# Patient Record
Sex: Female | Born: 1960 | Race: Black or African American | Hispanic: No | Marital: Single | Smoking: Never smoker
Health system: Southern US, Community
[De-identification: ages and names within clinical notes are randomized; demographics above are authoritative.]

## PROBLEM LIST (undated history)

## (undated) DIAGNOSIS — G43909 Migraine, unspecified, not intractable, without status migrainosus: Secondary | ICD-10-CM

## (undated) DIAGNOSIS — S8001XA Contusion of right knee, initial encounter: Secondary | ICD-10-CM

## (undated) DIAGNOSIS — F419 Anxiety disorder, unspecified: Secondary | ICD-10-CM

## (undated) DIAGNOSIS — E7800 Pure hypercholesterolemia, unspecified: Secondary | ICD-10-CM

## (undated) DIAGNOSIS — K219 Gastro-esophageal reflux disease without esophagitis: Secondary | ICD-10-CM

## (undated) DIAGNOSIS — I10 Essential (primary) hypertension: Secondary | ICD-10-CM

## (undated) HISTORY — PX: KNEE SURGERY: SHX244

## (undated) HISTORY — PX: HAND SURGERY: SHX662

## (undated) HISTORY — PX: ABDOMINAL HYSTERECTOMY: SHX81

## (undated) HISTORY — PX: CARPAL TUNNEL RELEASE: SHX101

## (undated) HISTORY — PX: CHOLECYSTECTOMY: SHX55

## (undated) HISTORY — PX: TONSILLECTOMY: SUR1361

---

## 2011-01-19 ENCOUNTER — Emergency Department (HOSPITAL_BASED_OUTPATIENT_CLINIC_OR_DEPARTMENT_OTHER)
Admission: EM | Admit: 2011-01-19 | Discharge: 2011-01-19 | Disposition: A | Discharge status: Home / Self Care | Payer: Medicaid Other | Attending: Emergency Medicine | Admitting: Emergency Medicine

## 2011-01-19 ENCOUNTER — Emergency Department (INDEPENDENT_AMBULATORY_CARE_PROVIDER_SITE_OTHER): Payer: Medicaid Other

## 2011-01-19 DIAGNOSIS — M1990 Unspecified osteoarthritis, unspecified site: Secondary | ICD-10-CM

## 2011-01-19 DIAGNOSIS — M25569 Pain in unspecified knee: Secondary | ICD-10-CM | POA: Insufficient documentation

## 2011-01-19 DIAGNOSIS — I10 Essential (primary) hypertension: Secondary | ICD-10-CM | POA: Insufficient documentation

## 2011-01-19 DIAGNOSIS — M1710 Unilateral primary osteoarthritis, unspecified knee: Secondary | ICD-10-CM

## 2011-01-19 DIAGNOSIS — E7800 Pure hypercholesterolemia, unspecified: Secondary | ICD-10-CM | POA: Insufficient documentation

## 2011-01-19 DIAGNOSIS — E119 Type 2 diabetes mellitus without complications: Secondary | ICD-10-CM | POA: Insufficient documentation

## 2011-01-19 HISTORY — DX: Essential (primary) hypertension: I10

## 2011-01-19 HISTORY — DX: Pure hypercholesterolemia, unspecified: E78.00

## 2011-01-19 MED ORDER — OXYCODONE-ACETAMINOPHEN 5-325 MG PO TABS
1.0000 | ORAL_TABLET | Freq: Once | ORAL | Status: AC
  Administered 2011-01-19: 1 via ORAL
  Filled 2011-01-19: qty 1

## 2011-01-19 MED ORDER — OXYCODONE-ACETAMINOPHEN 5-325 MG PO TABS: 2.0000 | ORAL_TABLET | ORAL | Status: AC | PRN

## 2011-01-19 NOTE — ED Provider Notes (Signed)
History     CSN: [id number] Arrival date & time: 01/19/2011  8:24 PM   First MD Initiated Contact with Patient 01/19/11 2035      Chief Complaint  Patient presents with  . Knee Injury   patient states she injured her knee, 3-4 days ago, when she "stepped wrong." Going down a step. She has been having persistent pain in the top of the left knee and diffusely in the left knee. Over that past few days. She is taking Tylenol with mild relief. She denies any numbness or tingling or weakness. She does have chronic arthritis in her right knee.  (Consider location/radiation/quality/duration/timing/severity/associated sxs/prior treatment) HPI  Past Medical History  Diagnosis Date  . High cholesterol   . Diabetes mellitus   . Hypertension     Past Surgical History  Procedure Date  . Knee surgery   . Tonsillectomy   . Carpal tunnel release   . Abdominal hysterectomy   . Cholecystectomy     No family history on file.  History  Substance Use Topics  . Smoking status: Never Smoker   . Smokeless tobacco: Not on file  . Alcohol Use: No    OB History    Grav Para Term Preterm Abortions TAB SAB Ect Mult Living                  Review of Systems  All other systems reviewed and are negative.    Allergies  Peanut oil  Home Medications   Current Outpatient Rx  Name Route Sig Dispense Refill  . ACETAMINOPHEN 500 MG PO TABS Oral Take 2,000 mg by mouth once.      Marland Kitchen LIPITOR PO Oral Take 1 tablet by mouth daily.      Marland Kitchen METFORMIN HCL 500 MG PO TABS Oral Take 500 mg by mouth daily with breakfast.        BP 157/95  Pulse 99  Temp(Src) 97.6 F (36.4 C) (Oral)  Resp 16  Ht 5\' 6"  (1.676 m)  Wt 262 lb (118.842 kg)  BMI 42.29 kg/m2  SpO2 100%  Physical Exam  Constitutional: She appears well-developed and well-nourished.  HENT:  Head: Normocephalic.  Eyes: Pupils are equal, round, and reactive to light.  Cardiovascular: Normal heart sounds.   Pulmonary/Chest: Breath sounds  normal.  Abdominal: Soft.  Musculoskeletal: Normal range of motion.  Neurological: She is alert.  Skin: Skin is warm and dry.    ED Course  Procedures (including critical care time)  Labs Reviewed - No data to display No results found.   No diagnosis found.    MDM  Pt is seen and examined;  Initial history and physical completed.  Will follow.          Antonia Jicha A. Patrica Duel, MD 01/19/11 2040

## 2011-01-19 NOTE — ED Notes (Signed)
Injured left knee 4 days ago-"stepped wrong"-no direct contact

## 2011-01-19 NOTE — ED Notes (Signed)
Pt d/c home with rx x 1 for percocet

## 2011-02-03 ENCOUNTER — Encounter (HOSPITAL_BASED_OUTPATIENT_CLINIC_OR_DEPARTMENT_OTHER): Payer: Self-pay | Admitting: Emergency Medicine

## 2011-02-03 ENCOUNTER — Emergency Department (HOSPITAL_BASED_OUTPATIENT_CLINIC_OR_DEPARTMENT_OTHER)
Admission: EM | Admit: 2011-02-03 | Discharge: 2011-02-03 | Disposition: A | Discharge status: Home / Self Care | Payer: Medicaid Other | Attending: Emergency Medicine | Admitting: Emergency Medicine

## 2011-02-03 DIAGNOSIS — M549 Dorsalgia, unspecified: Secondary | ICD-10-CM | POA: Insufficient documentation

## 2011-02-03 DIAGNOSIS — Z79899 Other long term (current) drug therapy: Secondary | ICD-10-CM | POA: Insufficient documentation

## 2011-02-03 DIAGNOSIS — I10 Essential (primary) hypertension: Secondary | ICD-10-CM | POA: Insufficient documentation

## 2011-02-03 DIAGNOSIS — E7800 Pure hypercholesterolemia, unspecified: Secondary | ICD-10-CM | POA: Insufficient documentation

## 2011-02-03 DIAGNOSIS — E119 Type 2 diabetes mellitus without complications: Secondary | ICD-10-CM | POA: Insufficient documentation

## 2011-02-03 MED ORDER — KETOROLAC TROMETHAMINE 60 MG/2ML IM SOLN
60.0000 mg | Freq: Once | INTRAMUSCULAR | Status: AC
  Administered 2011-02-03: 60 mg via INTRAMUSCULAR
  Filled 2011-02-03: qty 2

## 2011-02-03 MED ORDER — HYDROMORPHONE HCL PF 2 MG/ML IJ SOLN
2.0000 mg | Freq: Once | INTRAMUSCULAR | Status: AC
  Administered 2011-02-03: 2 mg via INTRAMUSCULAR
  Filled 2011-02-03: qty 1

## 2011-02-03 MED ORDER — OXYCODONE-ACETAMINOPHEN 5-325 MG PO TABS
1.0000 | ORAL_TABLET | Freq: Four times a day (QID) | ORAL | Status: AC | PRN
Start: 2011-02-03 — End: 2011-02-13

## 2011-02-03 NOTE — ED Provider Notes (Signed)
History     CSN: [id number] Arrival date & time: 02/03/2011  2:19 AM   First MD Initiated Contact with Patient 02/03/11 0305      Chief Complaint  Patient presents with  . Back Pain    (Consider location/radiation/quality/duration/timing/severity/associated sxs/prior treatment) Patient is a 50 y.o. female presenting with back pain. The history is provided by the patient.  Back Pain  This is a recurrent problem. The current episode started more than 2 days ago. The problem occurs constantly. The problem has been gradually worsening. The pain is associated with no known injury. The pain is present in the lumbar spine. The quality of the pain is described as stabbing, shooting and aching. The pain radiates to the right thigh. The pain is at a severity of 7/10. The pain is moderate. The symptoms are aggravated by bending, twisting and certain positions. The pain is the same all the time. Stiffness is present all day. Associated symptoms include leg pain. Pertinent negatives include no fever, no numbness, no abdominal pain, no bowel incontinence, no bladder incontinence, no dysuria, no paresthesias, no paresis and no weakness.    Past Medical History  Diagnosis Date  . High cholesterol   . Diabetes mellitus   . Hypertension   . Gout     Past Surgical History  Procedure Date  . Knee surgery   . Tonsillectomy   . Carpal tunnel release   . Abdominal hysterectomy   . Cholecystectomy     No family history on file.  History  Substance Use Topics  . Smoking status: Never Smoker   . Smokeless tobacco: Not on file  . Alcohol Use: No    OB History    Grav Para Term Preterm Abortions TAB SAB Ect Mult Living                  Review of Systems  Constitutional: Negative for fever.  Gastrointestinal: Negative for abdominal pain and bowel incontinence.  Genitourinary: Negative for bladder incontinence and dysuria.  Musculoskeletal: Positive for back pain.  Neurological: Negative  for weakness, numbness and paresthesias.  All other systems reviewed and are negative.    Allergies  Peanut oil  Home Medications   Current Outpatient Rx  Name Route Sig Dispense Refill  . COLCHICINE 0.6 MG PO TABS Oral Take 0.6 mg by mouth daily.      . ACETAMINOPHEN 500 MG PO TABS Oral Take 2,000 mg by mouth once.      Marland Kitchen LIPITOR PO Oral Take 1 tablet by mouth daily.      Marland Kitchen METFORMIN HCL 500 MG PO TABS Oral Take 500 mg by mouth daily with breakfast.      . OXYCODONE-ACETAMINOPHEN 5-325 MG PO TABS Oral Take 1-2 tablets by mouth every 6 (six) hours as needed for pain. 15 tablet 0    BP 145/63  Pulse 77  Temp(Src) 97.4 F (36.3 C) (Oral)  Resp 18  SpO2 100%  Physical Exam  Nursing note and vitals reviewed. Constitutional: She is oriented to person, place, and time. She appears well-developed and well-nourished. She appears distressed.  HENT:  Head: Normocephalic and atraumatic.  Mouth/Throat: Oropharynx is clear and moist.  Eyes: EOM are normal. Pupils are equal, round, and reactive to light.  Neck: Normal range of motion. Neck supple. No spinous process tenderness and no muscular tenderness present. No rigidity. Normal range of motion present.  Cardiovascular: Normal rate, regular rhythm, normal heart sounds and intact distal pulses.   No murmur  heard. Pulmonary/Chest: Effort normal and breath sounds normal. She has no wheezes. She has no rales.  Abdominal: Soft. She exhibits no distension. There is no tenderness. There is no CVA tenderness.  Musculoskeletal: She exhibits no tenderness.       Lumbar back: She exhibits decreased range of motion, tenderness and pain. She exhibits no swelling, no deformity and normal pulse.  Neurological: She is alert and oriented to person, place, and time. Coordination normal.  Reflex Scores:      Patellar reflexes are 1+ on the right side and 1+ on the left side. Skin: Skin is warm and dry. No rash noted.  Psychiatric: She has a normal mood  and affect.    ED Course  Procedures (including critical care time)  Labs Reviewed - No data to display No results found.   1. Back pain       MDM   Pt with gradual onset of back pain suggestive of radiculopathy.  No neurovascular compromise and no incontinence.  Pt has no infectious sx, hx of CA  or other red flags concerning for pathologic back pain.  Pt is able to ambulate but is painful.  Normal strength and reflexes on exam.  Denies trauma. Will give pt pain control and to return for developement of above sx.        Gwyneth Sprout, MD 02/03/11 [phone]

## 2011-02-03 NOTE — ED Notes (Signed)
Pt c/o lower back pain x several days, no known injury

## 2011-04-24 ENCOUNTER — Emergency Department (INDEPENDENT_AMBULATORY_CARE_PROVIDER_SITE_OTHER): Payer: Medicaid Other

## 2011-04-24 ENCOUNTER — Encounter (HOSPITAL_BASED_OUTPATIENT_CLINIC_OR_DEPARTMENT_OTHER): Payer: Self-pay

## 2011-04-24 ENCOUNTER — Emergency Department (HOSPITAL_BASED_OUTPATIENT_CLINIC_OR_DEPARTMENT_OTHER)
Admission: EM | Admit: 2011-04-24 | Discharge: 2011-04-24 | Disposition: A | Discharge status: Home / Self Care | Payer: Medicaid Other | Attending: Emergency Medicine | Admitting: Emergency Medicine

## 2011-04-24 DIAGNOSIS — M79609 Pain in unspecified limb: Secondary | ICD-10-CM

## 2011-04-24 DIAGNOSIS — I10 Essential (primary) hypertension: Secondary | ICD-10-CM | POA: Insufficient documentation

## 2011-04-24 DIAGNOSIS — S63501A Unspecified sprain of right wrist, initial encounter: Secondary | ICD-10-CM

## 2011-04-24 DIAGNOSIS — X500XXA Overexertion from strenuous movement or load, initial encounter: Secondary | ICD-10-CM | POA: Insufficient documentation

## 2011-04-24 DIAGNOSIS — E7800 Pure hypercholesterolemia, unspecified: Secondary | ICD-10-CM | POA: Insufficient documentation

## 2011-04-24 DIAGNOSIS — IMO0002 Reserved for concepts with insufficient information to code with codable children: Secondary | ICD-10-CM | POA: Insufficient documentation

## 2011-04-24 DIAGNOSIS — Y92009 Unspecified place in unspecified non-institutional (private) residence as the place of occurrence of the external cause: Secondary | ICD-10-CM | POA: Insufficient documentation

## 2011-04-24 DIAGNOSIS — X58XXXA Exposure to other specified factors, initial encounter: Secondary | ICD-10-CM

## 2011-04-24 DIAGNOSIS — S59909A Unspecified injury of unspecified elbow, initial encounter: Secondary | ICD-10-CM

## 2011-04-24 DIAGNOSIS — M25539 Pain in unspecified wrist: Secondary | ICD-10-CM

## 2011-04-24 DIAGNOSIS — E119 Type 2 diabetes mellitus without complications: Secondary | ICD-10-CM | POA: Insufficient documentation

## 2011-04-24 MED ORDER — HYDROCODONE-ACETAMINOPHEN 5-325 MG PO TABS: 2.0000 | ORAL_TABLET | ORAL | Status: AC | PRN

## 2011-04-24 NOTE — ED Notes (Signed)
Pt states that she has severe pain in her R lower forearm which is radiating to her R hand.  Pt states that last week she was lifting boxes at her home and injured this arm.  Pt states that she has been putting rubbing alcohol on it to help with the pain.

## 2011-04-24 NOTE — ED Provider Notes (Signed)
History     CSN: [id number]  Arrival date & time 04/24/11  1558   First MD Initiated Contact with Patient 04/24/11 1842      Chief Complaint  Patient presents with  . Arm Injury    (Consider location/radiation/quality/duration/timing/severity/associated sxs/prior treatment) Patient is a 51 y.o. female presenting with arm injury. The history is provided by the patient.  Arm Injury  The incident occurred more than 2 days ago. The incident occurred at home. The injury mechanism was a pulled limb and a pulled muscle. There is an injury to the right forearm and right hand. Pertinent negatives include no inability to bear weight. There have been no prior injuries to these areas. She is right-handed. Her tetanus status is UTD. There were no sick contacts.  Pt reports she was lifting boxes and now has pain in her wrist and forearm.  Pt complains of pain with movement.  Pt reports she thinks she may have pulled a muscle  Past Medical History  Diagnosis Date  . High cholesterol   . Diabetes mellitus   . Hypertension   . Gout     Past Surgical History  Procedure Date  . Knee surgery   . Tonsillectomy   . Carpal tunnel release   . Abdominal hysterectomy   . Cholecystectomy   . Hand surgery     History reviewed. No pertinent family history.  History  Substance Use Topics  . Smoking status: Never Smoker   . Smokeless tobacco: Not on file  . Alcohol Use: No    OB History    Grav Para Term Preterm Abortions TAB SAB Ect Mult Living                  Review of Systems  Musculoskeletal: Positive for myalgias and joint swelling.  All other systems reviewed and are negative.    Allergies  Peanut oil  Home Medications   Current Outpatient Rx  Name Route Sig Dispense Refill  . ACETAMINOPHEN 500 MG PO TABS Oral Take 2,000 mg by mouth 2 (two) times daily as needed. For pain    . LIPITOR PO Oral Take 1 tablet by mouth daily.      . COLCHICINE 0.6 MG PO TABS Oral Take 0.6 mg  by mouth daily.      Marland Kitchen METFORMIN HCL 500 MG PO TABS Oral Take 500 mg by mouth daily with breakfast.      . HYDROCODONE-ACETAMINOPHEN 5-325 MG PO TABS Oral Take 2 tablets by mouth every 4 (four) hours as needed for pain. 10 tablet 0    BP 154/85  Pulse 89  Temp(Src) 98.1 F (36.7 C) (Oral)  Resp 19  Ht 5\' 6"  (1.676 m)  Wt 264 lb (119.75 kg)  BMI 42.61 kg/m2  SpO2 100%  Physical Exam  Nursing note and vitals reviewed. Constitutional: She is oriented to person, place, and time. She appears well-developed and well-nourished.  HENT:  Head: Normocephalic and atraumatic.  Musculoskeletal: She exhibits edema and tenderness.       Pain with pronation and supination of forearm,  Elbow and wrist nontender  Neurological: She is alert and oriented to person, place, and time. She has normal reflexes.  Skin: Skin is warm.  Psychiatric: She has a normal mood and affect.    ED Course  Procedures (including critical care time)  Labs Reviewed - No data to display Dg Forearm Right  04/24/2011  *RADIOLOGY REPORT*  Clinical Data: Pain  RIGHT FOREARM - 2 VIEW  Comparison: None.  Findings: Two views of the right forearm submitted.  No acute fracture or subluxation.  No radiopaque foreign body.  IMPRESSION: No acute fracture or subluxation.  Original Report Authenticated By: Natasha Mead, M.D.   Dg Wrist Complete Right  04/24/2011  *RADIOLOGY REPORT*  Clinical Data: Pain, injury  RIGHT WRIST - COMPLETE 3+ VIEW  Comparison: None.  Findings: Four views of the right wrist submitted.  No acute fracture or subluxation.  No radiopaque foreign body.  IMPRESSION: No acute fracture or subluxation.  Original Report Authenticated By: Natasha Mead, M.D.     1. Sprain of right forearm       MDM    Pt placed in sling.  I advised folllow up with Dr. Pearletha Forge for recheck.      Langston Masker, [place] 04/24/11 2241

## 2011-04-25 NOTE — ED Provider Notes (Signed)
Medical screening examination/treatment/procedure(s) were performed by non-physician practitioner and as supervising physician I was immediately available for consultation/collaboration.   Leigh-Ann Jaicob Dia, MD 04/25/11 0055 

## 2013-01-23 ENCOUNTER — Emergency Department (HOSPITAL_BASED_OUTPATIENT_CLINIC_OR_DEPARTMENT_OTHER)
Admission: EM | Admit: 2013-01-23 | Discharge: 2013-01-23 | Disposition: A | Discharge status: Home / Self Care | Payer: Medicaid Other | Attending: Emergency Medicine | Admitting: Emergency Medicine

## 2013-01-23 ENCOUNTER — Encounter (HOSPITAL_BASED_OUTPATIENT_CLINIC_OR_DEPARTMENT_OTHER): Payer: Self-pay | Admitting: Emergency Medicine

## 2013-01-23 DIAGNOSIS — M109 Gout, unspecified: Secondary | ICD-10-CM | POA: Insufficient documentation

## 2013-01-23 DIAGNOSIS — Z79899 Other long term (current) drug therapy: Secondary | ICD-10-CM | POA: Insufficient documentation

## 2013-01-23 DIAGNOSIS — G43909 Migraine, unspecified, not intractable, without status migrainosus: Secondary | ICD-10-CM | POA: Insufficient documentation

## 2013-01-23 DIAGNOSIS — I10 Essential (primary) hypertension: Secondary | ICD-10-CM | POA: Insufficient documentation

## 2013-01-23 DIAGNOSIS — E119 Type 2 diabetes mellitus without complications: Secondary | ICD-10-CM | POA: Insufficient documentation

## 2013-01-23 DIAGNOSIS — Z791 Long term (current) use of non-steroidal anti-inflammatories (NSAID): Secondary | ICD-10-CM | POA: Insufficient documentation

## 2013-01-23 DIAGNOSIS — E7800 Pure hypercholesterolemia, unspecified: Secondary | ICD-10-CM | POA: Insufficient documentation

## 2013-01-23 HISTORY — DX: Migraine, unspecified, not intractable, without status migrainosus: G43.909

## 2013-01-23 MED ORDER — SODIUM CHLORIDE 0.9 % IV BOLUS (SEPSIS)
1000.0000 mL | Freq: Once | INTRAVENOUS | Status: AC
  Administered 2013-01-23: 1000 mL via INTRAVENOUS

## 2013-01-23 MED ORDER — METOCLOPRAMIDE HCL 5 MG/ML IJ SOLN
10.0000 mg | Freq: Once | INTRAMUSCULAR | Status: AC
  Administered 2013-01-23: 10 mg via INTRAVENOUS
  Filled 2013-01-23: qty 2

## 2013-01-23 MED ORDER — KETOROLAC TROMETHAMINE 30 MG/ML IJ SOLN
30.0000 mg | Freq: Once | INTRAMUSCULAR | Status: AC
  Administered 2013-01-23: 30 mg via INTRAVENOUS
  Filled 2013-01-23: qty 1

## 2013-01-23 NOTE — ED Provider Notes (Signed)
CSN: [id number]     Arrival date & time 01/23/13  1739 History  This chart was scribed for Tracey Castillo B. Bernette Mayers, MD by Tracey Castillo, ED Scribe. This patient was seen in room MH07/MH07 and the patient's care was started at 6:00 PM.   Chief Complaint  Patient presents with  . Migraine    The history is provided by the patient. No language interpreter was used.    HPI Comments: Tracey Castillo is a 52 y.o. female who presents to the Emergency Department complaining of right sided HA with associated nausea, lightheadedness and photophobia that started gradually 4 days ago and has been persistent. She also reports a recent cough that exacerbates her HA. She has a h/o similar migraines, episodes usually occur 3 times a year; however, she states that she normally does not have the lightheadedness. She states that her last similar HA was 5 months ago. She sees her PCP for her migraines but denies having any prescriptions. She denies any prior ED visits for her HAs stating that they are mostly controlled with OTC medications. She denies any fevers.   Past Medical History  Diagnosis Date  . High cholesterol   . Diabetes mellitus   . Hypertension   . Gout   . Migraine    Past Surgical History  Procedure Laterality Date  . Knee surgery    . Tonsillectomy    . Carpal tunnel release    . Abdominal hysterectomy    . Cholecystectomy    . Hand surgery     History reviewed. No pertinent family history. History  Substance Use Topics  . Smoking status: Never Smoker   . Smokeless tobacco: Not on file  . Alcohol Use: No   No OB history provided.  Review of Systems  A complete 10 system review of systems was obtained and all systems are negative except as noted in the HPI and PMH.   Allergies  Peanut oil  Home Medications   Current Outpatient Rx  Name  Route  Sig  Dispense  Refill  . allopurinol (ZYLOPRIM) 300 MG tablet   Oral   Take 300 mg by mouth daily.         .  cyclobenzaprine (FLEXERIL) 10 MG tablet   Oral   Take 10 mg by mouth 3 (three) times daily as needed for muscle spasms.         Marland Kitchen glyBURIDE-metformin (GLUCOVANCE) 2.5-500 MG per tablet   Oral   Take 1 tablet by mouth daily with breakfast.         . meloxicam (MOBIC) 7.5 MG tablet   Oral   Take 7.5 mg by mouth daily.         Marland Kitchen omeprazole (PRILOSEC) 40 MG capsule   Oral   Take 40 mg by mouth daily.         . pravastatin (PRAVACHOL) 40 MG tablet   Oral   Take 40 mg by mouth daily.         Marland Kitchen zolpidem (AMBIEN CR) 12.5 MG CR tablet   Oral   Take 12.5 mg by mouth at bedtime as needed for sleep.         Marland Kitchen acetaminophen (TYLENOL) 500 MG tablet   Oral   Take 2,000 mg by mouth 2 (two) times daily as needed. For pain         . Atorvastatin Calcium (LIPITOR PO)   Oral   Take 1 tablet by mouth daily.           Marland Kitchen  colchicine 0.6 MG tablet   Oral   Take 0.6 mg by mouth daily.           . metFORMIN (GLUCOPHAGE) 500 MG tablet   Oral   Take 500 mg by mouth daily with breakfast.            Triage Vitals: BP 166/96  Pulse 115  Temp(Src) 98.2 F (36.8 C) (Oral)  Resp 16  Ht 5\' 6"  (1.676 m)  Wt 295 lb (133.811 kg)  BMI 47.64 kg/m2  SpO2 100%  Physical Exam  Nursing note and vitals reviewed. Constitutional: She is oriented to person, place, and time. She appears well-developed and well-nourished.  HENT:  Head: Normocephalic and atraumatic.  Eyes: EOM are normal. Pupils are equal, round, and reactive to light.  Neck: Normal range of motion. Neck supple.  Cardiovascular: Normal rate, normal heart sounds and intact distal pulses.   Pulmonary/Chest: Effort normal and breath sounds normal.  Abdominal: Bowel sounds are normal. She exhibits no distension. There is no tenderness.  Musculoskeletal: Normal range of motion. She exhibits no edema and no tenderness.  Neurological: She is alert and oriented to person, place, and time. She has normal strength. No cranial  nerve deficit or sensory deficit.  Skin: Skin is warm and dry. No rash noted.  Psychiatric: She has a normal mood and affect.    ED Course  Procedures (including critical care time)  Medications  sodium chloride 0.9 % bolus 1,000 mL (not administered)  ketorolac (TORADOL) 30 MG/ML injection 30 mg (not administered)  metoCLOPramide (REGLAN) injection 10 mg (not administered)    DIAGNOSTIC STUDIES: Oxygen Saturation is 100% on room air, normal by my interpretation.    COORDINATION OF CARE: 6:05 PM-Discussed treatment plan which includes medications and IV fluids with pt at bedside and pt agreed to plan.   Labs Review Labs Reviewed - No data to display Imaging Review No results found.  EKG Interpretation   None       MDM   1. Migraine     No red flags for serious cause of headache. Will give meds and IVF and reassess.   7:24 PM Pt feeling better, pain improved and she would like to go home.   I personally performed the services described in this documentation, which was scribed in my presence. The recorded information has been reviewed and is accurate.       Melynda Krzywicki B. Bernette Mayers, MD 01/23/13 1925

## 2013-01-23 NOTE — ED Notes (Addendum)
C/o migraine x 4 days,  w nausea,  Has taken advil with  No relief,  Light sensitive to some degree

## 2013-01-23 NOTE — ED Notes (Signed)
Pt c/o migraine x4 days

## 2013-02-22 ENCOUNTER — Emergency Department (HOSPITAL_BASED_OUTPATIENT_CLINIC_OR_DEPARTMENT_OTHER)
Admission: EM | Admit: 2013-02-22 | Discharge: 2013-02-22 | Disposition: A | Discharge status: Home / Self Care | Payer: Medicaid Other | Attending: Emergency Medicine | Admitting: Emergency Medicine

## 2013-02-22 ENCOUNTER — Encounter (HOSPITAL_BASED_OUTPATIENT_CLINIC_OR_DEPARTMENT_OTHER): Payer: Self-pay | Admitting: Emergency Medicine

## 2013-02-22 DIAGNOSIS — E119 Type 2 diabetes mellitus without complications: Secondary | ICD-10-CM | POA: Insufficient documentation

## 2013-02-22 DIAGNOSIS — Z791 Long term (current) use of non-steroidal anti-inflammatories (NSAID): Secondary | ICD-10-CM | POA: Insufficient documentation

## 2013-02-22 DIAGNOSIS — J069 Acute upper respiratory infection, unspecified: Secondary | ICD-10-CM | POA: Insufficient documentation

## 2013-02-22 DIAGNOSIS — M109 Gout, unspecified: Secondary | ICD-10-CM | POA: Insufficient documentation

## 2013-02-22 DIAGNOSIS — I10 Essential (primary) hypertension: Secondary | ICD-10-CM | POA: Insufficient documentation

## 2013-02-22 DIAGNOSIS — Z79899 Other long term (current) drug therapy: Secondary | ICD-10-CM | POA: Insufficient documentation

## 2013-02-22 DIAGNOSIS — J019 Acute sinusitis, unspecified: Secondary | ICD-10-CM | POA: Insufficient documentation

## 2013-02-22 DIAGNOSIS — E7800 Pure hypercholesterolemia, unspecified: Secondary | ICD-10-CM | POA: Insufficient documentation

## 2013-02-22 DIAGNOSIS — G43909 Migraine, unspecified, not intractable, without status migrainosus: Secondary | ICD-10-CM | POA: Insufficient documentation

## 2013-02-22 MED ORDER — AZITHROMYCIN 250 MG PO TABS: 250.0000 mg | ORAL_TABLET | Freq: Every day | ORAL | Status: DC

## 2013-02-22 NOTE — ED Notes (Signed)
Pt states that she has been having URI symptoms for the past several days. Pt states that she has been having dry cough as well as sore throat and HA. Pt denies respiratory problems.

## 2013-02-22 NOTE — ED Provider Notes (Signed)
CSN: [id number]     Arrival date & time 02/22/13  1800 History   First MD Initiated Contact with Patient 02/22/13 1811     Chief Complaint  Patient presents with  . URI   (Consider location/radiation/quality/duration/timing/severity/associated sxs/prior Treatment) HPI Comments: Patient presents today with a chief complaint of productive cough, sinus pressure, post nasal drip, and congestion.  Symptoms have been present for the past week and are gradually worsening.  She reports that she has been taking Nyquil and Robitussin for symptoms without relief.  She denies chest pain or SOB.  She denies fever or chills.  No history of COPD or Asthma.  She does not smoke.    Patient is a 52 y.o. female presenting with URI. The history is provided by the patient.  URI   Past Medical History  Diagnosis Date  . High cholesterol   . Diabetes mellitus   . Hypertension   . Gout   . Migraine    Past Surgical History  Procedure Laterality Date  . Knee surgery    . Tonsillectomy    . Carpal tunnel release    . Abdominal hysterectomy    . Cholecystectomy    . Hand surgery     History reviewed. No pertinent family history. History  Substance Use Topics  . Smoking status: Never Smoker   . Smokeless tobacco: Not on file  . Alcohol Use: No   OB History   Grav Para Term Preterm Abortions TAB SAB Ect Mult Living                 Review of Systems  All other systems reviewed and are negative.    Allergies  Peanut oil  Home Medications   Current Outpatient Rx  Name  Route  Sig  Dispense  Refill  . acetaminophen (TYLENOL) 500 MG tablet   Oral   Take 2,000 mg by mouth 2 (two) times daily as needed. For pain         . allopurinol (ZYLOPRIM) 300 MG tablet   Oral   Take 300 mg by mouth daily.         . Atorvastatin Calcium (LIPITOR PO)   Oral   Take 1 tablet by mouth daily.           . colchicine 0.6 MG tablet   Oral   Take 0.6 mg by mouth daily.           .  cyclobenzaprine (FLEXERIL) 10 MG tablet   Oral   Take 10 mg by mouth 3 (three) times daily as needed for muscle spasms.         Marland Kitchen glyBURIDE-metformin (GLUCOVANCE) 2.5-500 MG per tablet   Oral   Take 1 tablet by mouth daily with breakfast.         . meloxicam (MOBIC) 7.5 MG tablet   Oral   Take 7.5 mg by mouth daily.         . metFORMIN (GLUCOPHAGE) 500 MG tablet   Oral   Take 500 mg by mouth daily with breakfast.           . omeprazole (PRILOSEC) 40 MG capsule   Oral   Take 40 mg by mouth daily.         . pravastatin (PRAVACHOL) 40 MG tablet   Oral   Take 40 mg by mouth daily.         Marland Kitchen zolpidem (AMBIEN CR) 12.5 MG CR tablet   Oral   Take  12.5 mg by mouth at bedtime as needed for sleep.          BP 168/107  Pulse 103  Temp(Src) 97.9 F (36.6 C) (Oral)  Resp 16  Ht 5\' 6"  (1.676 m)  Wt 299 lb (135.626 kg)  BMI 48.28 kg/m2  SpO2 100% Physical Exam  Nursing note and vitals reviewed. Constitutional: She appears well-developed and well-nourished.  HENT:  Head: Normocephalic and atraumatic.  Right Ear: Tympanic membrane and ear canal normal.  Left Ear: Tympanic membrane and ear canal normal.  Nose: Mucosal edema and rhinorrhea present. Right sinus exhibits frontal sinus tenderness. Right sinus exhibits no maxillary sinus tenderness. Left sinus exhibits frontal sinus tenderness. Left sinus exhibits no maxillary sinus tenderness.  Mouth/Throat: Uvula is midline, oropharynx is clear and moist and mucous membranes are normal.  Neck: Normal range of motion. Neck supple.  Cardiovascular: Normal rate, regular rhythm and normal heart sounds.   Pulmonary/Chest: Effort normal and breath sounds normal. No respiratory distress. She has no wheezes. She has no rales.  Neurological: She is alert.  Skin: Skin is warm and dry.  Psychiatric: She has a normal mood and affect.    ED Course  Procedures (including critical care time) Labs Review Labs Reviewed - No data to  display Imaging Review No results found.  EKG Interpretation   None       MDM  No diagnosis found. Patient with symptoms consistent with Acute Sinusitis.  Pulse ox 100 on RA.  No signs of respiratory distress.  Patient given antibiotic to treat Acute Sinusitis.  Patient stable for discharge.  Return precautions given.    Santiago Glad, PA-C 02/25/13 [phone]

## 2013-02-22 NOTE — ED Notes (Signed)
Pt c/o URI symptoms x 1 week 

## 2013-03-01 NOTE — ED Provider Notes (Signed)
Medical screening examination/treatment/procedure(s) were performed by non-physician practitioner and as supervising physician I was immediately available for consultation/collaboration.  EKG Interpretation   None         Gwyneth Sprout, MD 03/01/13 [phone]

## 2013-03-02 ENCOUNTER — Emergency Department (HOSPITAL_BASED_OUTPATIENT_CLINIC_OR_DEPARTMENT_OTHER): Payer: Medicaid Other

## 2013-03-02 ENCOUNTER — Emergency Department (HOSPITAL_BASED_OUTPATIENT_CLINIC_OR_DEPARTMENT_OTHER)
Admission: EM | Admit: 2013-03-02 | Discharge: 2013-03-02 | Disposition: A | Discharge status: Home / Self Care | Payer: Medicaid Other | Attending: Emergency Medicine | Admitting: Emergency Medicine

## 2013-03-02 ENCOUNTER — Encounter (HOSPITAL_BASED_OUTPATIENT_CLINIC_OR_DEPARTMENT_OTHER): Payer: Self-pay | Admitting: Emergency Medicine

## 2013-03-02 DIAGNOSIS — M25562 Pain in left knee: Secondary | ICD-10-CM

## 2013-03-02 DIAGNOSIS — E119 Type 2 diabetes mellitus without complications: Secondary | ICD-10-CM | POA: Insufficient documentation

## 2013-03-02 DIAGNOSIS — Z792 Long term (current) use of antibiotics: Secondary | ICD-10-CM | POA: Insufficient documentation

## 2013-03-02 DIAGNOSIS — G43909 Migraine, unspecified, not intractable, without status migrainosus: Secondary | ICD-10-CM | POA: Insufficient documentation

## 2013-03-02 DIAGNOSIS — Z79899 Other long term (current) drug therapy: Secondary | ICD-10-CM | POA: Insufficient documentation

## 2013-03-02 DIAGNOSIS — M109 Gout, unspecified: Secondary | ICD-10-CM | POA: Insufficient documentation

## 2013-03-02 DIAGNOSIS — E7800 Pure hypercholesterolemia, unspecified: Secondary | ICD-10-CM | POA: Insufficient documentation

## 2013-03-02 DIAGNOSIS — Z791 Long term (current) use of non-steroidal anti-inflammatories (NSAID): Secondary | ICD-10-CM | POA: Insufficient documentation

## 2013-03-02 DIAGNOSIS — I10 Essential (primary) hypertension: Secondary | ICD-10-CM | POA: Insufficient documentation

## 2013-03-02 DIAGNOSIS — M25569 Pain in unspecified knee: Secondary | ICD-10-CM | POA: Insufficient documentation

## 2013-03-02 MED ORDER — HYDROCODONE-ACETAMINOPHEN 5-325 MG PO TABS: 2.0000 | ORAL_TABLET | ORAL | Status: DC | PRN

## 2013-03-02 NOTE — ED Notes (Signed)
Pt reports (L) knee pain x 2 weeks.  deneis injury. No swelling, bruising noted.  Pt ambulatory.

## 2013-03-02 NOTE — ED Provider Notes (Signed)
CSN: [id number]     Arrival date & time 03/02/13  4098 History   First MD Initiated Contact with Patient 03/02/13 [phone]     Chief Complaint  Patient presents with  . Knee Pain   (Consider location/radiation/quality/duration/timing/severity/associated sxs/prior Treatment) HPI Comments: Patient presents with a two-week history of worsening throbbing left knee pain. She denies any known injury. She has arthritis in her right knee but hasn't had any significant problems with her left knee the past. She does have a history of a torn ligament in her left knee which required surgery several years ago. This was done in [facility]. She denies any significant swelling to the knee. She states it's worse with ambulation. She takes Mobic home which she's been using without significant relief.  Patient is a 52 y.o. female presenting with knee pain.  Knee Pain Associated symptoms: no back pain, no fever and no neck pain     Past Medical History  Diagnosis Date  . High cholesterol   . Diabetes mellitus   . Hypertension   . Gout   . Migraine    Past Surgical History  Procedure Laterality Date  . Knee surgery    . Tonsillectomy    . Carpal tunnel release    . Abdominal hysterectomy    . Cholecystectomy    . Hand surgery     History reviewed. No pertinent family history. History  Substance Use Topics  . Smoking status: Never Smoker   . Smokeless tobacco: Not on file  . Alcohol Use: No   OB History   Grav Para Term Preterm Abortions TAB SAB Ect Mult Living                 Review of Systems  Constitutional: Negative for fever.  Gastrointestinal: Negative for nausea and vomiting.  Musculoskeletal: Positive for arthralgias. Negative for back pain, joint swelling and neck pain.  Skin: Negative for wound.  Neurological: Negative for weakness, numbness and headaches.    Allergies  Peanut oil  Home Medications   Current Outpatient Rx  Name  Route  Sig  Dispense  Refill  .  acetaminophen (TYLENOL) 500 MG tablet   Oral   Take 2,000 mg by mouth 2 (two) times daily as needed. For pain         . allopurinol (ZYLOPRIM) 300 MG tablet   Oral   Take 300 mg by mouth daily.         . Atorvastatin Calcium (LIPITOR PO)   Oral   Take 1 tablet by mouth daily.           Marland Kitchen azithromycin (ZITHROMAX) 250 MG tablet   Oral   Take 1 tablet (250 mg total) by mouth daily. Take first 2 tablets together, then 1 every day until finished.   6 tablet   0   . colchicine 0.6 MG tablet   Oral   Take 0.6 mg by mouth daily.           . cyclobenzaprine (FLEXERIL) 10 MG tablet   Oral   Take 10 mg by mouth 3 (three) times daily as needed for muscle spasms.         Marland Kitchen glyBURIDE-metformin (GLUCOVANCE) 2.5-500 MG per tablet   Oral   Take 1 tablet by mouth daily with breakfast.         . HYDROcodone-acetaminophen (NORCO/VICODIN) 5-325 MG per tablet   Oral   Take 2 tablets by mouth every 4 (four) hours as needed.  15 tablet   0   . meloxicam (MOBIC) 7.5 MG tablet   Oral   Take 7.5 mg by mouth daily.         . metFORMIN (GLUCOPHAGE) 500 MG tablet   Oral   Take 500 mg by mouth daily with breakfast.           . omeprazole (PRILOSEC) 40 MG capsule   Oral   Take 40 mg by mouth daily.         . pravastatin (PRAVACHOL) 40 MG tablet   Oral   Take 40 mg by mouth daily.         Marland Kitchen zolpidem (AMBIEN CR) 12.5 MG CR tablet   Oral   Take 12.5 mg by mouth at bedtime as needed for sleep.          BP 193/89  Pulse 100  Temp(Src) 97.5 F (36.4 C) (Oral)  Resp 18  Ht 5\' 6"  (1.676 m)  Wt 299 lb (135.626 kg)  BMI 48.28 kg/m2 Physical Exam  Constitutional: She is oriented to person, place, and time. She appears well-developed and well-nourished.  HENT:  Head: Normocephalic and atraumatic.  Neck: Normal range of motion. Neck supple.  Cardiovascular: Normal rate.   Pulmonary/Chest: Effort normal.  Musculoskeletal: She exhibits tenderness. She exhibits no  edema.  There is no significant swelling or effusion to the knee. There's no warmth to the knee. She has pain on range of motion and palpation of the left knee. The pain is mostly at the quadriceps tendon and along the joint lines. There some crepitus on range of motion of the knee but the ligaments appeared be grossly intact to Lachman maneuver as well as valgus and varus stress. There is no significant pain to the hip or the ankle. She's neurovascularly intact.  Neurological: She is alert and oriented to person, place, and time.  Skin: Skin is warm and dry.  Psychiatric: She has a normal mood and affect.    ED Course  Procedures (including critical care time) Labs Review Labs Reviewed - No data to display Imaging Review Dg Knee Complete 4 Views Left  03/02/2013   CLINICAL DATA:  Five-day history of acute knee pain. , history of previous left knee surgery  EXAM: LEFT KNEE - COMPLETE 4+ VIEW  COMPARISON:  Knee series of January 19, 2011.  FINDINGS: The bones of the left knee appear adequately mineralized for age. There is chronic beaking of the tibial spines. There is a small osteophyte noted from the periphery of the medial femoral condyle. The tibial plateaus appear intact. The proximal fibula exhibits no acute abnormality. There are degenerative changes of the patellofemoral joint with the femoral and patellar osteophytes. No definite joint effusion is demonstrated  IMPRESSION: 1. There is no acute fracture nor dislocation of the left knee. 2. There is moderate degenerative change involving the patellofemoral joint and mild degenerative change of both medial and lateral joint compartments.   Electronically Signed   By: David  [place]   On: 03/02/2013 09:29    EKG Interpretation   None       MDM   1. Knee pain, acute, left    Patient's pain is likely related to degenerative joint disease. She was advised in ice and elevation. She's currently taking Mobic home. She is given a prescription  for Vicodin as well. She was advised to followup with her primary care physician or Dr. Pearletha Forge if her symptoms are not improving.    Rolan Bucco,  MD 03/02/13 [phone]

## 2013-05-05 ENCOUNTER — Encounter (HOSPITAL_BASED_OUTPATIENT_CLINIC_OR_DEPARTMENT_OTHER): Payer: Self-pay | Admitting: Emergency Medicine

## 2013-05-05 ENCOUNTER — Emergency Department (HOSPITAL_BASED_OUTPATIENT_CLINIC_OR_DEPARTMENT_OTHER)
Admission: EM | Admit: 2013-05-05 | Discharge: 2013-05-05 | Disposition: A | Discharge status: Home / Self Care | Payer: Medicaid Other | Attending: Emergency Medicine | Admitting: Emergency Medicine

## 2013-05-05 ENCOUNTER — Emergency Department (HOSPITAL_BASED_OUTPATIENT_CLINIC_OR_DEPARTMENT_OTHER): Payer: Medicaid Other

## 2013-05-05 DIAGNOSIS — Y92410 Unspecified street and highway as the place of occurrence of the external cause: Secondary | ICD-10-CM | POA: Insufficient documentation

## 2013-05-05 DIAGNOSIS — Z79899 Other long term (current) drug therapy: Secondary | ICD-10-CM | POA: Insufficient documentation

## 2013-05-05 DIAGNOSIS — Z792 Long term (current) use of antibiotics: Secondary | ICD-10-CM | POA: Insufficient documentation

## 2013-05-05 DIAGNOSIS — E119 Type 2 diabetes mellitus without complications: Secondary | ICD-10-CM | POA: Insufficient documentation

## 2013-05-05 DIAGNOSIS — G43909 Migraine, unspecified, not intractable, without status migrainosus: Secondary | ICD-10-CM | POA: Insufficient documentation

## 2013-05-05 DIAGNOSIS — S8990XA Unspecified injury of unspecified lower leg, initial encounter: Secondary | ICD-10-CM | POA: Insufficient documentation

## 2013-05-05 DIAGNOSIS — E7800 Pure hypercholesterolemia, unspecified: Secondary | ICD-10-CM | POA: Insufficient documentation

## 2013-05-05 DIAGNOSIS — Y9389 Activity, other specified: Secondary | ICD-10-CM | POA: Insufficient documentation

## 2013-05-05 DIAGNOSIS — M25561 Pain in right knee: Secondary | ICD-10-CM

## 2013-05-05 DIAGNOSIS — M109 Gout, unspecified: Secondary | ICD-10-CM | POA: Insufficient documentation

## 2013-05-05 DIAGNOSIS — I10 Essential (primary) hypertension: Secondary | ICD-10-CM | POA: Insufficient documentation

## 2013-05-05 DIAGNOSIS — G8929 Other chronic pain: Secondary | ICD-10-CM | POA: Insufficient documentation

## 2013-05-05 DIAGNOSIS — S99929A Unspecified injury of unspecified foot, initial encounter: Principal | ICD-10-CM

## 2013-05-05 DIAGNOSIS — Z791 Long term (current) use of non-steroidal anti-inflammatories (NSAID): Secondary | ICD-10-CM | POA: Insufficient documentation

## 2013-05-05 DIAGNOSIS — S99919A Unspecified injury of unspecified ankle, initial encounter: Principal | ICD-10-CM

## 2013-05-05 MED ORDER — HYDROCODONE-ACETAMINOPHEN 5-325 MG PO TABS: 1.0000 | ORAL_TABLET | ORAL | Status: DC | PRN

## 2013-05-05 MED ORDER — HYDROCODONE-ACETAMINOPHEN 5-325 MG PO TABS
1.0000 | ORAL_TABLET | Freq: Once | ORAL | Status: AC
  Administered 2013-05-05: 1 via ORAL
  Filled 2013-05-05: qty 1

## 2013-05-05 NOTE — ED Provider Notes (Signed)
CSN: [id number]     Arrival date & time 05/05/13  1942 History   First MD Initiated Contact with Patient 05/05/13 2037     Chief Complaint  Patient presents with  . Knee Injury     (Consider location/radiation/quality/duration/timing/severity/associated sxs/prior Treatment) HPI Comments: Patient is 53 year old female with PMHx significant for right knee surgery, HTN, DM and gout who presents to the ED after stepping down from a truck bed and landing hard onto the bottom of her right foot - she reports increase in pain and swelling, she denies redness, numbness, tingling, ankle pain.  The history is provided by the patient. No language interpreter was used.    Past Medical History  Diagnosis Date  . High cholesterol   . Diabetes mellitus   . Hypertension   . Gout   . Migraine    Past Surgical History  Procedure Laterality Date  . Knee surgery    . Tonsillectomy    . Carpal tunnel release    . Abdominal hysterectomy    . Cholecystectomy    . Hand surgery     No family history on file. History  Substance Use Topics  . Smoking status: Never Smoker   . Smokeless tobacco: Not on file  . Alcohol Use: No   OB History   Grav Para Term Preterm Abortions TAB SAB Ect Mult Living                 Review of Systems  All other systems reviewed and are negative.      Allergies  Peanut oil  Home Medications   Current Outpatient Rx  Name  Route  Sig  Dispense  Refill  . acetaminophen (TYLENOL) 500 MG tablet   Oral   Take 2,000 mg by mouth 2 (two) times daily as needed. For pain         . allopurinol (ZYLOPRIM) 300 MG tablet   Oral   Take 300 mg by mouth daily.         . Atorvastatin Calcium (LIPITOR PO)   Oral   Take 1 tablet by mouth daily.           Marland Kitchen azithromycin (ZITHROMAX) 250 MG tablet   Oral   Take 1 tablet (250 mg total) by mouth daily. Take first 2 tablets together, then 1 every day until finished.   6 tablet   0   . colchicine 0.6 MG tablet  Oral   Take 0.6 mg by mouth daily.           . cyclobenzaprine (FLEXERIL) 10 MG tablet   Oral   Take 10 mg by mouth 3 (three) times daily as needed for muscle spasms.         Marland Kitchen glyBURIDE-metformin (GLUCOVANCE) 2.5-500 MG per tablet   Oral   Take 1 tablet by mouth daily with breakfast.         . HYDROcodone-acetaminophen (NORCO/VICODIN) 5-325 MG per tablet   Oral   Take 2 tablets by mouth every 4 (four) hours as needed.   15 tablet   0   . meloxicam (MOBIC) 7.5 MG tablet   Oral   Take 7.5 mg by mouth daily.         . metFORMIN (GLUCOPHAGE) 500 MG tablet   Oral   Take 500 mg by mouth daily with breakfast.           . omeprazole (PRILOSEC) 40 MG capsule   Oral   Take 40  mg by mouth daily.         . pravastatin (PRAVACHOL) 40 MG tablet   Oral   Take 40 mg by mouth daily.         Marland Kitchen. zolpidem (AMBIEN CR) 12.5 MG CR tablet   Oral   Take 12.5 mg by mouth at bedtime as needed for sleep.          BP 154/58  Pulse 115  Temp(Src) 96.6 F (35.9 C) (Oral)  Resp 20  Ht 5\' 6"  (1.676 m)  Wt 299 lb (135.626 kg)  BMI 48.28 kg/m2  SpO2 100% Physical Exam  Nursing note and vitals reviewed. Constitutional: She is oriented to person, place, and time. She appears well-developed and well-nourished. No distress.  HENT:  Head: Normocephalic and atraumatic.  Right Ear: External ear normal.  Left Ear: External ear normal.  Nose: Nose normal.  Mouth/Throat: Oropharynx is clear and moist. No oropharyngeal exudate.  Eyes: Conjunctivae are normal. Pupils are equal, round, and reactive to light. No scleral icterus.  Neck: Normal range of motion. Neck supple.  Cardiovascular: Normal rate, regular rhythm and normal heart sounds.  Exam reveals no gallop and no friction rub.   No murmur heard. Pulmonary/Chest: Effort normal and breath sounds normal. No respiratory distress. She has no wheezes. She has no rales. She exhibits no tenderness.  Abdominal: Soft. Bowel sounds are  normal. She exhibits no distension. There is no tenderness. There is no rebound and no guarding.  Musculoskeletal:       Right knee: She exhibits decreased range of motion, swelling and bony tenderness. She exhibits no effusion, no deformity, no erythema, normal alignment, no LCL laxity and no MCL laxity. Tenderness found. Lateral joint line tenderness noted.       Legs: Lymphadenopathy:    She has no cervical adenopathy.  Neurological: She is alert and oriented to person, place, and time. She exhibits normal muscle tone. Coordination normal.  Skin: Skin is warm and dry. No rash noted. No erythema. No pallor.  Psychiatric: She has a normal mood and affect. Her behavior is normal. Judgment and thought content normal.    ED Course  Procedures (including critical care time) Labs Review Labs Reviewed - No data to display Imaging Review Dg Knee Complete 4 Views Right  05/05/2013   CLINICAL DATA:  Right knee pain and swelling post fall  EXAM: RIGHT KNEE - COMPLETE 4+ VIEW  COMPARISON:  None  FINDINGS: Osseous demineralization.  Tricompartmental osteoarthritic changes with joint space narrowing and spur formation.  Mild lateral subluxation of tibia.  No acute fracture, dislocation or bone destruction.  Probable small knee joint effusion.  IMPRESSION: Advanced tricompartmental osteoarthritic changes of the right knee.  No acute abnormalities.   Electronically Signed   By: Ulyses SouthwardMark  Boles M.D.   On: 05/05/2013 20:24    EKG Interpretation   None       MDM   Right knee pain  Patient here with right knee pain with history of chronic knee pain to the area.  No clinical suspicion for septic arthritis, ligamentous injury.  Will place in ACE and pain control - she will follow up with her orthopedic surgeon.   Izola PriceFrances C. Marisue HumbleSanford, Cordelia Poche-C 05/05/13 2123

## 2013-05-05 NOTE — ED Provider Notes (Signed)
History/physical exam/procedure(s) were performed by non-physician practitioner and as supervising physician I was immediately available for consultation/collaboration. I have reviewed all notes and am in agreement with care and plan.   Hilario Quarryanielle S Shepard Keltz, MD 05/05/13 2127

## 2013-05-05 NOTE — Discharge Instructions (Signed)
Arthralgia °Your caregiver has diagnosed you as suffering from an arthralgia. Arthralgia means there is pain in a joint. This can come from many reasons including: °· Bruising the joint which causes soreness (inflammation) in the joint. °· Wear and tear on the joints which occur as we grow older (osteoarthritis). °· Overusing the joint. °· Various forms of arthritis. °· Infections of the joint. °Regardless of the cause of pain in your joint, most of these different pains respond to anti-inflammatory drugs and rest. The exception to this is when a joint is infected, and these cases are treated with antibiotics, if it is a bacterial infection. °HOME CARE INSTRUCTIONS  °· Rest the injured area for as long as directed by your caregiver. Then slowly start using the joint as directed by your caregiver and as the pain allows. Crutches as directed may be useful if the ankles, knees or hips are involved. If the knee was splinted or casted, continue use and care as directed. If an stretchy or elastic wrapping bandage has been applied today, it should be removed and re-applied every 3 to 4 hours. It should not be applied tightly, but firmly enough to keep swelling down. Watch toes and feet for swelling, bluish discoloration, coldness, numbness or excessive pain. If any of these problems (symptoms) occur, remove the ace bandage and re-apply more loosely. If these symptoms persist, contact your caregiver or return to this location. °· For the first 24 hours, keep the injured extremity elevated on pillows while lying down. °· Apply ice for 15-20 minutes to the sore joint every couple hours while awake for the first half day. Then 03-04 times per day for the first 48 hours. Put the ice in a plastic bag and place a towel between the bag of ice and your skin. °· Wear any splinting, casting, elastic bandage applications, or slings as instructed. °· Only take over-the-counter or prescription medicines for pain, discomfort, or fever as  directed by your caregiver. Do not use aspirin immediately after the injury unless instructed by your physician. Aspirin can cause increased bleeding and bruising of the tissues. °· If you were given crutches, continue to use them as instructed and do not resume weight bearing on the sore joint until instructed. °Persistent pain and inability to use the sore joint as directed for more than 2 to 3 days are warning signs indicating that you should see a caregiver for a follow-up visit as soon as possible. Initially, a hairline fracture (break in bone) may not be evident on X-rays. Persistent pain and swelling indicate that further evaluation, non-weight bearing or use of the joint (use of crutches or slings as instructed), or further X-rays are indicated. X-rays may sometimes not show a small fracture until a week or 10 days later. Make a follow-up appointment with your own caregiver or one to whom we have referred you. A radiologist (specialist in reading X-rays) may read your X-rays. Make sure you know how you are to obtain your X-ray results. Do not assume everything is normal if you do not hear from us. °SEEK MEDICAL CARE IF: °Bruising, swelling, or pain increases. °SEEK IMMEDIATE MEDICAL CARE IF:  °· Your fingers or toes are numb or blue. °· The pain is not responding to medications and continues to stay the same or get worse. °· The pain in your joint becomes severe. °· You develop a fever over 102° F (38.9° C). °· It becomes impossible to move or use the joint. °MAKE SURE YOU:  °·   Understand these instructions. °· Will watch your condition. °· Will get help right away if you are not doing well or get worse. °Document Released: 02/28/2005 Document Revised: 05/23/2011 Document Reviewed: 10/17/2007 °ExitCare® Patient Information ©2014 [employer]. ° °Knee Pain °Knee pain can be a result of an injury or other medical conditions. Treatment will depend on the cause of your pain. °HOME CARE °· Only take medicine as  told by your doctor. °· Keep a healthy weight. Being overweight can make the knee hurt more. °· Stretch before exercising or playing sports. °· If there is constant knee pain, change the way you exercise. Ask your doctor for advice. °· Make sure shoes fit well. Choose the right shoe for the sport or activity. °· Protect your knees. Wear kneepads if needed. °· Rest when you are tired. °GET HELP RIGHT AWAY IF:  °· Your knee pain does not stop. °· Your knee pain does not get better. °· Your knee joint feels hot to the touch. °· You have a fever. °MAKE SURE YOU:  °· Understand these instructions. °· Will watch this condition. °· Will get help right away if you are not doing well or get worse. °Document Released: 05/27/2008 Document Revised: 05/23/2011 Document Reviewed: 05/27/2008 °Exit[employer]® Patient Information ©2014 [employer]. ° °

## 2013-05-05 NOTE — ED Notes (Signed)
Patient was getting out of a truck and fell, landing on her right knee. Now c/o pain and swelling

## 2014-09-21 ENCOUNTER — Emergency Department (HOSPITAL_BASED_OUTPATIENT_CLINIC_OR_DEPARTMENT_OTHER)
Admission: EM | Admit: 2014-09-21 | Discharge: 2014-09-21 | Disposition: A | Discharge status: Home / Self Care | Payer: Medicaid Other | Attending: Emergency Medicine | Admitting: Emergency Medicine

## 2014-09-21 ENCOUNTER — Emergency Department (HOSPITAL_BASED_OUTPATIENT_CLINIC_OR_DEPARTMENT_OTHER): Payer: Medicaid Other

## 2014-09-21 ENCOUNTER — Encounter (HOSPITAL_BASED_OUTPATIENT_CLINIC_OR_DEPARTMENT_OTHER): Payer: Self-pay | Admitting: Emergency Medicine

## 2014-09-21 DIAGNOSIS — I10 Essential (primary) hypertension: Secondary | ICD-10-CM | POA: Diagnosis not present

## 2014-09-21 DIAGNOSIS — E119 Type 2 diabetes mellitus without complications: Secondary | ICD-10-CM | POA: Diagnosis not present

## 2014-09-21 DIAGNOSIS — Z79899 Other long term (current) drug therapy: Secondary | ICD-10-CM | POA: Diagnosis not present

## 2014-09-21 DIAGNOSIS — R05 Cough: Secondary | ICD-10-CM

## 2014-09-21 DIAGNOSIS — M109 Gout, unspecified: Secondary | ICD-10-CM | POA: Insufficient documentation

## 2014-09-21 DIAGNOSIS — R059 Cough, unspecified: Secondary | ICD-10-CM

## 2014-09-21 DIAGNOSIS — E780 Pure hypercholesterolemia: Secondary | ICD-10-CM | POA: Diagnosis not present

## 2014-09-21 DIAGNOSIS — R000 Tachycardia, unspecified: Secondary | ICD-10-CM | POA: Insufficient documentation

## 2014-09-21 LAB — BASIC METABOLIC PANEL
ANION GAP: 10 (ref 5–15)
BUN: 7 mg/dL (ref 6–20)
CHLORIDE: 102 mmol/L (ref 101–111)
CO2: 26 mmol/L (ref 22–32)
Calcium: 9.2 mg/dL (ref 8.9–10.3)
Creatinine, Ser: 0.85 mg/dL (ref 0.44–1.00)
GFR calc Af Amer: >60 mL/min (ref >60)
GFR calc non Af Amer: >60 mL/min (ref >60)
Glucose, Bld: 188 mg/dL — ABNORMAL HIGH (ref 65–99)
POTASSIUM: 4.1 mmol/L (ref 3.5–5.1)
Sodium: 138 mmol/L (ref 135–145)

## 2014-09-21 LAB — CBC WITH DIFFERENTIAL/PLATELET
BASOS ABS: 0 10*3/uL (ref 0.0–0.1)
BASOS PCT: 0 % (ref 0–1)
EOS ABS: 0.2 10*3/uL (ref 0.0–0.7)
Eosinophils Relative: 3 % (ref 0–5)
HEMATOCRIT: 39.2 % (ref 36.0–46.0)
HEMOGLOBIN: 12.8 g/dL (ref 12.0–15.0)
LYMPHS PCT: 28 % (ref 12–46)
Lymphs Abs: 2 10*3/uL (ref 0.7–4.0)
MCH: 28.1 pg (ref 26.0–34.0)
MCHC: 32.7 g/dL (ref 30.0–36.0)
MCV: 86.2 fL (ref 78.0–100.0)
MONO ABS: 0.6 10*3/uL (ref 0.1–1.0)
Monocytes Relative: 8 % (ref 3–12)
NEUTROS ABS: 4.4 10*3/uL (ref 1.7–7.7)
NEUTROS PCT: 61 % (ref 43–77)
Platelets: 401 10*3/uL — ABNORMAL HIGH (ref 150–400)
RBC: 4.55 MIL/uL (ref 3.87–5.11)
RDW: 13.9 % (ref 11.5–15.5)
WBC: 7.2 10*3/uL (ref 4.0–10.5)

## 2014-09-21 LAB — D-DIMER, QUANTITATIVE (NOT AT ARMC): D DIMER QUANT: 0.45 ug{FEU}/mL (ref 0.00–0.48)

## 2014-09-21 LAB — PREGNANCY, URINE: Preg Test, Ur: NEGATIVE

## 2014-09-21 LAB — TROPONIN I: Troponin I: <0.03 ng/mL (ref <0.031)

## 2014-09-21 LAB — BRAIN NATRIURETIC PEPTIDE: B Natriuretic Peptide: 13.6 pg/mL (ref 0.0–100.0)

## 2014-09-21 MED ORDER — MORPHINE SULFATE 4 MG/ML IJ SOLN
4.0000 mg | Freq: Once | INTRAMUSCULAR | Status: AC
  Administered 2014-09-21: 4 mg via INTRAVENOUS
  Filled 2014-09-21: qty 1

## 2014-09-21 MED ORDER — ONDANSETRON 4 MG PO TBDP
4.0000 mg | ORAL_TABLET | Freq: Once | ORAL | Status: AC
  Administered 2014-09-21: 4 mg via ORAL
  Filled 2014-09-21: qty 1

## 2014-09-21 MED ORDER — HYDROCODONE-ACETAMINOPHEN 5-325 MG PO TABS: ORAL_TABLET | ORAL | Status: DC

## 2014-09-21 MED ORDER — SODIUM CHLORIDE 0.9 % IV BOLUS (SEPSIS)
1000.0000 mL | Freq: Once | INTRAVENOUS | Status: AC
  Administered 2014-09-21: 1000 mL via INTRAVENOUS

## 2014-09-21 NOTE — Discharge Instructions (Signed)
Please follow with your primary care doctor in the next 2 days for a check-up. They must obtain records for further management.  ° °Do not hesitate to return to the Emergency Department for any new, worsening or concerning symptoms.  ° ° °Cough, Adult ° A cough is a reflex. It helps you clear your throat and airways. A cough can help heal your body. A cough can last 2 or 3 weeks (acute) or may last more than 8 weeks (chronic). Some common causes of a cough can include an infection, allergy, or a cold. °HOME CARE °· Only take medicine as told by your doctor. °· If given, take your medicines (antibiotics) as told. Finish them even if you start to feel better. °· Use a cold steam vaporizer or humidifier in your home. This can help loosen thick spit (secretions). °· Sleep so you are almost sitting up (semi-upright). Use pillows to do this. This helps reduce coughing. °· Rest as needed. °· Stop smoking if you smoke. °GET HELP RIGHT AWAY IF: °· You have yellowish-white fluid (pus) in your thick spit. °· Your cough gets worse. °· Your medicine does not reduce coughing, and you are losing sleep. °· You cough up blood. °· You have trouble breathing. °· Your pain gets worse and medicine does not help. °· You have a fever. °MAKE SURE YOU:  °· Understand these instructions. °· Will watch your condition. °· Will get help right away if you are not doing well or get worse. °Document Released: 11/11/2010 Document Revised: 07/15/2013 Document Reviewed: 11/11/2010 °[employer]® Patient Information ©2015 [employer]. This information is not intended to replace advice given to you by your health care provider. Make sure you discuss any questions you have with your health care provider. ° °

## 2014-09-21 NOTE — ED Provider Notes (Signed)
CSN: [id number]     Arrival date & time 09/21/14  1502 History   First MD Initiated Contact with Patient 09/21/14 1640     Chief Complaint  Patient presents with  . Cough     (Consider location/radiation/quality/duration/timing/severity/associated sxs/prior Treatment) HPI  Blood pressure 158/89, pulse 116, temperature 98.3 F (36.8 C), temperature source Oral, resp. rate 20, SpO2 99 %.  Tracey Castillo is a 54 y.o. female complaining of dry cough intermittently over the course of 2 weeks with associated pleuritic chest pain. Patient rates the pain at 9 out of 10, it is retrosternal. She denies shortness of breath except when she has a coughing spell, fever, chills, nausea, vomiting, history of DVT or PE, recent immobilization, calf pain or swelling, orthopnea, PND. On review of systems she notes a increasing peripheral edema in the bilateral feet 9's taking any ACE inhibitors/ARBS  Past Medical History  Diagnosis Date  . High cholesterol   . Diabetes mellitus   . Hypertension   . Gout   . Migraine    Past Surgical History  Procedure Laterality Date  . Knee surgery    . Tonsillectomy    . Carpal tunnel release    . Abdominal hysterectomy    . Cholecystectomy    . Hand surgery     History reviewed. No pertinent family history. History  Substance Use Topics  . Smoking status: Never Smoker   . Smokeless tobacco: Not on file  . Alcohol Use: No   OB History    No data available     Review of Systems  10 systems reviewed and found to be negative, except as noted in the HPI.   Allergies  Peanut oil  Home Medications   Prior to Admission medications   Medication Sig Start Date End Date Taking? Authorizing Provider  acetaminophen (TYLENOL) 500 MG tablet Take 2,000 mg by mouth 2 (two) times daily as needed. For pain    Historical Provider, MD  allopurinol (ZYLOPRIM) 300 MG tablet Take 300 mg by mouth daily.    Historical Provider, MD  Atorvastatin Calcium (LIPITOR  PO) Take 1 tablet by mouth daily.      Historical Provider, MD  azithromycin (ZITHROMAX) 250 MG tablet Take 1 tablet (250 mg total) by mouth daily. Take first 2 tablets together, then 1 every day until finished. 02/22/13   Heather Laisure, PA-C  colchicine 0.6 MG tablet Take 0.6 mg by mouth daily.      Historical Provider, MD  glyBURIDE-metformin (GLUCOVANCE) 2.5-500 MG per tablet Take 1 tablet by mouth daily with breakfast.    Historical Provider, MD  HYDROcodone-acetaminophen (NORCO/VICODIN) 5-325 MG per tablet Take 1-2 tablets by mouth every 6 hours as needed for pain and/or cough. 09/21/14   Kashay Cavenaugh, PA-C  metFORMIN (GLUCOPHAGE) 500 MG tablet Take 500 mg by mouth daily with breakfast.      Historical Provider, MD  omeprazole (PRILOSEC) 40 MG capsule Take 40 mg by mouth daily.    Historical Provider, MD  pravastatin (PRAVACHOL) 40 MG tablet Take 40 mg by mouth daily.    Historical Provider, MD  zolpidem (AMBIEN CR) 12.5 MG CR tablet Take 12.5 mg by mouth at bedtime as needed for sleep.    Historical Provider, MD   BP 139/70 mmHg  Pulse 88  Temp(Src) 98.3 F (36.8 C) (Oral)  Resp 15  SpO2 100% Physical Exam  Constitutional: She is oriented to person, place, and time. She appears well-developed and well-nourished. No distress.  HENT:  Head: Normocephalic.  Mouth/Throat: Oropharynx is clear and moist.  Eyes: Conjunctivae and EOM are normal. Pupils are equal, round, and reactive to light.  Neck: Normal range of motion. No JVD present. No tracheal deviation present.  Cardiovascular: Regular rhythm and intact distal pulses.   Mild tachycardia, Radial pulse equal bilaterally  Pulmonary/Chest: Effort normal and breath sounds normal. No stridor. No respiratory distress. She has no wheezes. She has no rales. She exhibits no tenderness.  Abdominal: Soft. She exhibits no distension and no mass. There is no tenderness. There is no rebound and no guarding.  Musculoskeletal: Normal range of  motion. She exhibits no edema or tenderness.  No calf asymmetry, superficial collaterals, palpable cords, edema, Homans sign negative bilaterally.    Neurological: She is alert and oriented to person, place, and time.  Skin: Skin is warm. She is not diaphoretic.  Psychiatric: She has a normal mood and affect.  Nursing note and vitals reviewed.   ED Course  Procedures (including critical care time) Labs Review Labs Reviewed  CBC WITH DIFFERENTIAL/PLATELET - Abnormal; Notable for the following:    Platelets 401 (*)    All other components within normal limits  BASIC METABOLIC PANEL - Abnormal; Notable for the following:    Glucose, Bld 188 (*)    All other components within normal limits  PREGNANCY, URINE  TROPONIN I  BRAIN NATRIURETIC PEPTIDE  D-DIMER, QUANTITATIVE (NOT AT [facility])    Imaging Review Dg Chest 2 View  09/21/2014   CLINICAL DATA:  Cough and congestion for 2 weeks.  EXAM: CHEST  2 VIEW  COMPARISON:  08/15/2012.  FINDINGS: The cardiac silhouette, mediastinal and hilar contours are within normal limits. The lungs are clear. No pleural effusion. The bony thorax is intact.  IMPRESSION: No acute cardiopulmonary findings.   Electronically Signed   By: Rudie Meyer M.D.   On: 09/21/2014 16:14     EKG Interpretation None     <ECG>  ED ECG REPORT   Date: 09/21/2014  Rate: 99  Rhythm: normal sinus rhythm  QRS Axis: normal  Intervals: normal  ST/T Wave abnormalities: nonspecific T wave changes  Conduction Disutrbances:none  Narrative Interpretation:   Old EKG Reviewed: none available  I have personally reviewed the EKG tracing and agree with the computerized printout as noted.   MDM   Final diagnoses:  Cough    Filed Vitals:   09/21/14 1800 09/21/14 1812 09/21/14 1830 09/21/14 1900  BP: 161/76 161/76 164/72 139/70  Pulse: 102 97 88 88  Temp:      TempSrc:      Resp: SpO2: 98% 100% 99% 100%    Medications  sodium chloride 0.9 % bolus  1,000 mL (0 mLs Intravenous Stopped 09/21/14 1901)  morphine 4 MG/ML injection 4 mg (4 mg Intravenous Given 09/21/14 1756)  ondansetron (ZOFRAN-ODT) disintegrating tablet 4 mg (4 mg Oral Given 09/21/14 1913)    Tracey Castillo is a pleasant 54 y.o. female presenting with dry cough and pleuritic chest pain worsening over the course of 2 weeks. Patient is initially tachycardic to 116, will check basic blood work, EKG and d-dimer. X-rays without infiltrate. Patient will be bolused.   The cardia has resolved with fluid. EKG nonischemic, nonspecific T-wave changes, no prior for comparison. Blood work with no abnormalities including normal d-dimer and troponin. Also her proBNP is not elevated.  Likely dry cough secondary to URI versus allergies. Advised patient follow closely with her primary care physician. Will write  Rxcodin for cough suppression.  Evaluation does not show pathology that would require ongoing emergent intervention or inpatient treatment. Pt is hemodynamically stable and mentating appropriately. Discussed findings and plan with patient/guardian, who agrees with care plan. All questions answered. Return precautions discussed and outpatient follow up given.   New Prescriptions   HYDROCODONE-ACETAMINOPHEN (NORCO/VICODIN) 5-325 MG PER TABLET    Take 1-2 tablets by mouth every 6 hours as needed for pain and/or cough.         Wynetta Emery, PA-C 09/21/14 1915  Margarita Grizzle, MD 09/22/14 1515

## 2014-09-21 NOTE — ED Notes (Signed)
Pt with dry, nonproductive cough and congestion x 2 weeks. Active cough in triage.

## 2015-01-30 ENCOUNTER — Encounter (HOSPITAL_BASED_OUTPATIENT_CLINIC_OR_DEPARTMENT_OTHER): Payer: Self-pay | Admitting: Emergency Medicine

## 2015-01-30 DIAGNOSIS — I10 Essential (primary) hypertension: Secondary | ICD-10-CM | POA: Insufficient documentation

## 2015-01-30 DIAGNOSIS — G43909 Migraine, unspecified, not intractable, without status migrainosus: Secondary | ICD-10-CM | POA: Diagnosis not present

## 2015-01-30 DIAGNOSIS — E119 Type 2 diabetes mellitus without complications: Secondary | ICD-10-CM | POA: Insufficient documentation

## 2015-01-30 DIAGNOSIS — R110 Nausea: Secondary | ICD-10-CM | POA: Insufficient documentation

## 2015-01-30 NOTE — ED Notes (Signed)
Pt states history of same. Symptoms started today around 5p. Nausea but no vomiting. Last migraine was 7months ago per patient.

## 2015-01-31 ENCOUNTER — Emergency Department (HOSPITAL_BASED_OUTPATIENT_CLINIC_OR_DEPARTMENT_OTHER)
Admission: EM | Admit: 2015-01-31 | Discharge: 2015-01-31 | Discharge status: Against Medical Advice | Payer: Medicaid Other | Attending: Emergency Medicine | Admitting: Emergency Medicine

## 2015-01-31 NOTE — ED Notes (Signed)
Patient has not returned to room. Security asked to look for patient in parking lot since MD wants to see the patient. The patient is unable to be found at this time. The patient is taken out of system

## 2015-01-31 NOTE — ED Notes (Signed)
Patient walked out to car.

## 2015-07-24 ENCOUNTER — Encounter (HOSPITAL_BASED_OUTPATIENT_CLINIC_OR_DEPARTMENT_OTHER): Payer: Self-pay | Admitting: *Deleted

## 2015-07-24 ENCOUNTER — Emergency Department (HOSPITAL_BASED_OUTPATIENT_CLINIC_OR_DEPARTMENT_OTHER)
Admission: EM | Admit: 2015-07-24 | Discharge: 2015-07-24 | Disposition: A | Discharge status: Home / Self Care | Payer: Medicaid Other | Attending: Emergency Medicine | Admitting: Emergency Medicine

## 2015-07-24 DIAGNOSIS — M79601 Pain in right arm: Secondary | ICD-10-CM | POA: Diagnosis present

## 2015-07-24 DIAGNOSIS — Z79899 Other long term (current) drug therapy: Secondary | ICD-10-CM | POA: Diagnosis not present

## 2015-07-24 DIAGNOSIS — E119 Type 2 diabetes mellitus without complications: Secondary | ICD-10-CM | POA: Insufficient documentation

## 2015-07-24 DIAGNOSIS — Z7984 Long term (current) use of oral hypoglycemic drugs: Secondary | ICD-10-CM | POA: Insufficient documentation

## 2015-07-24 DIAGNOSIS — I10 Essential (primary) hypertension: Secondary | ICD-10-CM | POA: Insufficient documentation

## 2015-07-24 DIAGNOSIS — M7541 Impingement syndrome of right shoulder: Secondary | ICD-10-CM | POA: Insufficient documentation

## 2015-07-24 MED ORDER — TRAMADOL HCL 50 MG PO TABS: 50.0000 mg | ORAL_TABLET | Freq: Four times a day (QID) | ORAL | Status: DC | PRN

## 2015-07-24 MED ORDER — ACETAMINOPHEN 500 MG PO TABS: 1000.0000 mg | ORAL_TABLET | Freq: Four times a day (QID) | ORAL | Status: DC | PRN

## 2015-07-24 NOTE — ED Notes (Signed)
Right arm pain x 2 weeks intermittently.  Denies known injury.  Tender on palpation.

## 2015-07-24 NOTE — ED Provider Notes (Signed)
CSN: [id number]     Arrival date & time 07/24/15  1452 History   First MD Initiated Contact with Patient 07/24/15 1538     Chief Complaint  Patient presents with  . Arm Pain     (Consider location/radiation/quality/duration/timing/severity/associated sxs/prior Treatment) HPI Patient has had worsening right shoulder pain for several weeks. No injury. It aches and burns along the top of her shoulder and she indicates the top of the deltoid. Sometimes the pain is much worse in the morning when she awakens and she has numbness and pins and needles in the arm and the hand. That often resolves and then she just has pain in the top of the shoulder. It is much worse with certain position movements. She denies history of similar. No weakness of the hand or the arm. Past Medical History  Diagnosis Date  . High cholesterol   . Diabetes mellitus   . Hypertension   . Gout   . Migraine    Past Surgical History  Procedure Laterality Date  . Knee surgery    . Tonsillectomy    . Carpal tunnel release    . Abdominal hysterectomy    . Cholecystectomy    . Hand surgery     History reviewed. No pertinent family history. Social History  Substance Use Topics  . Smoking status: Never Smoker   . Smokeless tobacco: None  . Alcohol Use: No   OB History    No data available     Review of Systems Constitutional: No fever or chills Cardiovascular: No chest pain or palpitation Respiratory: No shortness of breath or cough.   Allergies  Peanut oil  Home Medications   Prior to Admission medications   Medication Sig Start Date End Date Taking? Authorizing Provider  acetaminophen (TYLENOL) 500 MG tablet Take 2,000 mg by mouth 2 (two) times daily as needed. For pain    Historical Provider, MD  acetaminophen (TYLENOL) 500 MG tablet Take 2 tablets (1,000 mg total) by mouth every 6 (six) hours as needed. 07/24/15   Arby BarretteMarcy Shayra Anton, MD  allopurinol (ZYLOPRIM) 300 MG tablet Take 300 mg by mouth daily.     Historical Provider, MD  Atorvastatin Calcium (LIPITOR PO) Take 1 tablet by mouth daily.      Historical Provider, MD  colchicine 0.6 MG tablet Take 0.6 mg by mouth daily.      Historical Provider, MD  glyBURIDE-metformin (GLUCOVANCE) 2.5-500 MG per tablet Take 1 tablet by mouth daily with breakfast.    Historical Provider, MD  metFORMIN (GLUCOPHAGE) 500 MG tablet Take 500 mg by mouth daily with breakfast.      Historical Provider, MD  omeprazole (PRILOSEC) 40 MG capsule Take 40 mg by mouth daily.    Historical Provider, MD  pravastatin (PRAVACHOL) 40 MG tablet Take 40 mg by mouth daily.    Historical Provider, MD  traMADol (ULTRAM) 50 MG tablet Take 1 tablet (50 mg total) by mouth every 6 (six) hours as needed for moderate pain. You may take 1-2 tablets every 6 hours as needed for pain. You may also take with Tylenol for additional pain control 07/24/15   Arby BarretteMarcy Gary Bultman, MD  zolpidem (AMBIEN CR) 12.5 MG CR tablet Take 12.5 mg by mouth at bedtime as needed for sleep.    Historical Provider, MD   BP 150/89 mmHg  Pulse 110  Temp(Src) 97.8 F (36.6 C) (Oral)  Resp 18  Ht 5\' 6"  (1.676 m)  Wt 280 lb (127.007 kg)  BMI 45.21 kg/m2  SpO2 100% Physical Exam  Constitutional:  Patient is moderately obese. She is alert and nontoxic. No respiratory distress.  HENT:  Head: Normocephalic and atraumatic.  Eyes: EOM are normal.  Neck: Neck supple.  Cardiovascular: Normal rate, regular rhythm, normal heart sounds and intact distal pulses.   Pulmonary/Chest: Effort normal and breath sounds normal.  Musculoskeletal: She exhibits tenderness. She exhibits no edema.  Patient has tenderness to palpation over the point of the shoulder and over the central portion of the deltoid. No soft tissue abnormality. Pain is much exacerbated by abduction. Soft tissue palpation of the arm is normal without tenderness. Pulses 2+. Hand grip strength is 5 out of 5. No sensory deficit.  Neurological: She is alert. She exhibits  normal muscle tone. Coordination normal.  Skin: Skin is warm and dry.  Psychiatric: She has a normal mood and affect.    ED Course  Procedures (including critical care time) Labs Review Labs Reviewed - No data to display  Imaging Review No results found. I have personally reviewed and evaluated these images and lab results as part of my medical decision-making.   EKG Interpretation None      MDM   Final diagnoses:  Impingement syndrome of right shoulder   The patient had 2 weeks of ongoing shoulder pain worse with position change and movement. It exacerbates with abduction. Patient periodically is getting paresthesia. 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 This is intermittent and she does not have motor deficit. She is counseled on the necessary follow-up. Tramadol and acetaminophen prescribed for pain control.    Arby Barrette, MD 07/24/15 [phone]

## 2015-07-24 NOTE — Discharge Instructions (Signed)
Impingement Syndrome, Rotator Cuff, Bursitis With Rehab °Impingement syndrome is a condition that involves inflammation of the tendons of the rotator cuff and the subacromial bursa, that causes pain in the shoulder. The rotator cuff consists of four tendons and muscles that control much of the shoulder and upper arm function. The subacromial bursa is a fluid filled sac that helps reduce friction between the rotator cuff and one of the bones of the shoulder (acromion). Impingement syndrome is usually an overuse injury that causes swelling of the bursa (bursitis), swelling of the tendon (tendonitis), and/or a tear of the tendon (strain). Strains are classified into three categories. Grade 1 strains cause pain, but the tendon is not lengthened. Grade 2 strains include a lengthened ligament, due to the ligament being stretched or partially ruptured. With grade 2 strains there is still function, although the function may be decreased. Grade 3 strains include a complete tear of the tendon or muscle, and function is usually impaired. °SYMPTOMS  °· Pain around the shoulder, often at the outer portion of the upper arm. °· Pain that gets worse with shoulder function, especially when reaching overhead or lifting. °· Sometimes, aching when not using the arm. °· Pain that wakes you up at night. °· Sometimes, tenderness, swelling, warmth, or redness over the affected area. °· Loss of strength. °· Limited motion of the shoulder, especially reaching behind the back (to the back pocket or to unhook bra) or across your body. °· Crackling sound (crepitation) when moving the arm. °· Biceps tendon pain and inflammation (in the front of the shoulder). Worse when bending the elbow or lifting. °CAUSES  °Impingement syndrome is often an overuse injury, in which chronic (repetitive) motions cause the tendons or bursa to become inflamed. A strain occurs when a force is paced on the tendon or muscle that is greater than it can withstand.  Common mechanisms of injury include: °Stress from sudden increase in duration, frequency, or intensity of training. °· Direct hit (trauma) to the shoulder. °· Aging, erosion of the tendon with normal use. °· Bony bump on shoulder (acromial spur). °RISK INCREASES WITH: °· Contact sports (football, wrestling, boxing). °· Throwing sports (baseball, tennis, volleyball). °· Weightlifting and bodybuilding. °· Heavy labor. °· Previous injury to the rotator cuff, including impingement. °· Poor shoulder strength and flexibility. °· Failure to warm up properly before activity. °· Inadequate protective equipment. °· Old age. °· Bony bump on shoulder (acromial spur). °PREVENTION  °· Warm up and stretch properly before activity. °· Allow for adequate recovery between workouts. °· Maintain physical fitness: °¨ Strength, flexibility, and endurance. °¨ Cardiovascular fitness. °· Learn and use proper exercise technique. °PROGNOSIS  °If treated properly, impingement syndrome usually goes away within 6 weeks. Sometimes surgery is required.  °RELATED COMPLICATIONS  °· Longer healing time if not properly treated, or if not given enough time to heal. °· Recurring symptoms, that result in a chronic condition. °· Shoulder stiffness, frozen shoulder, or loss of motion. °· Rotator cuff tendon tear. °· Recurring symptoms, especially if activity is resumed too soon, with overuse, with a direct blow, or when using poor technique. °TREATMENT  °Treatment first involves the use of ice and medicine, to reduce pain and inflammation. The use of strengthening and stretching exercises may help reduce pain with activity. These exercises may be performed at home or with a therapist. If non-surgical treatment is unsuccessful after more than 6 months, surgery may be advised. After surgery and rehabilitation, activity is usually possible in 3 months.  °  MEDICATION °· If pain medicine is needed, nonsteroidal anti-inflammatory medicines (aspirin and  ibuprofen), or other minor pain relievers (acetaminophen), are often advised. °· Do not take pain medicine for 7 days before surgery. °· Prescription pain relievers may be given, if your caregiver thinks they are needed. Use only as directed and only as much as you need. °· Corticosteroid injections may be given by your caregiver. These injections should be reserved for the most serious cases, because they may only be given a certain number of times. °HEAT AND COLD °· Cold treatment (icing) should be applied for 10 to 15 minutes every 2 to 3 hours for inflammation and pain, and immediately after activity that aggravates your symptoms. Use ice packs or an ice massage. °· Heat treatment may be used before performing stretching and strengthening activities prescribed by your caregiver, physical therapist, or athletic trainer. Use a heat pack or a warm water soak. °SEEK MEDICAL CARE IF:  °· Symptoms get worse or do not improve in 4 to 6 weeks, despite treatment. °· New, unexplained symptoms develop. (Drugs used in treatment may produce side effects.) ° °

## 2016-06-09 ENCOUNTER — Encounter (HOSPITAL_BASED_OUTPATIENT_CLINIC_OR_DEPARTMENT_OTHER): Payer: Self-pay

## 2016-06-09 ENCOUNTER — Emergency Department (HOSPITAL_BASED_OUTPATIENT_CLINIC_OR_DEPARTMENT_OTHER)
Admission: EM | Admit: 2016-06-09 | Discharge: 2016-06-09 | Disposition: A | Discharge status: Home / Self Care | Payer: Medicaid Other | Attending: Emergency Medicine | Admitting: Emergency Medicine

## 2016-06-09 DIAGNOSIS — K0889 Other specified disorders of teeth and supporting structures: Secondary | ICD-10-CM | POA: Diagnosis not present

## 2016-06-09 DIAGNOSIS — Z79899 Other long term (current) drug therapy: Secondary | ICD-10-CM | POA: Insufficient documentation

## 2016-06-09 DIAGNOSIS — E119 Type 2 diabetes mellitus without complications: Secondary | ICD-10-CM | POA: Diagnosis not present

## 2016-06-09 DIAGNOSIS — I10 Essential (primary) hypertension: Secondary | ICD-10-CM | POA: Insufficient documentation

## 2016-06-09 DIAGNOSIS — Z7984 Long term (current) use of oral hypoglycemic drugs: Secondary | ICD-10-CM | POA: Diagnosis not present

## 2016-06-09 MED ORDER — PENICILLIN V POTASSIUM 500 MG PO TABS: 500.0000 mg | ORAL_TABLET | Freq: Four times a day (QID) | ORAL | 0 refills | Status: AC

## 2016-06-09 MED ORDER — OXYCODONE-ACETAMINOPHEN 5-325 MG PO TABS
1.0000 | ORAL_TABLET | Freq: Once | ORAL | Status: AC
  Administered 2016-06-09: 1 via ORAL
  Filled 2016-06-09: qty 1

## 2016-06-09 NOTE — ED Provider Notes (Signed)
[facility] Provider Note   CSN: [id number] Arrival date & time: 06/09/16  1553   By signing my name below, I, Modena Jansky, attest that this documentation has been prepared under the direction and in the presence of non-physician practitioner, Chase Picket Zhaniya Swallows, PA-C. Electronically Signed: Modena Jansky, Scribe. 06/09/2016. 4:10 PM.  History   Chief Complaint Chief Complaint  Patient presents with  . Dental Pain   The history is provided by the patient. No language interpreter was used.    HPI Comments: Tracey Castillo is a 56 y.o. female who presents to the Emergency Department complaining of constant moderate right lower dental pain that started about a week ago. She has an appointment on 06/25/16 with the dentist, but came to the ED due to severity of pain. She has been taking Tylenol and ibuprofen with minimal relief. Her pain is exacerbated by eating. She reports right-sided facial swelling which has since resolved. Feels as if she has been runnign a temperature. Pt's temperature in the ED today was 98.6. She denies any prior hx of similar complaint, SOB, difficulty swallowing, or other complaints.   Past Medical History:  Diagnosis Date  . Diabetes mellitus   . Gout   . High cholesterol   . Hypertension   . Migraine     There are no active problems to display for this patient.   Past Surgical History:  Procedure Laterality Date  . ABDOMINAL HYSTERECTOMY    . CARPAL TUNNEL RELEASE    . CHOLECYSTECTOMY    . HAND SURGERY    . KNEE SURGERY    . TONSILLECTOMY      OB History    No data available       Home Medications    Prior to Admission medications   Medication Sig Start Date End Date Taking? Authorizing Provider  acetaminophen (TYLENOL) 500 MG tablet Take 2,000 mg by mouth 2 (two) times daily as needed. For pain    Historical Provider, MD  acetaminophen (TYLENOL) 500 MG tablet Take 2 tablets (1,000 mg total) by mouth every 6 (six) hours as  needed. 07/24/15   Arby Barrette, MD  allopurinol (ZYLOPRIM) 300 MG tablet Take 300 mg by mouth daily.    Historical Provider, MD  Atorvastatin Calcium (LIPITOR PO) Take 1 tablet by mouth daily.      Historical Provider, MD  colchicine 0.6 MG tablet Take 0.6 mg by mouth daily.      Historical Provider, MD  glyBURIDE-metformin (GLUCOVANCE) 2.5-500 MG per tablet Take 1 tablet by mouth daily with breakfast.    Historical Provider, MD  metFORMIN (GLUCOPHAGE) 500 MG tablet Take 500 mg by mouth daily with breakfast.      Historical Provider, MD  omeprazole (PRILOSEC) 40 MG capsule Take 40 mg by mouth daily.    Historical Provider, MD  penicillin v potassium (VEETID) 500 MG tablet Take 1 tablet (500 mg total) by mouth 4 (four) times daily. 06/09/16 06/16/16  Chase Picket Zabdi Mis, PA-C  pravastatin (PRAVACHOL) 40 MG tablet Take 40 mg by mouth daily.    Historical Provider, MD  traMADol (ULTRAM) 50 MG tablet Take 1 tablet (50 mg total) by mouth every 6 (six) hours as needed for moderate pain. You may take 1-2 tablets every 6 hours as needed for pain. You may also take with Tylenol for additional pain control 07/24/15   Arby Barrette, MD  zolpidem (AMBIEN CR) 12.5 MG CR tablet Take 12.5 mg by mouth at bedtime as needed for sleep.  Historical Provider, MD    Family History No family history on file.  Social History Social History  Substance Use Topics  . Smoking status: Never Smoker  . Smokeless tobacco: Never Used  . Alcohol use No     Allergies   Peanut oil   Review of Systems Review of Systems  Constitutional: Positive for fever (Subjective).  HENT: Positive for dental problem (Right lower) and facial swelling (Right lower). Negative for trouble swallowing.   Respiratory: Negative for shortness of breath.      Physical Exam Updated Vital Signs BP (!) 166/87 (BP Location: Left Arm)   Pulse (!) 105   Temp 98.6 F (37 C) (Oral)   Resp 18   Ht 5\' 6"  (1.676 m)   Wt 280 lb (127 kg)    SpO2 100%   BMI 45.19 kg/m   Physical Exam  Constitutional: She appears well-developed and well-nourished. No distress.  HENT:  Head: Normocephalic and atraumatic.  Poor oral dentition noted, pain along tooth as depicted in image, midline uvula, no trismus, oropharynx moist and clear, no abscess noted, no oropharyngeal erythema or edema, neck supple and no tenderness. No facial edema  Neck: Neck supple.  Cardiovascular: Normal rate, regular rhythm and normal heart sounds.   No murmur heard. Pulmonary/Chest: Effort normal and breath sounds normal. No respiratory distress. She has no wheezes. She has no rales.  Musculoskeletal: Normal range of motion.  Neurological: She is alert.  Skin: Skin is warm and dry.  Nursing note and vitals reviewed.    ED Treatments / Results  DIAGNOSTIC STUDIES: Oxygen Saturation is 100% on RA, normal by my interpretation.    COORDINATION OF CARE: 4:14 PM- Pt advised of plan for treatment and pt agrees.  Labs (all labs ordered are listed, but only abnormal results are displayed) Labs Reviewed - No data to display  EKG  EKG Interpretation None       Radiology No results found.  Procedures Procedures (including critical care time)  Medications Ordered in ED Medications  oxyCODONE-acetaminophen (PERCOCET/ROXICET) 5-325 MG per tablet 1 tablet (1 tablet Oral Given 06/09/16 1620)     Initial Impression / Assessment and Plan / ED Course  I have reviewed the triage vital signs and the nursing notes.  Pertinent labs & imaging results that were available during my care of the patient were reviewed by me and considered in my medical decision making (see chart for details).    Patient with dentalgia. No abscess requiring immediate incision and drainage. Patient is afebrile, non toxic appearing, and swallowing secretions well. Exam not concerning for Ludwig's angina or pharyngeal abscess. Will treat with PenVK. Patient has an appoint with the  dentist mid-April. She has called to try to move up the appointment, but this was the earliest available. I stressed the importance of keeping dental follow up for ultimate management of dental pain even if sxs resolve. BP elevated in ED today- PCP follow up if a few days for recheck. Patient voices understanding and is agreeable to plan.  Final Clinical Impressions(s) / ED Diagnoses   Final diagnoses:  Dentalgia    New Prescriptions New Prescriptions   PENICILLIN V POTASSIUM (VEETID) 500 MG TABLET    Take 1 tablet (500 mg total) by mouth 4 (four) times daily.   I personally performed the services described in this documentation, which was scribed in my presence. The recorded information has been reviewed and is accurate.      [facility] Charae Depaolis, PA-C 06/09/16  [id number]    Geoffery Lyonsouglas Delo, MD 06/09/16 [phone]

## 2016-06-09 NOTE — ED Triage Notes (Signed)
c/o left lower toothache x 1 week--NAD-steady gait

## 2016-06-09 NOTE — Discharge Instructions (Signed)
You have a dental infection. It is very important that you get evaluated by a dentist as soon as possible. Ibuprofen as needed for pain. Take your full course of antibiotics. Read the instructions below.  Eat a soft or liquid diet and rinse your mouth out after meals with warm water. You should see a dentist or return here at once if you have increased swelling, increased pain or uncontrolled bleeding from the site of your injury.  SEEK MEDICAL CARE IF:  You have increased pain not controlled with medicines.  You have swelling around your tooth, in your face or neck.  You have bleeding which starts, continues, or gets worse.  You have a fever >101 If you are unable to open your mouth

## 2017-02-11 ENCOUNTER — Emergency Department (HOSPITAL_COMMUNITY): Payer: Medicaid Other

## 2017-02-11 ENCOUNTER — Encounter (HOSPITAL_COMMUNITY): Payer: Self-pay

## 2017-02-11 ENCOUNTER — Inpatient Hospital Stay (HOSPITAL_COMMUNITY)
Admission: EM | Admit: 2017-02-11 | Discharge: 2017-02-17 | DRG: 605 | Disposition: A | Discharge status: Home / Self Care | Payer: Medicaid Other | Attending: General Surgery | Admitting: General Surgery

## 2017-02-11 ENCOUNTER — Other Ambulatory Visit: Payer: Self-pay

## 2017-02-11 DIAGNOSIS — S50812A Abrasion of left forearm, initial encounter: Secondary | ICD-10-CM | POA: Diagnosis present

## 2017-02-11 DIAGNOSIS — M25561 Pain in right knee: Secondary | ICD-10-CM

## 2017-02-11 DIAGNOSIS — E7800 Pure hypercholesterolemia, unspecified: Secondary | ICD-10-CM | POA: Diagnosis present

## 2017-02-11 DIAGNOSIS — S8001XA Contusion of right knee, initial encounter: Secondary | ICD-10-CM

## 2017-02-11 DIAGNOSIS — I10 Essential (primary) hypertension: Secondary | ICD-10-CM | POA: Diagnosis present

## 2017-02-11 DIAGNOSIS — Z91018 Allergy to other foods: Secondary | ICD-10-CM

## 2017-02-11 DIAGNOSIS — S301XXA Contusion of abdominal wall, initial encounter: Secondary | ICD-10-CM | POA: Diagnosis present

## 2017-02-11 DIAGNOSIS — R000 Tachycardia, unspecified: Secondary | ICD-10-CM | POA: Diagnosis present

## 2017-02-11 DIAGNOSIS — M109 Gout, unspecified: Secondary | ICD-10-CM | POA: Diagnosis present

## 2017-02-11 DIAGNOSIS — E119 Type 2 diabetes mellitus without complications: Secondary | ICD-10-CM | POA: Diagnosis present

## 2017-02-11 DIAGNOSIS — S1091XA Abrasion of unspecified part of neck, initial encounter: Secondary | ICD-10-CM | POA: Diagnosis present

## 2017-02-11 DIAGNOSIS — Y92410 Unspecified street and highway as the place of occurrence of the external cause: Secondary | ICD-10-CM | POA: Diagnosis not present

## 2017-02-11 DIAGNOSIS — S3011XA Contusion of abdominal wall, initial encounter: Secondary | ICD-10-CM

## 2017-02-11 DIAGNOSIS — Z9102 Food additives allergy status: Secondary | ICD-10-CM

## 2017-02-11 DIAGNOSIS — T148XXA Other injury of unspecified body region, initial encounter: Secondary | ICD-10-CM

## 2017-02-11 DIAGNOSIS — Z7984 Long term (current) use of oral hypoglycemic drugs: Secondary | ICD-10-CM

## 2017-02-11 DIAGNOSIS — D649 Anemia, unspecified: Secondary | ICD-10-CM | POA: Diagnosis present

## 2017-02-11 HISTORY — DX: Contusion of right knee, initial encounter: S80.01XA

## 2017-02-11 LAB — COMPREHENSIVE METABOLIC PANEL
ALK PHOS: 160 U/L — AB (ref 38–126)
ALT: 16 U/L (ref 14–54)
AST: 26 U/L (ref 15–41)
Albumin: 3.3 g/dL — ABNORMAL LOW (ref 3.5–5.0)
Anion gap: 9 (ref 5–15)
BUN: 12 mg/dL (ref 6–20)
CALCIUM: 8.7 mg/dL — AB (ref 8.9–10.3)
CO2: 22 mmol/L (ref 22–32)
CREATININE: 0.94 mg/dL (ref 0.44–1.00)
Chloride: 103 mmol/L (ref 101–111)
GFR calc Af Amer: >60 mL/min (ref >60)
GFR calc non Af Amer: >60 mL/min (ref >60)
Glucose, Bld: 265 mg/dL — ABNORMAL HIGH (ref 65–99)
Potassium: 3.4 mmol/L — ABNORMAL LOW (ref 3.5–5.1)
Sodium: 134 mmol/L — ABNORMAL LOW (ref 135–145)
Total Bilirubin: 0.5 mg/dL (ref 0.3–1.2)
Total Protein: 7.7 g/dL (ref 6.5–8.1)

## 2017-02-11 LAB — I-STAT CHEM 8, ED
BUN: 15 mg/dL (ref 6–20)
CHLORIDE: 106 mmol/L (ref 101–111)
Calcium, Ion: 0.99 mmol/L — ABNORMAL LOW (ref 1.15–1.40)
Creatinine, Ser: 0.8 mg/dL (ref 0.44–1.00)
GLUCOSE: 276 mg/dL — AB (ref 65–99)
HCT: 42 % (ref 36.0–46.0)
Hemoglobin: 14.3 g/dL (ref 12.0–15.0)
POTASSIUM: 3.6 mmol/L (ref 3.5–5.1)
Sodium: 139 mmol/L (ref 135–145)
TCO2: 20 mmol/L — ABNORMAL LOW (ref 22–32)

## 2017-02-11 LAB — CBC
HEMATOCRIT: 38.3 % (ref 36.0–46.0)
HEMOGLOBIN: 12.6 g/dL (ref 12.0–15.0)
MCH: 28.3 pg (ref 26.0–34.0)
MCHC: 32.9 g/dL (ref 30.0–36.0)
MCV: 86.1 fL (ref 78.0–100.0)
PLATELETS: 460 10*3/uL — AB (ref 150–400)
RBC: 4.45 MIL/uL (ref 3.87–5.11)
RDW: 13.5 % (ref 11.5–15.5)
WBC: 17.7 10*3/uL — AB (ref 4.0–10.5)

## 2017-02-11 LAB — PROTIME-INR
INR: 1.08
Prothrombin Time: 13.9 seconds (ref 11.4–15.2)

## 2017-02-11 LAB — ETHANOL

## 2017-02-11 LAB — GLUCOSE, CAPILLARY: GLUCOSE-CAPILLARY: 268 mg/dL — AB (ref 65–99)

## 2017-02-11 MED ORDER — COLCHICINE 0.6 MG PO TABS
0.6000 mg | ORAL_TABLET | Freq: Every day | ORAL | Status: DC
  Administered 2017-02-11 – 2017-02-17 (×7): 0.6 mg via ORAL
  Filled 2017-02-11 (×7): qty 1

## 2017-02-11 MED ORDER — ONDANSETRON HCL 4 MG/2ML IJ SOLN: 4.0000 mg | Freq: Four times a day (QID) | INTRAMUSCULAR | Status: DC | PRN

## 2017-02-11 MED ORDER — MORPHINE SULFATE (PF) 4 MG/ML IV SOLN
2.0000 mg | INTRAVENOUS | Status: DC | PRN
  Administered 2017-02-11 – 2017-02-13 (×7): 4 mg via INTRAVENOUS
  Filled 2017-02-11 (×7): qty 1

## 2017-02-11 MED ORDER — INSULIN ASPART 100 UNIT/ML ~~LOC~~ SOLN
0.0000 [IU] | Freq: Three times a day (TID) | SUBCUTANEOUS | Status: DC
  Administered 2017-02-12: 4 [IU] via SUBCUTANEOUS
  Administered 2017-02-12: 7 [IU] via SUBCUTANEOUS
  Administered 2017-02-12: 4 [IU] via SUBCUTANEOUS
  Administered 2017-02-13 (×2): 11 [IU] via SUBCUTANEOUS
  Administered 2017-02-13: 4 [IU] via SUBCUTANEOUS
  Administered 2017-02-14 (×2): 7 [IU] via SUBCUTANEOUS
  Administered 2017-02-14: 4 [IU] via SUBCUTANEOUS
  Administered 2017-02-15: 7 [IU] via SUBCUTANEOUS
  Administered 2017-02-15: 11 [IU] via SUBCUTANEOUS
  Administered 2017-02-15: 4 [IU] via SUBCUTANEOUS
  Administered 2017-02-16 (×3): 7 [IU] via SUBCUTANEOUS
  Administered 2017-02-17 (×2): 4 [IU] via SUBCUTANEOUS

## 2017-02-11 MED ORDER — HYDRALAZINE HCL 20 MG/ML IJ SOLN: 10.0000 mg | INTRAMUSCULAR | Status: DC | PRN

## 2017-02-11 MED ORDER — HYDROMORPHONE HCL 1 MG/ML IJ SOLN
1.0000 mg | Freq: Once | INTRAMUSCULAR | Status: AC
Start: 2017-02-11 — End: 2017-02-11
  Administered 2017-02-11: 1 mg via INTRAVENOUS
  Filled 2017-02-11: qty 1

## 2017-02-11 MED ORDER — ONDANSETRON HCL 4 MG/2ML IJ SOLN
4.0000 mg | Freq: Once | INTRAMUSCULAR | Status: AC
  Administered 2017-02-11: 4 mg via INTRAVENOUS
  Filled 2017-02-11: qty 2

## 2017-02-11 MED ORDER — ALLOPURINOL 300 MG PO TABS
300.0000 mg | ORAL_TABLET | Freq: Every day | ORAL | Status: DC
  Administered 2017-02-11 – 2017-02-17 (×7): 300 mg via ORAL
  Filled 2017-02-11 (×7): qty 1

## 2017-02-11 MED ORDER — ONDANSETRON 4 MG PO TBDP: 4.0000 mg | ORAL_TABLET | Freq: Four times a day (QID) | ORAL | Status: DC | PRN

## 2017-02-11 MED ORDER — KCL IN DEXTROSE-NACL 20-5-0.45 MEQ/L-%-% IV SOLN
INTRAVENOUS | Status: DC
  Administered 2017-02-11 – 2017-02-12 (×2): via INTRAVENOUS
  Filled 2017-02-11 (×2): qty 1000

## 2017-02-11 MED ORDER — HYDROMORPHONE HCL 1 MG/ML IJ SOLN
1.0000 mg | Freq: Once | INTRAMUSCULAR | Status: AC
  Administered 2017-02-11: 1 mg via INTRAVENOUS
  Filled 2017-02-11: qty 1

## 2017-02-11 MED ORDER — IOPAMIDOL (ISOVUE-300) INJECTION 61%
INTRAVENOUS | Status: AC
  Administered 2017-02-11: 100 mL
  Filled 2017-02-11: qty 100

## 2017-02-11 MED ORDER — HYDROCODONE-ACETAMINOPHEN 5-325 MG PO TABS: 1.0000 | ORAL_TABLET | Freq: Three times a day (TID) | ORAL | 0 refills | Status: DC | PRN

## 2017-02-11 MED ORDER — SODIUM CHLORIDE 0.9 % IV BOLUS (SEPSIS)
1000.0000 mL | Freq: Once | INTRAVENOUS | Status: AC
  Administered 2017-02-11: 1000 mL via INTRAVENOUS

## 2017-02-11 MED ORDER — ACETAMINOPHEN 325 MG PO TABS
650.0000 mg | ORAL_TABLET | ORAL | Status: DC | PRN
  Administered 2017-02-13: 650 mg via ORAL
  Filled 2017-02-11: qty 2

## 2017-02-11 MED ORDER — ACETAMINOPHEN 500 MG PO TABS
1000.0000 mg | ORAL_TABLET | Freq: Once | ORAL | Status: DC
  Filled 2017-02-11: qty 2

## 2017-02-11 MED ORDER — PIOGLITAZONE HCL 30 MG PO TABS
30.0000 mg | ORAL_TABLET | Freq: Every day | ORAL | Status: DC
  Administered 2017-02-11 – 2017-02-17 (×7): 30 mg via ORAL
  Filled 2017-02-11 (×7): qty 1

## 2017-02-11 MED ORDER — OXYCODONE HCL 5 MG PO TABS
10.0000 mg | ORAL_TABLET | ORAL | Status: DC | PRN
  Administered 2017-02-11 – 2017-02-13 (×5): 10 mg via ORAL
  Filled 2017-02-11 (×5): qty 2

## 2017-02-11 MED ORDER — ACETAMINOPHEN 500 MG PO TABS: 1000.0000 mg | ORAL_TABLET | Freq: Three times a day (TID) | ORAL | 0 refills | Status: AC

## 2017-02-11 MED ORDER — SODIUM CHLORIDE 0.9 % IV BOLUS (SEPSIS)
125.0000 mL | Freq: Once | INTRAVENOUS | Status: AC
  Administered 2017-02-11: 125 mL via INTRAVENOUS

## 2017-02-11 MED ORDER — OXYCODONE HCL 5 MG PO TABS: 5.0000 mg | ORAL_TABLET | ORAL | Status: DC | PRN
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
## 2017-02-11 NOTE — ED Notes (Signed)
Report attempted 
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
## 2017-02-11 NOTE — Progress Notes (Signed)
Patient arrived to unit from ED. Alert and oriented x4. Family at bedside. Report received from [place], [place].

## 2017-02-11 NOTE — ED Notes (Signed)
Placed the patient on the perwick patient is resting with call bell in reach and family at bedside

## 2017-02-12 LAB — BASIC METABOLIC PANEL
ANION GAP: 8 (ref 5–15)
BUN: 10 mg/dL (ref 6–20)
CALCIUM: 8.3 mg/dL — AB (ref 8.9–10.3)
CO2: 22 mmol/L (ref 22–32)
Chloride: 105 mmol/L (ref 101–111)
Creatinine, Ser: 0.72 mg/dL (ref 0.44–1.00)
GFR calc non Af Amer: >60 mL/min (ref >60)
Glucose, Bld: 267 mg/dL — ABNORMAL HIGH (ref 65–99)
Potassium: 3.5 mmol/L (ref 3.5–5.1)
Sodium: 135 mmol/L (ref 135–145)

## 2017-02-12 LAB — URINALYSIS, ROUTINE W REFLEX MICROSCOPIC
BACTERIA UA: NONE SEEN
Bilirubin Urine: NEGATIVE
Glucose, UA: >=500 mg/dL — AB
Hgb urine dipstick: NEGATIVE
Ketones, ur: 20 mg/dL — AB
Leukocytes, UA: NEGATIVE
Nitrite: NEGATIVE
PROTEIN: NEGATIVE mg/dL
Specific Gravity, Urine: >1.046 — ABNORMAL HIGH (ref 1.005–1.030)
pH: 5 (ref 5.0–8.0)

## 2017-02-12 LAB — CBC
HEMATOCRIT: 31.7 % — AB (ref 36.0–46.0)
HEMOGLOBIN: 10.3 g/dL — AB (ref 12.0–15.0)
MCH: 28.1 pg (ref 26.0–34.0)
MCHC: 32.5 g/dL (ref 30.0–36.0)
MCV: 86.4 fL (ref 78.0–100.0)
Platelets: 421 10*3/uL — ABNORMAL HIGH (ref 150–400)
RBC: 3.67 MIL/uL — ABNORMAL LOW (ref 3.87–5.11)
RDW: 14.1 % (ref 11.5–15.5)
WBC: 9.8 10*3/uL (ref 4.0–10.5)

## 2017-02-12 LAB — GLUCOSE, CAPILLARY
GLUCOSE-CAPILLARY: 184 mg/dL — AB (ref 65–99)
GLUCOSE-CAPILLARY: 196 mg/dL — AB (ref 65–99)
Glucose-Capillary: 248 mg/dL — ABNORMAL HIGH (ref 65–99)
Glucose-Capillary: 212 mg/dL — ABNORMAL HIGH (ref 65–99)

## 2017-02-12 LAB — HIV ANTIBODY (ROUTINE TESTING W REFLEX): HIV SCREEN 4TH GENERATION: NONREACTIVE
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
## 2017-02-13 LAB — GLUCOSE, CAPILLARY
GLUCOSE-CAPILLARY: 258 mg/dL — AB (ref 65–99)
GLUCOSE-CAPILLARY: 169 mg/dL — AB (ref 65–99)
GLUCOSE-CAPILLARY: 141 mg/dL — AB (ref 65–99)
Glucose-Capillary: 259 mg/dL — ABNORMAL HIGH (ref 65–99)

## 2017-02-13 LAB — CBC
HCT: 32.3 % — ABNORMAL LOW (ref 36.0–46.0)
Hemoglobin: 10.8 g/dL — ABNORMAL LOW (ref 12.0–15.0)
MCH: 28.8 pg (ref 26.0–34.0)
MCHC: 33.4 g/dL (ref 30.0–36.0)
MCV: 86.1 fL (ref 78.0–100.0)
PLATELETS: 348 10*3/uL (ref 150–400)
RBC: 3.75 MIL/uL — ABNORMAL LOW (ref 3.87–5.11)
RDW: 13.8 % (ref 11.5–15.5)
WBC: 10 10*3/uL (ref 4.0–10.5)

## 2017-02-13 MED ORDER — ACETAMINOPHEN 500 MG PO TABS
1000.0000 mg | ORAL_TABLET | Freq: Three times a day (TID) | ORAL | Status: DC
  Administered 2017-02-13 – 2017-02-17 (×13): 1000 mg via ORAL
  Filled 2017-02-13 (×13): qty 2

## 2017-02-13 MED ORDER — TRAMADOL HCL 50 MG PO TABS: 50.0000 mg | ORAL_TABLET | Freq: Four times a day (QID) | ORAL | Status: DC | PRN

## 2017-02-13 MED ORDER — MORPHINE SULFATE (PF) 4 MG/ML IV SOLN: 2.0000 mg | INTRAVENOUS | Status: DC | PRN

## 2017-02-13 MED ORDER — DIPHENHYDRAMINE HCL 25 MG PO CAPS
25.0000 mg | ORAL_CAPSULE | Freq: Four times a day (QID) | ORAL | Status: DC | PRN
  Administered 2017-02-13 – 2017-02-15 (×3): 25 mg via ORAL
  Filled 2017-02-13 (×4): qty 1

## 2017-02-13 MED ORDER — IBUPROFEN 600 MG PO TABS
600.0000 mg | ORAL_TABLET | Freq: Four times a day (QID) | ORAL | Status: DC | PRN
  Administered 2017-02-14 – 2017-02-15 (×2): 600 mg via ORAL
  Filled 2017-02-13 (×2): qty 1

## 2017-02-13 MED ORDER — OXYCODONE HCL 5 MG PO TABS
5.0000 mg | ORAL_TABLET | ORAL | Status: DC | PRN
  Administered 2017-02-13 (×2): 10 mg via ORAL
  Administered 2017-02-13: 5 mg via ORAL
  Administered 2017-02-14 (×2): 10 mg via ORAL
  Filled 2017-02-13 (×5): qty 2
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
## 2017-02-14 ENCOUNTER — Inpatient Hospital Stay (HOSPITAL_COMMUNITY): Payer: Medicaid Other

## 2017-02-14 LAB — CBC
HEMATOCRIT: 29.6 % — AB (ref 36.0–46.0)
HEMOGLOBIN: 9.7 g/dL — AB (ref 12.0–15.0)
MCH: 28.1 pg (ref 26.0–34.0)
MCHC: 32.8 g/dL (ref 30.0–36.0)
MCV: 85.8 fL (ref 78.0–100.0)
Platelets: 367 10*3/uL (ref 150–400)
RBC: 3.45 MIL/uL — AB (ref 3.87–5.11)
RDW: 13.8 % (ref 11.5–15.5)
WBC: 7.3 10*3/uL (ref 4.0–10.5)

## 2017-02-14 LAB — GLUCOSE, CAPILLARY
GLUCOSE-CAPILLARY: 209 mg/dL — AB (ref 65–99)
GLUCOSE-CAPILLARY: 178 mg/dL — AB (ref 65–99)
Glucose-Capillary: 222 mg/dL — ABNORMAL HIGH (ref 65–99)
Glucose-Capillary: 206 mg/dL — ABNORMAL HIGH (ref 65–99)

## 2017-02-14 MED ORDER — METOPROLOL TARTRATE 12.5 MG HALF TABLET
12.5000 mg | ORAL_TABLET | Freq: Two times a day (BID) | ORAL | Status: DC
  Administered 2017-02-14 – 2017-02-17 (×7): 12.5 mg via ORAL
  Filled 2017-02-14 (×7): qty 1

## 2017-02-14 MED ORDER — IOPAMIDOL (ISOVUE-370) INJECTION 76%
INTRAVENOUS | Status: AC
  Filled 2017-02-14: qty 100

## 2017-02-14 MED ORDER — OXYCODONE HCL 5 MG PO TABS
5.0000 mg | ORAL_TABLET | ORAL | Status: DC | PRN
  Administered 2017-02-14: 15 mg via ORAL
  Administered 2017-02-14: 10 mg via ORAL
  Administered 2017-02-14 – 2017-02-17 (×12): 15 mg via ORAL
  Filled 2017-02-14 (×13): qty 3
  Filled 2017-02-14: qty 2

## 2017-02-14 MED ORDER — MORPHINE SULFATE (PF) 4 MG/ML IV SOLN: 0.5000 mg | INTRAVENOUS | Status: DC | PRN

## 2017-02-14 MED ORDER — ENOXAPARIN SODIUM 40 MG/0.4ML ~~LOC~~ SOLN
40.0000 mg | SUBCUTANEOUS | Status: DC
  Administered 2017-02-14 – 2017-02-17 (×4): 40 mg via SUBCUTANEOUS
  Filled 2017-02-14 (×4): qty 0.4

## 2017-02-14 MED ORDER — BACITRACIN ZINC 500 UNIT/GM EX OINT
TOPICAL_OINTMENT | Freq: Two times a day (BID) | CUTANEOUS | Status: DC
  Administered 2017-02-14 – 2017-02-16 (×5): via TOPICAL
  Administered 2017-02-16: 1 via TOPICAL
  Filled 2017-02-14: qty 28.35
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
## 2017-02-14 NOTE — Progress Notes (Signed)
Inpatient Diabetes Program Recommendations  AACE/ADA: New Consensus Statement on Inpatient Glycemic Control (2015)  Target Ranges:  Prepandial:   less than 140 mg/dL      Peak postprandial:   less than 180 mg/dL (1-2 hours)      Critically ill patients:  140 - 180 mg/dL   Lab Results  Component Value Date   GLUCAP 209 (H) 02/14/2017    Review of Glycemic Control Results for Tracey Castillo, [place] (MRN [id number]) as of 02/14/2017 14:48  Ref. Range 02/13/2017 11:37 02/13/2017 17:06 02/13/2017 21:51 02/14/2017 07:40 02/14/2017 11:16  Glucose-Capillary Latest Ref Range: 65 - 99 mg/dL 132169 (H) 440259 (H) 102141 (H) 222 (H) 209 (H)   Diabetes history: DM2 Outpatient Diabetes medications: Glumetza 1 gm bid + Actos 30 qd + Onglyza 5 qd Current orders for Inpatient glycemic control: Actis 30 mg qd + Novolog correction resistant tid  Inpatient Diabetes Program Recommendations:    While Glumetza held, please consider: -Lantus 26 units daily (0.2 units/kg x 131 kg)  Thank you, Darel HongJudy E. Carriann Hesse, RN, MSN, CDE  Diabetes Coordinator Inpatient Glycemic Control Team Team Pager [phone] (8am-5pm) 02/14/2017 3:04 PM

## 2017-02-14 NOTE — Progress Notes (Signed)
CT scan cannot be done without at least a 20-gauge IV catheter. RN and VAS nurses were unable to place a 20-gauge. She does have a new 22-gauge that is patient.
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
## 2017-02-15 LAB — GLUCOSE, CAPILLARY
GLUCOSE-CAPILLARY: 237 mg/dL — AB (ref 65–99)
GLUCOSE-CAPILLARY: 274 mg/dL — AB (ref 65–99)
Glucose-Capillary: 195 mg/dL — ABNORMAL HIGH (ref 65–99)
Glucose-Capillary: 122 mg/dL — ABNORMAL HIGH (ref 65–99)

## 2017-02-15 MED ORDER — MAGNESIUM HYDROXIDE 400 MG/5ML PO SUSP: 30.0000 mL | Freq: Every evening | ORAL | Status: DC | PRN

## 2017-02-15 MED ORDER — DOCUSATE SODIUM 100 MG PO CAPS
200.0000 mg | ORAL_CAPSULE | Freq: Two times a day (BID) | ORAL | Status: DC
  Administered 2017-02-15 – 2017-02-17 (×4): 200 mg via ORAL
  Filled 2017-02-15 (×4): qty 2
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
## 2017-02-15 NOTE — Progress Notes (Signed)
CT Angio not done yesterday.IV team attempted to start gauge 20 IV access several times but were not able to. Pt already refusing more IV stick.
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
## 2017-02-16 LAB — CBC
HCT: 27.2 % — ABNORMAL LOW (ref 36.0–46.0)
HEMOGLOBIN: 8.8 g/dL — AB (ref 12.0–15.0)
MCH: 27.9 pg (ref 26.0–34.0)
MCHC: 32.4 g/dL (ref 30.0–36.0)
MCV: 86.3 fL (ref 78.0–100.0)
PLATELETS: 398 10*3/uL (ref 150–400)
RBC: 3.15 MIL/uL — AB (ref 3.87–5.11)
RDW: 14.2 % (ref 11.5–15.5)
WBC: 5.2 10*3/uL (ref 4.0–10.5)

## 2017-02-16 LAB — GLUCOSE, CAPILLARY
GLUCOSE-CAPILLARY: 210 mg/dL — AB (ref 65–99)
GLUCOSE-CAPILLARY: 218 mg/dL — AB (ref 65–99)
GLUCOSE-CAPILLARY: 233 mg/dL — AB (ref 65–99)
Glucose-Capillary: 156 mg/dL — ABNORMAL HIGH (ref 65–99)

## 2017-02-16 MED ORDER — INSULIN GLARGINE 100 UNIT/ML ~~LOC~~ SOLN
26.0000 [IU] | Freq: Every day | SUBCUTANEOUS | Status: DC
  Administered 2017-02-16 – 2017-02-17 (×2): 26 [IU] via SUBCUTANEOUS
  Filled 2017-02-16 (×2): qty 0.26
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
## 2017-02-16 NOTE — Progress Notes (Signed)
Inpatient Diabetes Program Recommendations  AACE/ADA: New Consensus Statement on Inpatient Glycemic Control (2015)  Target Ranges:  Prepandial:   less than 140 mg/dL      Peak postprandial:   less than 180 mg/dL (1-2 hours)      Critically ill patients:  140 - 180 mg/dL   Lab Results  Component Value Date   GLUCAP 210 (H) 02/16/2017    Review of Glycemic Control Results for Tracey Castillo, Tracey Castillo (MRN [id number]) as of 02/16/2017 10:57  Ref. Range 02/15/2017 07:40 02/15/2017 12:04 02/15/2017 16:52 02/15/2017 21:20 02/16/2017 07:47  Glucose-Capillary Latest Ref Range: 65 - 99 mg/dL 409237 (H) 811274 (H) 914195 (H) 122 (H) 210 (H)   Diabetes history: DM2 Outpatient Diabetes medications: Glumetza 1 gm bid + Actos 30 qd + Onglyza 5 qd Current orders for Inpatient glycemic control: Actis 30 mg qd + Novolog correction resistant tid  Inpatient Diabetes Program Recommendations:    While Glumetza held, please consider: -Lantus 26 units daily (0.2 units/kg x 131 kg) -A1c to determine prehospital glycemic control  Thank you, Darel HongJudy E. Kathie Posa, RN, MSN, CDE  Diabetes Coordinator Inpatient Glycemic Control Team Team Pager [phone] (8am-5pm) 02/16/2017 10:59 AM
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
## 2017-02-17 ENCOUNTER — Encounter (HOSPITAL_COMMUNITY): Payer: Self-pay | Admitting: General Surgery

## 2017-02-17 DIAGNOSIS — S8001XA Contusion of right knee, initial encounter: Secondary | ICD-10-CM

## 2017-02-17 HISTORY — DX: Contusion of right knee, initial encounter: S80.01XA

## 2017-02-17 LAB — CBC
HCT: 28.9 % — ABNORMAL LOW (ref 36.0–46.0)
HEMOGLOBIN: 9.3 g/dL — AB (ref 12.0–15.0)
MCH: 28 pg (ref 26.0–34.0)
MCHC: 32.2 g/dL (ref 30.0–36.0)
MCV: 87 fL (ref 78.0–100.0)
PLATELETS: 461 10*3/uL — AB (ref 150–400)
RBC: 3.32 MIL/uL — AB (ref 3.87–5.11)
RDW: 14.3 % (ref 11.5–15.5)
WBC: 7 10*3/uL (ref 4.0–10.5)

## 2017-02-17 LAB — HEMOGLOBIN A1C
HEMOGLOBIN A1C: 9.1 % — AB (ref 4.8–5.6)
MEAN PLASMA GLUCOSE: 214.47 mg/dL

## 2017-02-17 LAB — GLUCOSE, CAPILLARY
GLUCOSE-CAPILLARY: 167 mg/dL — AB (ref 65–99)
Glucose-Capillary: 198 mg/dL — ABNORMAL HIGH (ref 65–99)

## 2017-02-17 MED ORDER — HYDROCODONE-ACETAMINOPHEN 5-325 MG PO TABS: 1.0000 | ORAL_TABLET | Freq: Three times a day (TID) | ORAL | 0 refills | Status: AC | PRN

## 2017-02-17 NOTE — Clinical Social Work Note (Signed)
Clinical Social Worker continuing to follow patient and family for support and discharge planning needs.  Patient plans to discharge home today with equipment and family to provide transportation.  Clinical Social Worker will sign off for now as social work intervention is no longer needed. Please consult us again if new need arises.  Tracey Castillo, Kentucky[place]CS[place] [phone]

## 2017-02-17 NOTE — Progress Notes (Signed)
Discharge paperwork reviewed with patient. No questions verbalized. Patient is ready to discharge.  
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
## 2017-02-17 NOTE — Discharge Instructions (Signed)
Continue to apply ice and compression to the abdominal wall hematoma over the next couple of days.  This will slowly be reabsorbed by the body over several weeks.  For the neck and arm abrasions, you can use moisturizing ointments such as A and D ointment, or Neosporin.   Contusion A contusion is a deep bruise. Contusions are the result of a blunt injury to tissues and muscle fibers under the skin. The injury causes bleeding under the skin. The skin overlying the contusion may turn blue, purple, or yellow. Minor injuries will give you a painless contusion, but more severe contusions may stay painful and swollen for a few weeks. What are the causes? This condition is usually caused by a blow, trauma, or direct force to an area of the body. What are the signs or symptoms? Symptoms of this condition include:  Swelling of the injured area.  Pain and tenderness in the injured area.  Discoloration. The area may have redness and then turn blue, purple, or yellow.  How is this diagnosed? This condition is diagnosed based on a physical exam and medical history. An X-ray, CT scan, or MRI may be needed to determine if there are any associated injuries, such as broken bones (fractures). How is this treated? Specific treatment for this condition depends on what area of the body was injured. In general, the best treatment for a contusion is resting, icing, applying pressure to (compression), and elevating the injured area. This is often called the RICE strategy. Over-the-counter anti-inflammatory medicines may also be recommended for pain control. Follow these instructions at home:  Rest the injured area.  If directed, apply ice to the injured area: ? Put ice in a plastic bag. ? Place a towel between your skin and the bag. ? Leave the ice on for 20 minutes, 2-3 times per day.  If directed, apply light compression to the injured area using an elastic bandage. Make sure the bandage is not wrapped too  tightly. Remove and reapply the bandage as directed by your health care provider.  If possible, raise (elevate) the injured area above the level of your heart while you are sitting or lying down.  Take over-the-counter and prescription medicines only as told by your health care provider. Contact a health care provider if:  Your symptoms do not improve after several days of treatment.  Your symptoms get worse.  You have difficulty moving the injured area. Get help right away if:  You have severe pain.  You have numbness in a hand or foot.  Your hand or foot turns pale or cold. This information is not intended to replace advice given to you by your health care provider. Make sure you discuss any questions you have with your health care provider. Document Released: 12/08/2004 Document Revised: 07/09/2015 Document Reviewed: 07/16/2014 Elsevier Interactive Patient Education  2017 [employer]

## 2017-03-02 ENCOUNTER — Ambulatory Visit: Payer: No Typology Code available for payment source | Attending: Physician Assistant | Admitting: Physical Therapy

## 2017-03-03 ENCOUNTER — Encounter (HOSPITAL_BASED_OUTPATIENT_CLINIC_OR_DEPARTMENT_OTHER): Payer: Self-pay

## 2017-03-03 ENCOUNTER — Other Ambulatory Visit: Payer: Self-pay

## 2017-03-03 ENCOUNTER — Emergency Department (HOSPITAL_BASED_OUTPATIENT_CLINIC_OR_DEPARTMENT_OTHER)
Admission: EM | Admit: 2017-03-03 | Discharge: 2017-03-03 | Disposition: A | Discharge status: Home / Self Care | Payer: Medicaid Other | Attending: Emergency Medicine | Admitting: Emergency Medicine

## 2017-03-03 DIAGNOSIS — Z79899 Other long term (current) drug therapy: Secondary | ICD-10-CM | POA: Insufficient documentation

## 2017-03-03 DIAGNOSIS — Z7984 Long term (current) use of oral hypoglycemic drugs: Secondary | ICD-10-CM | POA: Insufficient documentation

## 2017-03-03 DIAGNOSIS — E119 Type 2 diabetes mellitus without complications: Secondary | ICD-10-CM | POA: Diagnosis not present

## 2017-03-03 DIAGNOSIS — I10 Essential (primary) hypertension: Secondary | ICD-10-CM | POA: Insufficient documentation

## 2017-03-03 DIAGNOSIS — S301XXD Contusion of abdominal wall, subsequent encounter: Secondary | ICD-10-CM

## 2017-03-03 DIAGNOSIS — R519 Headache, unspecified: Secondary | ICD-10-CM

## 2017-03-03 DIAGNOSIS — E7800 Pure hypercholesterolemia, unspecified: Secondary | ICD-10-CM | POA: Insufficient documentation

## 2017-03-03 DIAGNOSIS — R51 Headache: Secondary | ICD-10-CM | POA: Insufficient documentation

## 2017-03-03 MED ORDER — SODIUM CHLORIDE 0.9 % IV BOLUS (SEPSIS)
1000.0000 mL | Freq: Once | INTRAVENOUS | Status: AC
  Administered 2017-03-03: 1000 mL via INTRAVENOUS

## 2017-03-03 MED ORDER — PROCHLORPERAZINE EDISYLATE 5 MG/ML IJ SOLN
10.0000 mg | Freq: Once | INTRAMUSCULAR | Status: AC
  Administered 2017-03-03: 10 mg via INTRAVENOUS
  Filled 2017-03-03: qty 2

## 2017-03-03 MED ORDER — DIPHENHYDRAMINE HCL 50 MG/ML IJ SOLN
25.0000 mg | Freq: Once | INTRAMUSCULAR | Status: AC
  Administered 2017-03-03: 25 mg via INTRAVENOUS
  Filled 2017-03-03: qty 1

## 2017-03-03 NOTE — ED Notes (Signed)
IV attempt x1 by this RN. 

## 2017-03-03 NOTE — ED Notes (Signed)
Adam RN to attempt ultrasound IV
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
## 2017-03-20 ENCOUNTER — Other Ambulatory Visit: Payer: Self-pay

## 2017-03-20 ENCOUNTER — Ambulatory Visit: Payer: No Typology Code available for payment source | Attending: Physician Assistant | Admitting: Physical Therapy

## 2017-03-20 DIAGNOSIS — M6281 Muscle weakness (generalized): Secondary | ICD-10-CM | POA: Insufficient documentation

## 2017-03-20 DIAGNOSIS — M25561 Pain in right knee: Secondary | ICD-10-CM | POA: Insufficient documentation

## 2017-03-20 DIAGNOSIS — M25661 Stiffness of right knee, not elsewhere classified: Secondary | ICD-10-CM | POA: Insufficient documentation

## 2017-03-20 DIAGNOSIS — R262 Difficulty in walking, not elsewhere classified: Secondary | ICD-10-CM

## 2017-03-20 DIAGNOSIS — R2689 Other abnormalities of gait and mobility: Secondary | ICD-10-CM | POA: Insufficient documentation
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
## 2017-03-23 ENCOUNTER — Ambulatory Visit: Payer: No Typology Code available for payment source

## 2017-03-28 ENCOUNTER — Ambulatory Visit: Payer: No Typology Code available for payment source

## 2017-03-28 DIAGNOSIS — R2689 Other abnormalities of gait and mobility: Secondary | ICD-10-CM

## 2017-03-28 DIAGNOSIS — M25561 Pain in right knee: Secondary | ICD-10-CM

## 2017-03-28 DIAGNOSIS — M25661 Stiffness of right knee, not elsewhere classified: Secondary | ICD-10-CM

## 2017-03-28 DIAGNOSIS — R262 Difficulty in walking, not elsewhere classified: Secondary | ICD-10-CM

## 2017-03-28 DIAGNOSIS — M6281 Muscle weakness (generalized): Secondary | ICD-10-CM
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
## 2017-03-30 ENCOUNTER — Ambulatory Visit: Payer: No Typology Code available for payment source

## 2017-04-04 ENCOUNTER — Ambulatory Visit: Payer: No Typology Code available for payment source

## 2017-04-06 ENCOUNTER — Ambulatory Visit: Payer: No Typology Code available for payment source

## 2017-04-11 ENCOUNTER — Ambulatory Visit: Payer: No Typology Code available for payment source

## 2017-04-13 ENCOUNTER — Ambulatory Visit: Payer: No Typology Code available for payment source | Admitting: Physical Therapy

## 2017-04-13 ENCOUNTER — Encounter: Payer: Self-pay | Admitting: Physical Therapy

## 2017-04-13 DIAGNOSIS — R262 Difficulty in walking, not elsewhere classified: Secondary | ICD-10-CM

## 2017-04-13 DIAGNOSIS — M6281 Muscle weakness (generalized): Secondary | ICD-10-CM

## 2017-04-13 DIAGNOSIS — R2689 Other abnormalities of gait and mobility: Secondary | ICD-10-CM

## 2017-04-13 DIAGNOSIS — M25661 Stiffness of right knee, not elsewhere classified: Secondary | ICD-10-CM

## 2017-04-13 DIAGNOSIS — M25561 Pain in right knee: Secondary | ICD-10-CM
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
## 2017-04-18 ENCOUNTER — Ambulatory Visit: Payer: No Typology Code available for payment source

## 2017-04-20 ENCOUNTER — Ambulatory Visit: Payer: No Typology Code available for payment source

## 2017-04-25 ENCOUNTER — Ambulatory Visit: Payer: No Typology Code available for payment source

## 2017-04-27 ENCOUNTER — Ambulatory Visit: Payer: No Typology Code available for payment source | Admitting: Physical Therapy

## 2017-05-02 ENCOUNTER — Ambulatory Visit: Payer: No Typology Code available for payment source

## 2017-05-04 ENCOUNTER — Ambulatory Visit: Payer: No Typology Code available for payment source

## 2017-05-09 ENCOUNTER — Ambulatory Visit: Payer: No Typology Code available for payment source

## 2017-05-11 ENCOUNTER — Ambulatory Visit: Payer: No Typology Code available for payment source | Admitting: Physical Therapy

## 2019-07-30 ENCOUNTER — Other Ambulatory Visit: Payer: Self-pay

## 2019-07-30 ENCOUNTER — Encounter (HOSPITAL_BASED_OUTPATIENT_CLINIC_OR_DEPARTMENT_OTHER): Payer: Self-pay | Admitting: *Deleted

## 2019-07-30 ENCOUNTER — Emergency Department (HOSPITAL_BASED_OUTPATIENT_CLINIC_OR_DEPARTMENT_OTHER): Payer: Medicaid Other

## 2019-07-30 ENCOUNTER — Inpatient Hospital Stay (HOSPITAL_BASED_OUTPATIENT_CLINIC_OR_DEPARTMENT_OTHER)
Admission: EM | Admit: 2019-07-30 | Discharge: 2019-08-04 | DRG: 638 | Disposition: A | Discharge status: Home / Self Care | Payer: Medicaid Other | Attending: Internal Medicine | Admitting: Internal Medicine

## 2019-07-30 ENCOUNTER — Inpatient Hospital Stay (HOSPITAL_BASED_OUTPATIENT_CLINIC_OR_DEPARTMENT_OTHER): Payer: Medicaid Other

## 2019-07-30 DIAGNOSIS — J449 Chronic obstructive pulmonary disease, unspecified: Secondary | ICD-10-CM | POA: Diagnosis present

## 2019-07-30 DIAGNOSIS — M109 Gout, unspecified: Secondary | ICD-10-CM | POA: Diagnosis present

## 2019-07-30 DIAGNOSIS — Z90710 Acquired absence of both cervix and uterus: Secondary | ICD-10-CM

## 2019-07-30 DIAGNOSIS — I248 Other forms of acute ischemic heart disease: Secondary | ICD-10-CM | POA: Diagnosis present

## 2019-07-30 DIAGNOSIS — Z9111 Patient's noncompliance with dietary regimen: Secondary | ICD-10-CM

## 2019-07-30 DIAGNOSIS — Z9049 Acquired absence of other specified parts of digestive tract: Secondary | ICD-10-CM | POA: Diagnosis not present

## 2019-07-30 DIAGNOSIS — Z20822 Contact with and (suspected) exposure to covid-19: Secondary | ICD-10-CM | POA: Diagnosis present

## 2019-07-30 DIAGNOSIS — E860 Dehydration: Secondary | ICD-10-CM | POA: Diagnosis present

## 2019-07-30 DIAGNOSIS — R112 Nausea with vomiting, unspecified: Secondary | ICD-10-CM | POA: Diagnosis not present

## 2019-07-30 DIAGNOSIS — N179 Acute kidney failure, unspecified: Secondary | ICD-10-CM | POA: Diagnosis present

## 2019-07-30 DIAGNOSIS — I10 Essential (primary) hypertension: Secondary | ICD-10-CM | POA: Diagnosis present

## 2019-07-30 DIAGNOSIS — D72829 Elevated white blood cell count, unspecified: Secondary | ICD-10-CM | POA: Diagnosis present

## 2019-07-30 DIAGNOSIS — E876 Hypokalemia: Secondary | ICD-10-CM | POA: Diagnosis present

## 2019-07-30 DIAGNOSIS — Z79899 Other long term (current) drug therapy: Secondary | ICD-10-CM

## 2019-07-30 DIAGNOSIS — F329 Major depressive disorder, single episode, unspecified: Secondary | ICD-10-CM | POA: Diagnosis present

## 2019-07-30 DIAGNOSIS — E7800 Pure hypercholesterolemia, unspecified: Secondary | ICD-10-CM | POA: Diagnosis present

## 2019-07-30 DIAGNOSIS — G8929 Other chronic pain: Secondary | ICD-10-CM | POA: Diagnosis present

## 2019-07-30 DIAGNOSIS — Z6841 Body Mass Index (BMI) 40.0 and over, adult: Secondary | ICD-10-CM

## 2019-07-30 DIAGNOSIS — K219 Gastro-esophageal reflux disease without esophagitis: Secondary | ICD-10-CM | POA: Diagnosis present

## 2019-07-30 DIAGNOSIS — R079 Chest pain, unspecified: Secondary | ICD-10-CM | POA: Diagnosis not present

## 2019-07-30 DIAGNOSIS — E785 Hyperlipidemia, unspecified: Secondary | ICD-10-CM | POA: Diagnosis present

## 2019-07-30 DIAGNOSIS — E869 Volume depletion, unspecified: Secondary | ICD-10-CM | POA: Diagnosis present

## 2019-07-30 DIAGNOSIS — E1110 Type 2 diabetes mellitus with ketoacidosis without coma: Principal | ICD-10-CM | POA: Diagnosis present

## 2019-07-30 DIAGNOSIS — Z794 Long term (current) use of insulin: Secondary | ICD-10-CM

## 2019-07-30 LAB — POCT I-STAT EG7
Acid-base deficit: 8 mmol/L — ABNORMAL HIGH (ref 0.0–2.0)
Bicarbonate: 12.2 mmol/L — ABNORMAL LOW (ref 20.0–28.0)
Calcium, Ion: 1.06 mmol/L — ABNORMAL LOW (ref 1.15–1.40)
HCT: 48 % — ABNORMAL HIGH (ref 36.0–46.0)
Hemoglobin: 16.3 g/dL — ABNORMAL HIGH (ref 12.0–15.0)
O2 Saturation: 99 %
Potassium: 4.3 mmol/L (ref 3.5–5.1)
Sodium: 135 mmol/L (ref 135–145)
TCO2: 13 mmol/L — ABNORMAL LOW (ref 22–32)
pCO2, Ven: 16.3 mmHg — CL (ref 44.0–60.0)
pH, Ven: 7.481 — ABNORMAL HIGH (ref 7.250–7.430)
pO2, Ven: 124 mmHg — ABNORMAL HIGH (ref 32.0–45.0)

## 2019-07-30 LAB — CBG MONITORING, ED
Glucose-Capillary: >600 mg/dL (ref 70–99)
Glucose-Capillary: 583 mg/dL (ref 70–99)
Glucose-Capillary: 588 mg/dL (ref 70–99)
Glucose-Capillary: >600 mg/dL (ref 70–99)
Glucose-Capillary: 552 mg/dL (ref 70–99)
Glucose-Capillary: 570 mg/dL (ref 70–99)
Glucose-Capillary: 501 mg/dL (ref 70–99)
Glucose-Capillary: 494 mg/dL — ABNORMAL HIGH (ref 70–99)
Glucose-Capillary: 498 mg/dL — ABNORMAL HIGH (ref 70–99)

## 2019-07-30 LAB — HEPATIC FUNCTION PANEL
ALT: 78 U/L — ABNORMAL HIGH (ref 0–44)
AST: 48 U/L — ABNORMAL HIGH (ref 15–41)
Albumin: 4 g/dL (ref 3.5–5.0)
Alkaline Phosphatase: 219 U/L — ABNORMAL HIGH (ref 38–126)
Bilirubin, Direct: <0.1 mg/dL (ref 0.0–0.2)
Total Bilirubin: 1.2 mg/dL (ref 0.3–1.2)
Total Protein: 9.8 g/dL — ABNORMAL HIGH (ref 6.5–8.1)

## 2019-07-30 LAB — CBC WITH DIFFERENTIAL/PLATELET
Abs Immature Granulocytes: 0.06 10*3/uL (ref 0.00–0.07)
Basophils Absolute: 0 10*3/uL (ref 0.0–0.1)
Basophils Relative: 0 %
Eosinophils Absolute: 0 10*3/uL (ref 0.0–0.5)
Eosinophils Relative: 0 %
HCT: 44.4 % (ref 36.0–46.0)
Hemoglobin: 14.7 g/dL (ref 12.0–15.0)
Immature Granulocytes: 0 %
Lymphocytes Relative: 8 %
Lymphs Abs: 1 10*3/uL (ref 0.7–4.0)
MCH: 28.2 pg (ref 26.0–34.0)
MCHC: 33.1 g/dL (ref 30.0–36.0)
MCV: 85.1 fL (ref 80.0–100.0)
Monocytes Absolute: 0.7 10*3/uL (ref 0.1–1.0)
Monocytes Relative: 5 %
Neutro Abs: 11.7 10*3/uL — ABNORMAL HIGH (ref 1.7–7.7)
Neutrophils Relative %: 87 %
Platelets: 442 10*3/uL — ABNORMAL HIGH (ref 150–400)
RBC: 5.22 MIL/uL — ABNORMAL HIGH (ref 3.87–5.11)
RDW: 14.8 % (ref 11.5–15.5)
WBC: 13.6 10*3/uL — ABNORMAL HIGH (ref 4.0–10.5)
nRBC: 0 % (ref 0.0–0.2)

## 2019-07-30 LAB — GLUCOSE, CAPILLARY
Glucose-Capillary: 367 mg/dL — ABNORMAL HIGH (ref 70–99)
Glucose-Capillary: 445 mg/dL — ABNORMAL HIGH (ref 70–99)
Glucose-Capillary: 370 mg/dL — ABNORMAL HIGH (ref 70–99)
Glucose-Capillary: 326 mg/dL — ABNORMAL HIGH (ref 70–99)
Glucose-Capillary: 220 mg/dL — ABNORMAL HIGH (ref 70–99)

## 2019-07-30 LAB — BASIC METABOLIC PANEL
Anion gap: 30 — ABNORMAL HIGH (ref 5–15)
Anion gap: 16 — ABNORMAL HIGH (ref 5–15)
Anion gap: 14 (ref 5–15)
BUN: 15 mg/dL (ref 6–20)
BUN: 14 mg/dL (ref 6–20)
BUN: 15 mg/dL (ref 6–20)
CO2: 10 mmol/L — ABNORMAL LOW (ref 22–32)
CO2: 18 mmol/L — ABNORMAL LOW (ref 22–32)
CO2: 22 mmol/L (ref 22–32)
Calcium: 9.4 mg/dL (ref 8.9–10.3)
Calcium: 8.6 mg/dL — ABNORMAL LOW (ref 8.9–10.3)
Calcium: 8.8 mg/dL — ABNORMAL LOW (ref 8.9–10.3)
Chloride: 93 mmol/L — ABNORMAL LOW (ref 98–111)
Chloride: 103 mmol/L (ref 98–111)
Chloride: 105 mmol/L (ref 98–111)
Creatinine, Ser: 1.65 mg/dL — ABNORMAL HIGH (ref 0.44–1.00)
Creatinine, Ser: 1.2 mg/dL — ABNORMAL HIGH (ref 0.44–1.00)
Creatinine, Ser: 1.29 mg/dL — ABNORMAL HIGH (ref 0.44–1.00)
GFR calc Af Amer: 40 mL/min — ABNORMAL LOW (ref >60)
GFR calc Af Amer: 58 mL/min — ABNORMAL LOW (ref >60)
GFR calc Af Amer: 53 mL/min — ABNORMAL LOW (ref >60)
GFR calc non Af Amer: 34 mL/min — ABNORMAL LOW (ref >60)
GFR calc non Af Amer: 50 mL/min — ABNORMAL LOW (ref >60)
GFR calc non Af Amer: 46 mL/min — ABNORMAL LOW (ref >60)
Glucose, Bld: 737 mg/dL (ref 70–99)
Glucose, Bld: 392 mg/dL — ABNORMAL HIGH (ref 70–99)
Glucose, Bld: 320 mg/dL — ABNORMAL HIGH (ref 70–99)
Potassium: 4.3 mmol/L (ref 3.5–5.1)
Potassium: 3.7 mmol/L (ref 3.5–5.1)
Potassium: 4 mmol/L (ref 3.5–5.1)
Sodium: 133 mmol/L — ABNORMAL LOW (ref 135–145)
Sodium: 137 mmol/L (ref 135–145)
Sodium: 141 mmol/L (ref 135–145)

## 2019-07-30 LAB — URINALYSIS, MICROSCOPIC (REFLEX): RBC / HPF: NONE SEEN RBC/hpf (ref 0–5)

## 2019-07-30 LAB — URINALYSIS, ROUTINE W REFLEX MICROSCOPIC
Glucose, UA: >=500 mg/dL — AB
Hgb urine dipstick: NEGATIVE
Ketones, ur: >80 mg/dL — AB
Leukocytes,Ua: NEGATIVE
Nitrite: NEGATIVE
Protein, ur: NEGATIVE mg/dL
Specific Gravity, Urine: 1.025 (ref 1.005–1.030)
pH: 5 (ref 5.0–8.0)

## 2019-07-30 LAB — TROPONIN I (HIGH SENSITIVITY)
Troponin I (High Sensitivity): 36 ng/L — ABNORMAL HIGH (ref <18)
Troponin I (High Sensitivity): 45 ng/L — ABNORMAL HIGH (ref <18)

## 2019-07-30 LAB — LACTATE DEHYDROGENASE: LDH: 195 U/L — ABNORMAL HIGH (ref 98–192)

## 2019-07-30 LAB — MRSA PCR SCREENING: MRSA by PCR: NEGATIVE

## 2019-07-30 LAB — LIPASE, BLOOD: Lipase: 29 U/L (ref 11–51)

## 2019-07-30 LAB — SARS CORONAVIRUS 2 BY RT PCR (HOSPITAL ORDER, PERFORMED IN ~~LOC~~ HOSPITAL LAB): SARS Coronavirus 2: NEGATIVE

## 2019-07-30 LAB — BETA-HYDROXYBUTYRIC ACID: Beta-Hydroxybutyric Acid: >8 mmol/L — ABNORMAL HIGH (ref 0.05–0.27)

## 2019-07-30 LAB — MAGNESIUM: Magnesium: 1.7 mg/dL (ref 1.7–2.4)

## 2019-07-30 MED ORDER — HYDROMORPHONE HCL 1 MG/ML IJ SOLN
1.0000 mg | INTRAMUSCULAR | Status: DC | PRN
  Administered 2019-07-30: 1 mg via INTRAVENOUS
  Administered 2019-07-31: 2 mg via INTRAVENOUS
  Administered 2019-08-01 (×2): 1 mg via INTRAVENOUS
  Administered 2019-08-01: 2 mg via INTRAVENOUS
  Administered 2019-08-01: 1 mg via INTRAVENOUS
  Administered 2019-08-02: 2 mg via INTRAVENOUS
  Administered 2019-08-02 (×2): 1 mg via INTRAVENOUS
  Filled 2019-07-30: qty 1
  Filled 2019-07-30 (×3): qty 2
  Filled 2019-07-30 (×5): qty 1

## 2019-07-30 MED ORDER — INSULIN REGULAR(HUMAN) IN NACL 100-0.9 UT/100ML-% IV SOLN
INTRAVENOUS | Status: AC
  Administered 2019-07-30: 1.5 [IU]/h
  Filled 2019-07-30: qty 100

## 2019-07-30 MED ORDER — DEXTROSE-NACL 5-0.45 % IV SOLN: INTRAVENOUS | Status: DC

## 2019-07-30 MED ORDER — POTASSIUM CHLORIDE 10 MEQ/100ML IV SOLN: 10.0000 meq | INTRAVENOUS | Status: AC

## 2019-07-30 MED ORDER — METOCLOPRAMIDE HCL 5 MG/ML IJ SOLN
5.0000 mg | Freq: Four times a day (QID) | INTRAMUSCULAR | Status: DC
  Administered 2019-07-30 – 2019-08-04 (×20): 5 mg via INTRAVENOUS
  Filled 2019-07-30 (×20): qty 2

## 2019-07-30 MED ORDER — CHLORHEXIDINE GLUCONATE CLOTH 2 % EX PADS
6.0000 | MEDICATED_PAD | Freq: Every day | CUTANEOUS | Status: DC
  Administered 2019-07-30 – 2019-08-03 (×4): 6 via TOPICAL

## 2019-07-30 MED ORDER — ENOXAPARIN SODIUM 80 MG/0.8ML ~~LOC~~ SOLN
70.0000 mg | SUBCUTANEOUS | Status: DC
  Administered 2019-07-30 – 2019-08-03 (×5): 70 mg via SUBCUTANEOUS
  Filled 2019-07-30: qty 0.7
  Filled 2019-07-30: qty 0.8
  Filled 2019-07-30 (×3): qty 0.7

## 2019-07-30 MED ORDER — POTASSIUM CHLORIDE 10 MEQ/100ML IV SOLN
10.0000 meq | INTRAVENOUS | Status: AC
  Administered 2019-07-30 – 2019-07-31 (×4): 10 meq via INTRAVENOUS
  Filled 2019-07-30 (×4): qty 100

## 2019-07-30 MED ORDER — SODIUM CHLORIDE 0.9 % IV BOLUS
1000.0000 mL | Freq: Once | INTRAVENOUS | Status: AC
  Administered 2019-07-30: 1000 mL via INTRAVENOUS

## 2019-07-30 MED ORDER — METOCLOPRAMIDE HCL 5 MG/ML IJ SOLN
10.0000 mg | Freq: Once | INTRAMUSCULAR | Status: AC
  Administered 2019-07-30: 10 mg via INTRAVENOUS
  Filled 2019-07-30: qty 2

## 2019-07-30 MED ORDER — ONDANSETRON HCL 4 MG/2ML IJ SOLN
4.0000 mg | Freq: Four times a day (QID) | INTRAMUSCULAR | Status: DC | PRN
  Administered 2019-07-30 – 2019-08-01 (×3): 4 mg via INTRAVENOUS
  Filled 2019-07-30 (×3): qty 2

## 2019-07-30 MED ORDER — FAMOTIDINE IN NACL 20-0.9 MG/50ML-% IV SOLN
20.0000 mg | Freq: Once | INTRAVENOUS | Status: DC
  Filled 2019-07-30: qty 50

## 2019-07-30 MED ORDER — INSULIN REGULAR(HUMAN) IN NACL 100-0.9 UT/100ML-% IV SOLN
INTRAVENOUS | Status: DC
  Administered 2019-07-31: 3.8 [IU]/h via INTRAVENOUS
  Filled 2019-07-30: qty 100

## 2019-07-30 MED ORDER — DEXTROSE 50 % IV SOLN: 0.0000 mL | INTRAVENOUS | Status: DC | PRN

## 2019-07-30 MED ORDER — PANTOPRAZOLE SODIUM 40 MG IV SOLR
40.0000 mg | INTRAVENOUS | Status: DC
  Administered 2019-07-30 – 2019-08-02 (×4): 40 mg via INTRAVENOUS
  Filled 2019-07-30: qty 40

## 2019-07-30 MED ORDER — MAGNESIUM SULFATE 2 GM/50ML IV SOLN
2.0000 g | Freq: Once | INTRAVENOUS | Status: AC
  Administered 2019-07-30: 2 g via INTRAVENOUS
  Filled 2019-07-30: qty 50

## 2019-07-30 MED ORDER — FENTANYL CITRATE (PF) 100 MCG/2ML IJ SOLN
12.5000 ug | Freq: Once | INTRAMUSCULAR | Status: AC
  Administered 2019-07-30: 12.5 ug via INTRAVENOUS
  Filled 2019-07-30: qty 2

## 2019-07-30 MED ORDER — ONDANSETRON HCL 4 MG/2ML IJ SOLN
4.0000 mg | Freq: Once | INTRAMUSCULAR | Status: AC
  Administered 2019-07-30: 4 mg via INTRAVENOUS
  Filled 2019-07-30: qty 2

## 2019-07-30 MED ORDER — LACTATED RINGERS IV BOLUS: 1000.0000 mL | INTRAVENOUS | Status: DC

## 2019-07-30 MED ORDER — SODIUM CHLORIDE 0.9 % IV SOLN: INTRAVENOUS | Status: DC

## 2019-07-30 NOTE — ED Triage Notes (Signed)
Abdominal pain and headache x 4 days. Not eating or sleeping. She is here today with heart rate of 168 and dyspnea. Vomiting.

## 2019-07-30 NOTE — Progress Notes (Signed)
Notified admissions team that patient has arrived to unit. Pending orders. Will continue to monitor.

## 2019-07-30 NOTE — ED Notes (Signed)
Pt care turned over to [facility]. Pt only has single IV at the time due to pt with poor venous access. RNs had multiple attempts at [place] IV placement without success previously. EDP is aware and deferred secondary access to receiving facility.

## 2019-07-30 NOTE — H&P (Addendum)
[facility] History and Physical  Anniebell Bedore TJL:[id number] DOB: 1960-11-12 DOA: 07/30/2019  Referring EDP: Jefm Bryant PCP: [facility]   Chief Complaint: Nausea/Vomiting  HPI: Rae Plotner is a 59 y.o. female with PMH of T2DM, GERD, gout, HLD and MDD who presented to [facility] with intractable nausea/vomiting and transferred to [facility] for DKA.  Patient reports waking up on Friday and feeling congested with a cough and a headache. Reports nausea/vomiting started shortly thereafter and she has been unable to keep anything down including any of her medicines. Denies sick contacts. Reports only minimal abdominal pain. Some reflux discomfort in her chest present with SOB. Reports less frequent urination. Nothing has made nausea/vomiting better or worse. Reports this is her first time with DKA. Denies dizziness, fever, chills, chest pain, diarrhea, constipation, dysuria, hematuria, hematochezia, melena, difficulty moving arms/legs, speech difficulty, confusion or any other complaints.  At outside ER: Tachycardic with mildly elevated BP's. Stable on room air. Afebrile.  CBC showed mild leukocytosis (13), elevated hgb from baseline and thrombocytosis which is likely from hemoconcentration. BMP showed glucose 737, bicarb 10, anion gap 30. PH is 7.4. UA has >80 ketones, no signs of UTI. Started on insulin drip. CXR negative. COVID is negative. CT abdomen is negative.  Review of Systems:  All other systems negative unless noted above in HPI.   Past Medical History:  Diagnosis Date  . Contusion of right knee 02/17/2017  . Diabetes mellitus   . Gout   . High cholesterol   . Hypertension   . Migraine    Past Surgical History:  Procedure Laterality Date  . ABDOMINAL HYSTERECTOMY    . CARPAL TUNNEL RELEASE    . CHOLECYSTECTOMY    . HAND SURGERY    . KNEE SURGERY    . TONSILLECTOMY     Social History:  reports that she has never smoked. She has never used smokeless tobacco. She  reports that she does not drink alcohol or use drugs.  Allergies  Allergen Reactions  . Peanut Oil Itching    No family history on file.  Family history of DM, HTN and HLD.  Prior to Admission medications   Medication Sig Start Date End Date Taking? Authorizing Provider  allopurinol (ZYLOPRIM) 300 MG tablet Take 300 mg by mouth daily.   Yes [provider]  Atorvastatin Calcium (LIPITOR PO) Take 10 mg by mouth daily.    Yes [provider]  colchicine 0.6 MG tablet Take 0.6 mg by mouth daily.     Yes [provider]  ACCU-CHEK AVIVA PLUS test strip 2 (two) times daily. test blood sugar 07/08/19   [provider]  citalopram (CELEXA) 40 MG tablet Take 40 mg by mouth daily. 06/27/19   [provider]  fluticasone (FLONASE) 50 MCG/ACT nasal spray Place 1 spray into both nostrils daily. 05/17/19   [provider]  HYDROcodone-acetaminophen (NORCO) 10-325 MG tablet Take 1 tablet by mouth 3 (three) times daily as needed. 07/20/19   [provider]  metFORMIN (GLUMETZA) 500 MG (MOD) 24 hr tablet Take 1,000 mg by mouth 2 (two) times daily with a meal.    [provider]  omeprazole (PRILOSEC) 40 MG capsule Take 40 mg by mouth daily.    [provider]  pioglitazone (ACTOS) 30 MG tablet Take 30 mg by mouth daily.    [provider]  saxagliptin HCl (ONGLYZA) 5 MG TABS tablet Take 5 mg by mouth daily.    [provider]  tiZANidine (  ZANAFLEX) 4 MG tablet Take 4 mg by mouth 3 (three) times daily. 07/17/19   [provider]  traMADol (ULTRAM) 50 MG tablet Take 1 tablet (50 mg total) by mouth every 6 (six) hours as needed for moderate pain. You may take 1-2 tablets every 6 hours as needed for pain. You may also take with Tylenol for additional pain control 07/24/15   Charlesetta Shanks, MD  TRULICITY 4.5 IR/6.7EL SOPN INJECT 0.5 ML UNDER THE SKIN WEEKLY AS DIRECTED 07/19/19   [provider]    Physical Exam: Vitals:   07/30/19 1600 07/30/19 1700 07/30/19 1820 07/30/19 1900  BP:  (!) 150/89  (!) 213/98  Pulse: (!) 139 (!) 144  (!) 142  Resp: (!) 26 (!) 21    Temp:   98.7 F (37.1 C)   TempSrc:   Oral   SpO2: 100% 98%  99%  Weight:      Height:        Wt Readings from Last 3 Encounters:  07/30/19 (!) 140.2 kg  03/03/17 130.6 kg  02/11/17 131 kg    . General:  Appears calm and comfortable. AAOx4. Obese.  . Eyes: EOMI, normal lids, irises & conjunctiva . ENT: grossly normal hearing, dry mucous membranes, cracked lips . Neck: normal ROM . Cardiovascular: Tachycardic with regular rhythm, no m/r/g. No LE edema. Marland Kitchen Respiratory: CTA bilaterally, no w/r/r. Normal respiratory effort. . Abdomen: soft, minimal generalized tenderness . Skin: no rash or induration seen on limited exam . Musculoskeletal: grossly normal tone BUE/BLE . Psychiatric: grossly normal mood and affect, speech fluent and appropriate . Neurologic: grossly non-focal.          Labs on Admission:  Basic Metabolic Panel: Recent Labs  Lab 07/30/19 1203 07/30/19 1210  NA 133* 135  K 4.3 4.3  CL 93*  --   CO2 10*  --   GLUCOSE 737*  --   BUN 15  --   CREATININE 1.65*  --   CALCIUM 9.4  --    Liver Function Tests: Recent Labs  Lab 07/30/19 1203  AST 48*  ALT 78*  ALKPHOS 219*  BILITOT 1.2  PROT 9.8*  ALBUMIN 4.0   No results for input(s): LIPASE, AMYLASE in the last 168 hours. No results for input(s): AMMONIA in the last 168 hours. CBC: Recent Labs  Lab 07/30/19 1203 07/30/19 1210  WBC 13.6*  --   NEUTROABS 11.7*  --   HGB 14.7 16.3*  HCT 44.4 48.0*  MCV 85.1  --   PLT 442*  --    Cardiac Enzymes: No results for input(s): CKTOTAL, CKMB, CKMBINDEX, TROPONINI in the last 168 hours.  BNP (last 3 results) No results for input(s): BNP in the last 8760 hours.  ProBNP (last 3 results) No results for input(s): PROBNP in the last 8760 hours.  CBG: Recent Labs  Lab  07/30/19 1534 07/30/19 1640 07/30/19 1720 07/30/19 1832 07/30/19 1912  GLUCAP 501* 498* 494* 445* 367*    Radiological Exams on Admission: CT Abdomen Pelvis Wo Contrast  Result Date: 07/30/2019 CLINICAL DATA:  Nausea and vomiting with abdominal pain for several days EXAM: CT ABDOMEN AND PELVIS WITHOUT CONTRAST TECHNIQUE: Multidetector CT imaging of the abdomen and pelvis was performed following the standard protocol without IV contrast. COMPARISON:  02/11/2017 FINDINGS: Lower chest: No acute abnormality. Hepatobiliary: No focal liver abnormality is seen. Status post cholecystectomy. No biliary dilatation. Pancreas: Unremarkable. No pancreatic ductal dilatation or surrounding inflammatory changes. Spleen: Normal in size without  focal abnormality. Adrenals/Urinary Tract: Adrenal glands are within normal limits. Kidneys are well visualized without renal calculi or obstructive change. Left renal cyst is noted stable from the prior exam. Bladder is within normal limits. Stomach/Bowel: Appendix has been surgically removed. Diverticular change is noted without evidence of diverticulitis. Stomach is distended with fluid. Small bowel is unremarkable. Vascular/Lymphatic: Aortic atherosclerosis. No enlarged abdominal or pelvic lymph nodes. Reproductive: Status post hysterectomy. No adnexal masses. Other: No abdominal wall hernia or abnormality. No abdominopelvic ascites. Musculoskeletal: Degenerative change without acute abnormality. IMPRESSION: Diverticulosis without diverticulitis. No acute abnormality is noted. Electronically Signed   By: Alcide Clever M.D.   On: 07/30/2019 16:05   DG Chest Portable 1 View  Result Date: 07/30/2019 CLINICAL DATA:  Chest pain, shortness of breath. EXAM: PORTABLE CHEST 1 VIEW COMPARISON:  September 21, 2014. FINDINGS: The heart size and mediastinal contours are within normal limits. Both lungs are clear. No pneumothorax or pleural effusion is noted. The visualized skeletal structures  are unremarkable. IMPRESSION: No active disease. Electronically Signed   By: Lupita Raider M.D.   On: 07/30/2019 12:09    EKG: Independently reviewed. HR 145. Sinus tachycardia. QTc 415. No STEMI.  Assessment/Plan Principal Problem:   DKA (diabetic ketoacidoses) (HCC) Active Problems:   Obesity, Class III, BMI 40-49.9 (morbid obesity) (HCC)   Dyslipidemia   GERD (gastroesophageal reflux disease)   Gout   MDD (major depressive disorder)   AKI (acute kidney injury) (HCC)   Intractable nausea and vomiting  59 y.o. female with PMH of T2DM, GERD, gout, HLD and MDD who presented to [facility] with intractable nausea/vomiting and transferred to [facility] for DKA.  DKA T2DM - 5 days nausea/vomiting and unable to keep any PO down including medications - presented to [facility] with glucose 737, bicarb 10, AG 30, beta-hydroxybutyrate >8, pH 7.48 - likely viral in etiology; COVID negative/CXR negative/UA negative, afebrile and symptoms now mainly nausea/vomiting - cont insulin drip, last glucose 445 - BMP q2h; closely watch Na, K and bicarb (changed to q4h after first BMP after admission with improvement) - NPO - CT Abd/Pelv negative at [facility] - Hold home DM meds until off drip and tolerating PO: Metformin, Saxagliptin, Trulicity , Actos  - Zofran and Reglan for nausea/vomiting (QTc WNL) - Cont Tele due to tachycardia   AKI - Cr 1.65 on admission; likely pre-renal in setting of severe dehydration from nausea/vomiting and poor PO - Baseline appears to be ~ 0.80 - cont IVF - repeat BMP - urine lytes if not improving   GERD - IV PPI  HLD - Restart home Lipitor when tolerating PO  GERD - Restart home Allopurinol and Colchicine on discharge or when tolerating PO  MDD - Restart Celexa when tolerating PO  Chronic Pain - IV Dilaudid while NPO  - Restart home Zanaflex and Norco when tolerating PO  Code Status: Full DVT Prophylaxis: Lovenox Family Communication: None Disposition Plan: Admit to  Stepdown. Patient is at high risk for further decompensation due to age and co-morbidities. Anticipate discharge home in 3-5 days.    Time spent: 70 minutes  Joselyn Arrow, MD [facility] Pager [phone]

## 2019-07-30 NOTE — ED Notes (Signed)
Pt on monitor 

## 2019-07-30 NOTE — ED Notes (Signed)
IV attempt x3 unsuccessful, RN aware.

## 2019-07-30 NOTE — ED Provider Notes (Addendum)
[facility] EMERGENCY DEPARTMENT Provider Note   CSN: [id number] Arrival date & time: 07/30/19  1115     History Chief Complaint  Patient presents with  . Emesis    Tracey Castillo is a 59 y.o. female with history of DM, GERD, gout who presents with N/V. She states symptoms started on Friday. She has had intractable N/V and not been able to keep anything down or take her meds. She reports associated headache, congestion, sore throat, and cough. She also is having R sided chest pain that is worse with vomiting that comes and goes since Friday and shortness of breath. She endorses mild epigastric abodminal pain. She denies diarrhea and has been urinating less. She denies fever, chills, body aches. She denies being around any sick contacts. She lives with her 3 children.  HPI     Past Medical History:  Diagnosis Date  . Contusion of right knee 02/17/2017  . Diabetes mellitus   . Gout   . High cholesterol   . Hypertension   . Migraine     Patient Active Problem List   Diagnosis Date Noted  . Contusion of right knee 02/17/2017  . Abdominal wall hematoma 02/11/2017    Past Surgical History:  Procedure Laterality Date  . ABDOMINAL HYSTERECTOMY    . CARPAL TUNNEL RELEASE    . CHOLECYSTECTOMY    . HAND SURGERY    . KNEE SURGERY    . TONSILLECTOMY       OB History   No obstetric history on file.     No family history on file.  Social History   Tobacco Use  . Smoking status: Never Smoker  . Smokeless tobacco: Never Used  Substance Use Topics  . Alcohol use: No  . Drug use: No    Home Medications Prior to Admission medications   Medication Sig Start Date End Date Taking? Authorizing Provider  allopurinol (ZYLOPRIM) 300 MG tablet Take 300 mg by mouth daily.   Yes [provider]  Atorvastatin Calcium (LIPITOR PO) Take 10 mg by mouth daily.    Yes [provider]  colchicine 0.6 MG tablet Take 0.6 mg by mouth daily.     Yes [provider]  ACCU-CHEK AVIVA PLUS test strip 2 (two) times daily. test blood sugar 07/08/19   [provider]  citalopram (CELEXA) 40 MG tablet Take 40 mg by mouth daily. 06/27/19   [provider]  fluticasone (FLONASE) 50 MCG/ACT nasal spray Place 1 spray into both nostrils daily. 05/17/19   [provider]  HYDROcodone-acetaminophen (NORCO) 10-325 MG tablet Take 1 tablet by mouth 3 (three) times daily as needed. 07/20/19   [provider]  metFORMIN (GLUMETZA) 500 MG (MOD) 24 hr tablet Take 1,000 mg by mouth 2 (two) times daily with a meal.    [provider]  omeprazole (PRILOSEC) 40 MG capsule Take 40 mg by mouth daily.    [provider]  pioglitazone (ACTOS) 30 MG tablet Take 30 mg by mouth daily.    [provider]  saxagliptin HCl (ONGLYZA) 5 MG TABS tablet Take 5 mg by mouth daily.    [provider]  tiZANidine (ZANAFLEX) 4 MG tablet Take 4 mg by mouth 3 (three) times daily. 07/17/19   [provider]  traMADol (ULTRAM) 50 MG tablet Take 1 tablet (50 mg total) by mouth every 6 (six) hours as needed for moderate pain. You may take 1-2 tablets every 6 hours as needed for  pain. You may also take with Tylenol for additional pain control 07/24/15   Charlesetta Shanks, MD  TRULICITY 4.5 XB/2.8UX SOPN INJECT 0.5 ML UNDER THE SKIN WEEKLY AS DIRECTED 07/19/19   [provider]    Allergies    Peanut oil  Review of Systems   Review of Systems  Constitutional: Negative for chills and fever.  HENT: Positive for congestion and sore throat.   Respiratory: Positive for cough and shortness of breath.   Cardiovascular: Positive for chest pain. Negative for palpitations and leg swelling.  Gastrointestinal: Positive for abdominal pain, nausea and vomiting. Negative for diarrhea.  Genitourinary: Negative for difficulty urinating, dysuria and flank pain.  Neurological: Positive for headaches.  All other systems reviewed  and are negative.   Physical Exam Updated Vital Signs BP (!) 160/109   Pulse (!) 160   Temp 98 F (36.7 C) (Oral)   Resp (!) 28   Ht 5\' 6"  (1.676 m)   Wt (!) 140.2 kg   SpO2 99%   BMI 49.87 kg/m   Physical Exam Vitals and nursing note reviewed.  Constitutional:      General: She is in acute distress.     Appearance: She is well-developed. She is obese. She is not ill-appearing.     Comments: Cooperative. Mild distress. Tachypneic  HENT:     Head: Normocephalic and atraumatic.  Eyes:     General: No scleral icterus.       Right eye: No discharge.        Left eye: No discharge.     Conjunctiva/sclera: Conjunctivae normal.     Pupils: Pupils are equal, round, and reactive to light.  Cardiovascular:     Rate and Rhythm: Tachycardia present.  Pulmonary:     Effort: Pulmonary effort is normal. No respiratory distress.     Breath sounds: Normal breath sounds.  Abdominal:     General: There is no distension.     Palpations: Abdomen is soft.     Tenderness: There is abdominal tenderness (epigastric).  Musculoskeletal:     Cervical back: Normal range of motion.     Right lower leg: No edema.     Left lower leg: No edema.  Skin:    General: Skin is warm and dry.  Neurological:     Mental Status: She is alert and oriented to person, place, and time.  Psychiatric:        Behavior: Behavior normal.     ED Results / Procedures / Treatments   Labs (all labs ordered are listed, but only abnormal results are displayed) Labs Reviewed  BASIC METABOLIC PANEL - Abnormal; Notable for the following components:      Result Value   Sodium 133 (*)    Chloride 93 (*)    CO2 10 (*)    Glucose, Bld 737 (*)    Creatinine, Ser 1.65 (*)    GFR calc non Af Amer 34 (*)    GFR calc Af Amer 40 (*)    Anion gap 30 (*)    All other components within normal limits  URINALYSIS, ROUTINE W REFLEX MICROSCOPIC - Abnormal; Notable for the following components:   Glucose, UA >=500 (*)     Bilirubin Urine SMALL (*)    Ketones, ur >80 (*)    All other components within normal limits  CBC WITH DIFFERENTIAL/PLATELET - Abnormal; Notable for the following components:   WBC 13.6 (*)    RBC 5.22 (*)    Platelets 442 (*)  Neutro Abs 11.7 (*)    All other components within normal limits  URINALYSIS, MICROSCOPIC (REFLEX) - Abnormal; Notable for the following components:   Bacteria, UA RARE (*)    All other components within normal limits  CBG MONITORING, ED - Abnormal; Notable for the following components:   Glucose-Capillary >600 (*)    All other components within normal limits  CBG MONITORING, ED - Abnormal; Notable for the following components:   Glucose-Capillary 583 (*)    All other components within normal limits  POCT I-STAT EG7 - Abnormal; Notable for the following components:   pH, Ven 7.481 (*)    pCO2, Ven 16.3 (*)    pO2, Ven 124.0 (*)    Bicarbonate 12.2 (*)    TCO2 13 (*)    Acid-base deficit 8.0 (*)    Calcium, Ion 1.06 (*)    HCT 48.0 (*)    Hemoglobin 16.3 (*)    All other components within normal limits  CBG MONITORING, ED - Abnormal; Notable for the following components:   Glucose-Capillary >600 (*)    All other components within normal limits  CBG MONITORING, ED - Abnormal; Notable for the following components:   Glucose-Capillary 588 (*)    All other components within normal limits  CBG MONITORING, ED - Abnormal; Notable for the following components:   Glucose-Capillary 552 (*)    All other components within normal limits  CBG MONITORING, ED - Abnormal; Notable for the following components:   Glucose-Capillary 570 (*)    All other components within normal limits  SARS CORONAVIRUS 2 BY RT PCR (HOSPITAL ORDER, PERFORMED IN [facility])  BETA-HYDROXYBUTYRIC ACID  BETA-HYDROXYBUTYRIC ACID  LACTATE DEHYDROGENASE  I-STAT VENOUS BLOOD GAS, ED    EKG EKG Interpretation  Date/Time:  Tuesday Jul 30 2019 11:36:37 EDT Ventricular Rate:    158 PR Interval:    QRS Duration: 74 QT Interval:  269 QTC Calculation: 437 R Axis:   55 Text Interpretation: Sinus tachycardia favored over atrial flutter Consider right atrial enlargement Repol abnrm suggests ischemia, inferior leads Minimal ST elevation, lateral leads Confirmed by Derwood Kaplan [phone]) on 07/30/2019 11:42:12 AM   Radiology DG Chest Portable 1 View  Result Date: 07/30/2019 CLINICAL DATA:  Chest pain, shortness of breath. EXAM: PORTABLE CHEST 1 VIEW COMPARISON:  September 21, 2014. FINDINGS: The heart size and mediastinal contours are within normal limits. Both lungs are clear. No pneumothorax or pleural effusion is noted. The visualized skeletal structures are unremarkable. IMPRESSION: No active disease. Electronically Signed   By: Lupita Raider M.D.   On: 07/30/2019 12:09    Procedures Procedures (including critical care time)  CRITICAL CARE Performed by: Bethel Born   Total critical care time: 35 minutes  Critical care time was exclusive of separately billable procedures and treating other patients.  Critical care was necessary to treat or prevent imminent or life-threatening deterioration.  Critical care was time spent personally by me on the following activities: development of treatment plan with patient and/or surrogate as well as nursing, discussions with consultants, evaluation of patient's response to treatment, examination of patient, obtaining history from patient or surrogate, ordering and performing treatments and interventions, ordering and review of laboratory studies, ordering and review of radiographic studies, pulse oximetry and re-evaluation of patient's condition.   Medications Ordered in ED Medications  insulin regular, human (MYXREDLIN) 100 units/ 100 mL infusion (has no administration in time range)  0.9 %  sodium chloride infusion (has no administration  in time range)  dextrose 5 %-0.45 % sodium chloride infusion (has no  administration in time range)  dextrose 50 % solution 0-50 mL (has no administration in time range)  potassium chloride 10 mEq in 100 mL IVPB (has no administration in time range)  famotidine (PEPCID) IVPB 20 mg premix (has no administration in time range)  ondansetron (ZOFRAN) injection 4 mg (4 mg Intravenous Given 07/30/19 1206)  Insulin Regular(Human) in NaCl (MYXREDLIN) 100-0.9 UT/100ML-% infusion (2.6 Units/hr  Rate/Dose Change 07/30/19 1536)  sodium chloride 0.9 % bolus 1,000 mL (0 mLs Intravenous Stopped 07/30/19 1301)  metoCLOPramide (REGLAN) injection 10 mg (10 mg Intravenous Given by Other 07/30/19 1342)  sodium chloride 0.9 % bolus 1,000 mL (0 mLs Intravenous Stopped 07/30/19 1539)    ED Course  I have reviewed the triage vital signs and the nursing notes.  Pertinent labs & imaging results that were available during my care of the patient were reviewed by me and considered in my medical decision making (see chart for details).  59 year old female presents with N/V for several days along with headache, cough, congestion. She is also having some intermittent R sided atypical chest pain which is worse with vomiting. EKG is sinus tachycardia. She appears very dehydrated on exam. She is mildly distressed and tachypneic. Abdomen is soft and minimally tender in the epigastric region. Will start fluids and antiemetics. Will obtain DKA labs. Suspect viral process.  CBC shows mild leukocytosis (13), elevated hgb from baseline and thrombocytosis which is likely from hemoconcentration. BMP shows glucose 737, bicarb 10, anion gap 30. PH is 7.4. UA has >80 ketones, no signs of UTI. Will start insulin drip and another fluid bolus. CXR is negative. Shared visit with Dr. Rhunette Croft - will add on LFT, lipase, CT abdomen/pelvis to r/o pancreatitis. COVID is negative.  CT abdomen is negative.  3:31 PM Discussed with Dr. Ronaldo Miyamoto with [facility] who [facility] pt in transfer  MDM Rules/Calculators/A&P                        Final Clinical Impression(s) / ED Diagnoses Final diagnoses:  Diabetic ketoacidosis without coma associated with type 2 diabetes mellitus (HCC)  Nausea and vomiting, intractability of vomiting not specified, unspecified vomiting type    Rx / DC Orders ED Discharge Orders    None       Bethel Born, PA-C 07/30/19 1544    Bethel Born, PA-C 07/30/19 1611    Derwood Kaplan, MD 08/01/19 [phone]

## 2019-07-31 ENCOUNTER — Inpatient Hospital Stay: Payer: Self-pay

## 2019-07-31 LAB — CBC
HCT: 40.3 % (ref 36.0–46.0)
Hemoglobin: 12.8 g/dL (ref 12.0–15.0)
MCH: 27.9 pg (ref 26.0–34.0)
MCHC: 31.8 g/dL (ref 30.0–36.0)
MCV: 88 fL (ref 80.0–100.0)
Platelets: 374 10*3/uL (ref 150–400)
RBC: 4.58 MIL/uL (ref 3.87–5.11)
RDW: 15 % (ref 11.5–15.5)
WBC: 13 10*3/uL — ABNORMAL HIGH (ref 4.0–10.5)
nRBC: 0 % (ref 0.0–0.2)

## 2019-07-31 LAB — GLUCOSE, CAPILLARY
Glucose-Capillary: 157 mg/dL — ABNORMAL HIGH (ref 70–99)
Glucose-Capillary: 182 mg/dL — ABNORMAL HIGH (ref 70–99)
Glucose-Capillary: 190 mg/dL — ABNORMAL HIGH (ref 70–99)
Glucose-Capillary: 190 mg/dL — ABNORMAL HIGH (ref 70–99)
Glucose-Capillary: 199 mg/dL — ABNORMAL HIGH (ref 70–99)
Glucose-Capillary: 200 mg/dL — ABNORMAL HIGH (ref 70–99)
Glucose-Capillary: 207 mg/dL — ABNORMAL HIGH (ref 70–99)
Glucose-Capillary: 209 mg/dL — ABNORMAL HIGH (ref 70–99)
Glucose-Capillary: 214 mg/dL — ABNORMAL HIGH (ref 70–99)
Glucose-Capillary: 216 mg/dL — ABNORMAL HIGH (ref 70–99)
Glucose-Capillary: 218 mg/dL — ABNORMAL HIGH (ref 70–99)
Glucose-Capillary: 221 mg/dL — ABNORMAL HIGH (ref 70–99)
Glucose-Capillary: 224 mg/dL — ABNORMAL HIGH (ref 70–99)
Glucose-Capillary: 239 mg/dL — ABNORMAL HIGH (ref 70–99)
Glucose-Capillary: 242 mg/dL — ABNORMAL HIGH (ref 70–99)

## 2019-07-31 LAB — BASIC METABOLIC PANEL
Anion gap: 14 (ref 5–15)
Anion gap: 10 (ref 5–15)
Anion gap: 10 (ref 5–15)
Anion gap: 12 (ref 5–15)
BUN: 13 mg/dL (ref 6–20)
BUN: 14 mg/dL (ref 6–20)
BUN: 13 mg/dL (ref 6–20)
BUN: 12 mg/dL (ref 6–20)
CO2: 20 mmol/L — ABNORMAL LOW (ref 22–32)
CO2: 24 mmol/L (ref 22–32)
CO2: 22 mmol/L (ref 22–32)
CO2: 20 mmol/L — ABNORMAL LOW (ref 22–32)
Calcium: 8.7 mg/dL — ABNORMAL LOW (ref 8.9–10.3)
Calcium: 8.5 mg/dL — ABNORMAL LOW (ref 8.9–10.3)
Calcium: 8.4 mg/dL — ABNORMAL LOW (ref 8.9–10.3)
Calcium: 8.4 mg/dL — ABNORMAL LOW (ref 8.9–10.3)
Chloride: 105 mmol/L (ref 98–111)
Chloride: 104 mmol/L (ref 98–111)
Chloride: 109 mmol/L (ref 98–111)
Chloride: 105 mmol/L (ref 98–111)
Creatinine, Ser: 1.13 mg/dL — ABNORMAL HIGH (ref 0.44–1.00)
Creatinine, Ser: 1.17 mg/dL — ABNORMAL HIGH (ref 0.44–1.00)
Creatinine, Ser: 1.09 mg/dL — ABNORMAL HIGH (ref 0.44–1.00)
Creatinine, Ser: 1.07 mg/dL — ABNORMAL HIGH (ref 0.44–1.00)
GFR calc Af Amer: >60 mL/min (ref >60)
GFR calc Af Amer: 60 mL/min — ABNORMAL LOW (ref >60)
GFR calc Af Amer: >60 mL/min (ref >60)
GFR calc Af Amer: >60 mL/min (ref >60)
GFR calc non Af Amer: 54 mL/min — ABNORMAL LOW (ref >60)
GFR calc non Af Amer: 52 mL/min — ABNORMAL LOW (ref >60)
GFR calc non Af Amer: 56 mL/min — ABNORMAL LOW (ref >60)
GFR calc non Af Amer: 58 mL/min — ABNORMAL LOW (ref >60)
Glucose, Bld: 238 mg/dL — ABNORMAL HIGH (ref 70–99)
Glucose, Bld: 209 mg/dL — ABNORMAL HIGH (ref 70–99)
Glucose, Bld: 188 mg/dL — ABNORMAL HIGH (ref 70–99)
Glucose, Bld: 230 mg/dL — ABNORMAL HIGH (ref 70–99)
Potassium: 3.5 mmol/L (ref 3.5–5.1)
Potassium: 3.4 mmol/L — ABNORMAL LOW (ref 3.5–5.1)
Potassium: 4 mmol/L (ref 3.5–5.1)
Potassium: 3.8 mmol/L (ref 3.5–5.1)
Sodium: 139 mmol/L (ref 135–145)
Sodium: 138 mmol/L (ref 135–145)
Sodium: 141 mmol/L (ref 135–145)
Sodium: 137 mmol/L (ref 135–145)

## 2019-07-31 LAB — BETA-HYDROXYBUTYRIC ACID: Beta-Hydroxybutyric Acid: 1.3 mmol/L — ABNORMAL HIGH (ref 0.05–0.27)

## 2019-07-31 LAB — HEPATIC FUNCTION PANEL
ALT: 57 U/L — ABNORMAL HIGH (ref 0–44)
AST: 38 U/L (ref 15–41)
Albumin: 3.4 g/dL — ABNORMAL LOW (ref 3.5–5.0)
Alkaline Phosphatase: 137 U/L — ABNORMAL HIGH (ref 38–126)
Bilirubin, Direct: <0.1 mg/dL (ref 0.0–0.2)
Total Bilirubin: 0.8 mg/dL (ref 0.3–1.2)
Total Protein: 8.3 g/dL — ABNORMAL HIGH (ref 6.5–8.1)

## 2019-07-31 LAB — TROPONIN I (HIGH SENSITIVITY): Troponin I (High Sensitivity): 22 ng/L — ABNORMAL HIGH (ref <18)

## 2019-07-31 LAB — HIV ANTIBODY (ROUTINE TESTING W REFLEX): HIV Screen 4th Generation wRfx: NONREACTIVE

## 2019-07-31 MED ORDER — INSULIN ASPART 100 UNIT/ML ~~LOC~~ SOLN
4.0000 [IU] | Freq: Three times a day (TID) | SUBCUTANEOUS | Status: DC
  Administered 2019-07-31 (×2): 4 [IU] via SUBCUTANEOUS

## 2019-07-31 MED ORDER — INSULIN DETEMIR 100 UNIT/ML ~~LOC~~ SOLN
12.0000 [IU] | Freq: Two times a day (BID) | SUBCUTANEOUS | Status: DC
  Administered 2019-07-31: 12 [IU] via SUBCUTANEOUS
  Filled 2019-07-31: qty 0.12

## 2019-07-31 MED ORDER — INSULIN DETEMIR 100 UNIT/ML ~~LOC~~ SOLN
12.0000 [IU] | Freq: Every day | SUBCUTANEOUS | Status: DC
  Filled 2019-07-31: qty 0.12

## 2019-07-31 MED ORDER — SODIUM CHLORIDE 0.9% FLUSH: 10.0000 mL | INTRAVENOUS | Status: DC | PRN

## 2019-07-31 MED ORDER — ACETAMINOPHEN 325 MG PO TABS
650.0000 mg | ORAL_TABLET | Freq: Four times a day (QID) | ORAL | Status: DC | PRN
  Administered 2019-07-31 – 2019-08-03 (×3): 650 mg via ORAL
  Filled 2019-07-31 (×3): qty 2

## 2019-07-31 MED ORDER — HYDRALAZINE HCL 20 MG/ML IJ SOLN
5.0000 mg | Freq: Four times a day (QID) | INTRAMUSCULAR | Status: DC | PRN
  Administered 2019-07-31 – 2019-08-02 (×5): 5 mg via INTRAVENOUS
  Filled 2019-07-31 (×6): qty 1

## 2019-07-31 MED ORDER — SODIUM CHLORIDE 0.9 % IV BOLUS
1000.0000 mL | Freq: Once | INTRAVENOUS | Status: AC
  Administered 2019-07-31: 1000 mL via INTRAVENOUS

## 2019-07-31 MED ORDER — ALUM & MAG HYDROXIDE-SIMETH 200-200-20 MG/5ML PO SUSP: 15.0000 mL | Freq: Four times a day (QID) | ORAL | Status: DC | PRN

## 2019-07-31 MED ORDER — LIVING WELL WITH DIABETES BOOK
Freq: Once | Status: AC
  Filled 2019-07-31: qty 1

## 2019-07-31 MED ORDER — METOPROLOL TARTRATE 5 MG/5ML IV SOLN
5.0000 mg | INTRAVENOUS | Status: AC | PRN
  Administered 2019-07-31: 5 mg via INTRAVENOUS
  Filled 2019-07-31: qty 5

## 2019-07-31 MED ORDER — CARVEDILOL 3.125 MG PO TABS
3.1250 mg | ORAL_TABLET | Freq: Two times a day (BID) | ORAL | Status: DC
  Administered 2019-07-31 (×2): 3.125 mg via ORAL
  Filled 2019-07-31 (×2): qty 1

## 2019-07-31 MED ORDER — POTASSIUM CHLORIDE CRYS ER 20 MEQ PO TBCR
20.0000 meq | EXTENDED_RELEASE_TABLET | Freq: Once | ORAL | Status: AC
  Administered 2019-07-31: 20 meq via ORAL
  Filled 2019-07-31: qty 1

## 2019-07-31 MED ORDER — INSULIN ASPART 100 UNIT/ML ~~LOC~~ SOLN
0.0000 [IU] | Freq: Three times a day (TID) | SUBCUTANEOUS | Status: DC
  Administered 2019-07-31: 3 [IU] via SUBCUTANEOUS
  Administered 2019-07-31 – 2019-08-01 (×2): 5 [IU] via SUBCUTANEOUS
  Administered 2019-08-01: 3 [IU] via SUBCUTANEOUS
  Administered 2019-08-01: 5 [IU] via SUBCUTANEOUS
  Administered 2019-08-02 – 2019-08-03 (×4): 3 [IU] via SUBCUTANEOUS
  Administered 2019-08-03 (×2): 5 [IU] via SUBCUTANEOUS
  Administered 2019-08-04 (×2): 2 [IU] via SUBCUTANEOUS

## 2019-07-31 MED ORDER — SODIUM CHLORIDE 0.9% FLUSH
10.0000 mL | Freq: Two times a day (BID) | INTRAVENOUS | Status: DC
  Administered 2019-07-31 – 2019-08-03 (×7): 10 mL

## 2019-07-31 NOTE — Progress Notes (Signed)
PROGRESS NOTE  Tracey Castillo  XKG:[id number] DOB: [birth year] DOA: 07/30/2019 PCP: [facility]   Chef Complaints: Nausea and vomiting  Brief Narrative: 58 year old female with T2DM, GERD, COPD, HLD, MDD who presented to [facility] bid with 5 days of intractable nausea vomiting.  In the ED he was tachycardic, afebrile with mild leukocytosis and work-up significant for uncontrolled hyperglycemia blood sugar 737 bicarb 10 anion gap 30 pH 7.4 UA more than 80 ketones no signs of UTI chest x-ray and CT abdomen pelvis without contrast no acute finding.  Patient was found to be in DKA and was transferred to [facility].  Overnight and was negative.  Subjective:  C/o nausea but no vomitting.nausea improving. Overnight tachycardic, improving, blood pressure stable, saturating on room air. Afebrile.   Assessment & Plan:  DKA in the setting of T2DM: Anion gap has closed with insulin drip, IV fluids.  Sugar in 200s. No vomitting. Will try lantus ,premeal insulin and wean off insulin gtt 2 hr later.  We will continue to monitor her closely with CBG. Bmp reviewed in afternoon and gap remains closed and stable.  T2DM was taken off Metformin and was on Trulicity injection x 1 yr at home, with uncontrolled hyperglycemia.  Last A1c 9.1 in 2018, HbA1c pending.  Continue insulin as a above. Dm coordinator consult Recent Labs  Lab 07/31/19 [phone] 07/31/19 0712 07/31/19 0811 07/31/19 0820 07/31/19 0911  GLUCAP 190* 200* 199* 182* 207*   AKI 2/2 DKA/dehydration: appears tachycardic, and dehydrated will try bolus nss 1000 ml over 2 hrs, continue gentle IV hydration w/i NSS coen off insulin gtt. Recent Labs  Lab 07/30/19 1203 07/30/19 2011 07/30/19 2203 07/31/19 0242 07/31/19 0612  BUN 15 14 15 13 14   CREATININE 1.65* 1.20* 1.29* 1.13* 1.17*   Intractable nausea and vomiting:ct abd negative.  Lipase normal, LFTs was slightly abnormal, repeat LFTs. Cont reglan and ppi.  Unclear etiology. Continue  supportive measures, ivf.  Mildly positive troponin 40s, likely due to severe metabolic derangement with DKA/ She c/o some chest discomfort today and is on in the epigastric area and lower sternum and reproducible with palpation. She is also tachycardic repeat EKG and appears similar to yesterday's.Continue Protonix and Maalox. Will obtain cardio eval.   Headache- has chronic headache at home and sometime gets migraine attacks at home and gets  "Cocktail' in ED when flares up. Add tylenol.  Hypertension, uncontrolled: blood pressure has been uncontrolled since admission.  Also tachycardic.  Reportedly she was taken off her antihypertensive by her PCP.  Given persistent hypertension will start Coreg low-dose and uptitrate slowly.  Hypokalemia-repleted  Obesity, Class III, BMI 49: Will benefit with weight loss and healthy lifestyle.  Dyslipidemia: On Lipitor  GERD:cont ppi  Gout: On allopurinol.resume  MDD -cont home meds  DVT prophylaxis:lovenox Code Status:Full  Family Communication: plan of care discussed with patient at bedside. Patient was seen again this afternoon and reevaluated. Tolerating some oral intake, will keep her on high fluid hydration, monitor closely.  Status is: Inpatient Remains inpatient appropriate because:Persistent severe electrolyte disturbances and IV treatments appropriate due to intensity of illness or inability to take PO  Dispo: The patient is from: Home              Anticipated d/c is to: Home              Anticipated d/c date is: 2 days              Patient currently is not medically  stable to d/c. Nutrition: Diet Order            Diet Carb Modified Fluid consistency: Thin; Room service appropriate? Yes  Diet effective now               Body mass index is 49.87 kg/m.  Consultants:see note  Procedures:see note Microbiology:see note  Medications: Scheduled Meds: . Chlorhexidine Gluconate Cloth  6 each Topical Daily  . enoxaparin  (LOVENOX) injection  70 mg Subcutaneous Q24H  . insulin aspart  0-15 Units Subcutaneous TID WC  . insulin aspart  4 Units Subcutaneous TID WC  . insulin detemir  12 Units Subcutaneous BID  . metoCLOPramide (REGLAN) injection  5 mg Intravenous Q6H  . pantoprazole (PROTONIX) IV  40 mg Intravenous Q24H  . potassium chloride  20 mEq Oral Once   Continuous Infusions: . sodium chloride Stopped (07/31/19 0230)  . dextrose 5 % and 0.45% NaCl 75 mL/hr at 07/31/19 0700  . famotidine (PEPCID) IV    . insulin 3 Units/hr (07/31/19 1194)    Antimicrobials: Anti-infectives (From admission, onward)   None       Objective: Vitals: Today's Vitals   07/31/19 0500 07/31/19 0625 07/31/19 0700 07/31/19 0800  BP: (!) 159/79 (!) 160/84 (!) 151/91 (!) 171/87  Pulse: (!) 117 (!) 121 (!) 120 (!) 117  Resp:      Temp:    98.1 F (36.7 C)  TempSrc:    Oral  SpO2: 90% 94% 94% 94%  Weight:      Height:      PainSc:    0-No pain    Intake/Output Summary (Last 24 hours) at 07/31/2019 0934 Last data filed at 07/31/2019 0700 Gross per 24 hour  Intake 2374.77 ml  Output 500 ml  Net 1874.77 ml   Filed Weights   07/30/19 1132  Weight: (!) 140.2 kg   Weight change:    Intake/Output from previous day: 05/18 0701 - 05/19 0700 In: 2374.8 [I.V.:1215.2; IV Piggyback:1159.6] Out: 500 [Urine:500] Intake/Output this shift: No intake/output data recorded.  Examination:  General exam: AAOx2, anxious, on RA, obese HEENT:Oral mucosa moist, Ear/Nose WNL grossly,dentition normal. Respiratory system: bilaterally clear,no wheezing or crackles,no use of accessory muscle, non tender. Cardiovascular system: S1 & S2 +, regular, No JVD. Gastrointestinal system: Abdomen soft, mild tenderness generalized including epigastrium, Obese/ND, BS+. Nervous System:Alert, awake, moving extremities and grossly nonfocal Extremities:  Mild leg edema, distal peripheral pulses palpable.  Skin: No rashes,no icterus. MSK:  Normal muscle bulk,tone, power  Data Reviewed: I have personally reviewed following labs and imaging studies CBC: Recent Labs  Lab 07/30/19 1203 07/30/19 1210 07/31/19 0242  WBC 13.6*  --  13.0*  NEUTROABS 11.7*  --   --   HGB 14.7 16.3* 12.8  HCT 44.4 48.0* 40.3  MCV 85.1  --  88.0  PLT 442*  --  374   Basic Metabolic Panel: Recent Labs  Lab 07/30/19 1203 07/30/19 1203 07/30/19 1210 07/30/19 2011 07/30/19 2203 07/31/19 0242 07/31/19 0612  NA 133*   < > 135 137 141 139 138  K 4.3   < > 4.3 3.7 4.0 3.5 3.4*  CL 93*  --   --  103 105 105 104  CO2 10*  --   --  18* 22 20* 24  GLUCOSE 737*  --   --  392* 320* 238* 209*  BUN 15  --   --  14 15 13 14   CREATININE 1.65*  --   --  1.20* 1.29* 1.13* 1.17*  CALCIUM 9.4  --   --  8.6* 8.8* 8.7* 8.5*  MG  --   --   --  1.7  --   --   --    < > = values in this interval not displayed.   GFR: Estimated Creatinine Clearance: 76.8 mL/min (A) (by C-G formula based on SCr of 1.17 mg/dL (H)). Liver Function Tests: Recent Labs  Lab 07/30/19 1203 07/31/19 0612  AST 48* 38  ALT 78* 57*  ALKPHOS 219* 137*  BILITOT 1.2 0.8  PROT 9.8* 8.3*  ALBUMIN 4.0 3.4*   Recent Labs  Lab 07/30/19 2011  LIPASE 29   No results for input(s): AMMONIA in the last 168 hours. Coagulation Profile: No results for input(s): INR, PROTIME in the last 168 hours. Cardiac Enzymes: No results for input(s): CKTOTAL, CKMB, CKMBINDEX, TROPONINI in the last 168 hours. BNP (last 3 results) No results for input(s): PROBNP in the last 8760 hours. HbA1C: No results for input(s): HGBA1C in the last 72 hours. Lipid Profile: No results for input(s): CHOL, HDL, LDLCALC, TRIG, CHOLHDL, LDLDIRECT in the last 72 hours. Thyroid Function Tests: No results for input(s): TSH, T4TOTAL, FREET4, T3FREE, THYROIDAB in the last 72 hours. Anemia Panel: No results for input(s): VITAMINB12, FOLATE, FERRITIN, TIBC, IRON, RETICCTPCT in the last 72 hours. Sepsis Labs: No results  for input(s): PROCALCITON, LATICACIDVEN in the last 168 hours.  Recent Results (from the past 240 hour(s))  SARS Coronavirus 2 by RT PCR (hospital order, performed in [facility] hospital lab) Nasopharyngeal Nasopharyngeal Swab     Status: None   Collection Time: 07/30/19 12:18 PM   Specimen: Nasopharyngeal Swab  Result Value Ref Range Status   SARS Coronavirus 2 NEGATIVE NEGATIVE Final    Comment: (NOTE) SARS-CoV-2 target nucleic acids are NOT DETECTED. The SARS-CoV-2 RNA is generally detectable in upper and lower respiratory specimens during the acute phase of infection. The lowest concentration of SARS-CoV-2 viral copies this assay can detect is 250 copies / mL. A negative result does not preclude SARS-CoV-2 infection and should not be used as the sole basis for treatment or other patient management decisions.  A negative result may occur with improper specimen collection / handling, submission of specimen other than nasopharyngeal swab, presence of viral mutation(s) within the areas targeted by this assay, and inadequate number of viral copies (<250 copies / mL). A negative result must be combined with clinical observations, patient history, and epidemiological information. Fact Sheet for Patients:   BoilerBrush.com.cy Fact Sheet for Healthcare Providers: https://pope.com/ This test is not yet approved or cleared  by the [place] FDA and has been authorized for detection and/or diagnosis of SARS-CoV-2 by FDA under an Emergency Use Authorization (EUA).  This EUA will remain in effect (meaning this test can be used) for the duration of the COVID-19 declaration under Section 564(b)(1) of the Act, 21 U.S.C. section 360bbb-3(b)(1), unless the authorization is terminated or revoked sooner. Performed at [facility], [address] [place], [place] [address]   MRSA PCR Screening     Status: None   Collection Time:  07/30/19  6:16 PM   Specimen: Nasal Mucosa; Nasopharyngeal  Result Value Ref Range Status   MRSA by PCR NEGATIVE NEGATIVE Final    Comment:        The GeneXpert MRSA Assay (FDA approved for NASAL specimens only), is one component of a comprehensive MRSA colonization surveillance program. It is not intended to diagnose  MRSA infection nor to guide or monitor treatment for MRSA infections. Performed at [facility], [address] [place], [place] [address]       Radiology Studies: CT Abdomen Pelvis Wo Contrast  Result Date: 07/30/2019 CLINICAL DATA:  Nausea and vomiting with abdominal pain for several days EXAM: CT ABDOMEN AND PELVIS WITHOUT CONTRAST TECHNIQUE: Multidetector CT imaging of the abdomen and pelvis was performed following the standard protocol without IV contrast. COMPARISON:  02/11/2017 FINDINGS: Lower chest: No acute abnormality. Hepatobiliary: No focal liver abnormality is seen. Status post cholecystectomy. No biliary dilatation. Pancreas: Unremarkable. No pancreatic ductal dilatation or surrounding inflammatory changes. Spleen: Normal in size without focal abnormality. Adrenals/Urinary Tract: Adrenal glands are within normal limits. Kidneys are well visualized without renal calculi or obstructive change. Left renal cyst is noted stable from the prior exam. Bladder is within normal limits. Stomach/Bowel: Appendix has been surgically removed. Diverticular change is noted without evidence of diverticulitis. Stomach is distended with fluid. Small bowel is unremarkable. Vascular/Lymphatic: Aortic atherosclerosis. No enlarged abdominal or pelvic lymph nodes. Reproductive: Status post hysterectomy. No adnexal masses. Other: No abdominal wall hernia or abnormality. No abdominopelvic ascites. Musculoskeletal: Degenerative change without acute abnormality. IMPRESSION: Diverticulosis without diverticulitis. No acute abnormality is noted. Electronically Signed   By: Alcide Clever M.D.   On: 07/30/2019 16:05   DG Chest Portable 1 View  Result Date: 07/30/2019 CLINICAL DATA:  Chest pain, shortness of breath. EXAM: PORTABLE CHEST 1 VIEW COMPARISON:  September 21, 2014. FINDINGS: The heart size and mediastinal contours are within normal limits. Both lungs are clear. No pneumothorax or pleural effusion is noted. The visualized skeletal structures are unremarkable. IMPRESSION: No active disease. Electronically Signed   By: Lupita Raider M.D.   On: 07/30/2019 12:09     LOS: 1 day   Lanae Boast, MD [facility]  07/31/2019, 9:34 AM

## 2019-07-31 NOTE — Progress Notes (Signed)
Peripherally Inserted Central Catheter Placement  The IV Nurse has discussed with the patient and/or persons authorized to consent for the patient, the purpose of this procedure and the potential benefits and risks involved with this procedure.  The benefits include less needle sticks, lab draws from the catheter, and the patient may be discharged home with the catheter. Risks include, but not limited to, infection, bleeding, blood clot (thrombus formation), and puncture of an artery; nerve damage and irregular heartbeat and possibility to perform a PICC exchange if needed/ordered by physician.  Alternatives to this procedure were also discussed.  Bard Power PICC patient education guide, fact sheet on infection prevention and patient information card has been provided to patient /or left at bedside.    PICC Placement Documentation  PICC Double Lumen 07/31/19 PICC Right Brachial 46 cm 0 cm (Active)  Indication for Insertion or Continuance of Line Poor Vasculature-patient has had multiple peripheral attempts or PIVs lasting less than 24 hours 07/31/19 1950  Exposed Catheter (cm) 0 cm 07/31/19 1950  Site Assessment Clean;Dry;Intact 07/31/19 1950  Lumen #1 Status Blood return noted;Flushed;Saline locked 07/31/19 1950  Lumen #2 Status Blood return noted;Flushed;Saline locked 07/31/19 1950  Dressing Type Transparent;Occlusive;Securing device 07/31/19 1950  Dressing Status Clean;Dry;Intact;Antimicrobial disc in place 07/31/19 1950  Line Adjustment (NICU/IV Team Only) No 07/31/19 1950  Dressing Intervention New dressing 07/31/19 1950  Dressing Change Due 08/07/19 07/31/19 1950       Christeen Douglas 07/31/2019, 8:14 PM

## 2019-07-31 NOTE — TOC Initial Note (Signed)
Transition of Care [facility]) - Initial/Assessment Note    Patient Details  Name: Tracey Castillo MRN: [id number] Date of Birth: [birth year]  [facility] of [facility] CM/SW Contact:    Golda Acre, RN Phone Number: 07/31/2019, 10:21 AM  Clinical Narrative:                 dka glucose ranging 207-190 this am, iv insulin drip ongoing,iv pepcid.  Expected Discharge Plan: Home/Self Care Barriers to Discharge: Continued Medical Work up   Patient Goals and CMS Choice Patient states their goals for this hospitalization and ongoing recovery are:: to go home CMS Medicare.gov Compare Post Acute Care list provided to:: Patient    Expected Discharge Plan and Services Expected Discharge Plan: Home/Self Care   Discharge Planning Services: CM Consult   Living arrangements for the past 2 months: Single Family Home                                      Prior Living Arrangements/Services Living arrangements for the past 2 months: Single Family Home Lives with:: Spouse Patient language and need for interpreter reviewed:: No Do you feel safe going back to the place where you live?: Yes      Need for Family Participation in Patient Care: Yes (Comment) Care giver support system in place?: Yes (comment)   Criminal Activity/Legal Involvement Pertinent to Current Situation/Hospitalization: No - Comment as needed  Activities of Daily Living Home Assistive Devices/Equipment: None ADL Screening (condition at time of admission) Patient's cognitive ability adequate to safely complete daily activities?: Yes Is the patient deaf or have difficulty hearing?: No Does the patient have difficulty seeing, even when wearing glasses/contacts?: No Does the patient have difficulty concentrating, remembering, or making decisions?: No Patient able to express need for assistance with ADLs?: Yes Does the patient have difficulty dressing or bathing?: No Independently performs ADLs?: Yes (appropriate  for developmental age) Does the patient have difficulty walking or climbing stairs?: No Weakness of Legs: None Weakness of Arms/Hands: None  Permission Sought/Granted                  Emotional Assessment Appearance:: Appears stated age     Orientation: : Oriented to Place, Oriented to Self, Oriented to  Time, Oriented to Situation Alcohol / Substance Use: Not Applicable Psych Involvement: No (comment)  Admission diagnosis:  DKA (diabetic ketoacidoses) (HCC) [E11.10] Diabetic ketoacidosis without coma associated with type 2 diabetes mellitus (HCC) [E11.10] Nausea and vomiting, intractability of vomiting not specified, unspecified vomiting type [R11.2] Patient Active Problem List   Diagnosis Date Noted  . DKA (diabetic ketoacidoses) (HCC) 07/30/2019  . Obesity, Class III, BMI 40-49.9 (morbid obesity) (HCC) 07/30/2019  . Dyslipidemia 07/30/2019  . GERD (gastroesophageal reflux disease) 07/30/2019  . Gout 07/30/2019  . MDD (major depressive disorder) 07/30/2019  . AKI (acute kidney injury) (HCC) 07/30/2019  . Intractable nausea and vomiting 07/30/2019  . Contusion of right knee 02/17/2017  . Abdominal wall hematoma 02/11/2017   PCP:  [facility]:   [id number] - [place], [place] - [address] [place] [address] Phone: [phone] Fax: [phone]     Social Determinants of Health (SDOH) Interventions    Readmission Risk Interventions No flowsheet data found.

## 2019-07-31 NOTE — Progress Notes (Signed)
Notified MD of pt's SBP170's. . HR 110's-120's. New order received for bolus will continue to monitor.

## 2019-07-31 NOTE — Progress Notes (Signed)
Lab unable to get blood for troponin. 3 lab techs have stuck pt. MD made aware. Will continue to monitor.
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
## 2019-08-01 ENCOUNTER — Inpatient Hospital Stay (HOSPITAL_COMMUNITY): Payer: Medicaid Other

## 2019-08-01 ENCOUNTER — Other Ambulatory Visit: Payer: Self-pay

## 2019-08-01 DIAGNOSIS — I10 Essential (primary) hypertension: Secondary | ICD-10-CM

## 2019-08-01 DIAGNOSIS — R079 Chest pain, unspecified: Secondary | ICD-10-CM

## 2019-08-01 DIAGNOSIS — E785 Hyperlipidemia, unspecified: Secondary | ICD-10-CM

## 2019-08-01 DIAGNOSIS — I248 Other forms of acute ischemic heart disease: Secondary | ICD-10-CM

## 2019-08-01 DIAGNOSIS — N179 Acute kidney failure, unspecified: Secondary | ICD-10-CM

## 2019-08-01 DIAGNOSIS — E1110 Type 2 diabetes mellitus with ketoacidosis without coma: Principal | ICD-10-CM

## 2019-08-01 LAB — BASIC METABOLIC PANEL
Anion gap: 10 (ref 5–15)
BUN: 10 mg/dL (ref 6–20)
CO2: 20 mmol/L — ABNORMAL LOW (ref 22–32)
Calcium: 8.1 mg/dL — ABNORMAL LOW (ref 8.9–10.3)
Chloride: 105 mmol/L (ref 98–111)
Creatinine, Ser: 0.81 mg/dL (ref 0.44–1.00)
GFR calc Af Amer: >60 mL/min (ref >60)
GFR calc non Af Amer: >60 mL/min (ref >60)
Glucose, Bld: 247 mg/dL — ABNORMAL HIGH (ref 70–99)
Potassium: 4.3 mmol/L (ref 3.5–5.1)
Sodium: 135 mmol/L (ref 135–145)

## 2019-08-01 LAB — GLUCOSE, CAPILLARY
Glucose-Capillary: 249 mg/dL — ABNORMAL HIGH (ref 70–99)
Glucose-Capillary: 230 mg/dL — ABNORMAL HIGH (ref 70–99)
Glucose-Capillary: 199 mg/dL — ABNORMAL HIGH (ref 70–99)
Glucose-Capillary: 150 mg/dL — ABNORMAL HIGH (ref 70–99)

## 2019-08-01 LAB — HEMOGLOBIN A1C
Hgb A1c MFr Bld: 12.7 % — ABNORMAL HIGH (ref 4.8–5.6)
Mean Plasma Glucose: 318 mg/dL

## 2019-08-01 LAB — ECHOCARDIOGRAM COMPLETE
Height: 66 in
Weight: 4944 oz

## 2019-08-01 MED ORDER — METOPROLOL TARTRATE 5 MG/5ML IV SOLN
5.0000 mg | INTRAVENOUS | Status: AC | PRN
  Administered 2019-08-01: 5 mg via INTRAVENOUS
  Filled 2019-08-01: qty 5

## 2019-08-01 MED ORDER — PERFLUTREN LIPID MICROSPHERE
1.0000 mL | INTRAVENOUS | Status: AC | PRN
  Administered 2019-08-01: 2 mL via INTRAVENOUS
  Filled 2019-08-01: qty 10

## 2019-08-01 MED ORDER — ATORVASTATIN CALCIUM 40 MG PO TABS
80.0000 mg | ORAL_TABLET | Freq: Every day | ORAL | Status: DC
  Administered 2019-08-01 – 2019-08-04 (×4): 80 mg via ORAL
  Filled 2019-08-01 (×4): qty 2

## 2019-08-01 MED ORDER — LORATADINE 10 MG PO TABS
10.0000 mg | ORAL_TABLET | Freq: Every day | ORAL | Status: DC
  Administered 2019-08-01 – 2019-08-04 (×4): 10 mg via ORAL
  Filled 2019-08-01 (×4): qty 1

## 2019-08-01 MED ORDER — ASPIRIN EC 81 MG PO TBEC
81.0000 mg | DELAYED_RELEASE_TABLET | Freq: Every day | ORAL | Status: DC
  Administered 2019-08-01 – 2019-08-04 (×4): 81 mg via ORAL
  Filled 2019-08-01 (×4): qty 1

## 2019-08-01 MED ORDER — CARVEDILOL 6.25 MG PO TABS: 6.2500 mg | ORAL_TABLET | Freq: Two times a day (BID) | ORAL | Status: DC

## 2019-08-01 MED ORDER — STERILE WATER FOR INJECTION IJ SOLN
INTRAMUSCULAR | Status: AC
  Filled 2019-08-01: qty 20

## 2019-08-01 MED ORDER — INSULIN DETEMIR 100 UNIT/ML ~~LOC~~ SOLN
15.0000 [IU] | Freq: Every day | SUBCUTANEOUS | Status: DC
  Administered 2019-08-01: 15 [IU] via SUBCUTANEOUS
  Filled 2019-08-01 (×2): qty 0.15

## 2019-08-01 MED ORDER — SALINE SPRAY 0.65 % NA SOLN
1.0000 | NASAL | Status: DC | PRN
  Filled 2019-08-01: qty 44

## 2019-08-01 MED ORDER — METOPROLOL TARTRATE 5 MG/5ML IV SOLN
5.0000 mg | INTRAVENOUS | Status: AC | PRN
  Administered 2019-08-02: 5 mg via INTRAVENOUS
  Filled 2019-08-01: qty 5

## 2019-08-01 MED ORDER — INSULIN ASPART 100 UNIT/ML ~~LOC~~ SOLN
5.0000 [IU] | Freq: Three times a day (TID) | SUBCUTANEOUS | Status: DC
  Administered 2019-08-01 – 2019-08-04 (×9): 5 [IU] via SUBCUTANEOUS

## 2019-08-01 MED ORDER — CARVEDILOL 12.5 MG PO TABS
12.5000 mg | ORAL_TABLET | Freq: Two times a day (BID) | ORAL | Status: DC
  Administered 2019-08-01 – 2019-08-02 (×2): 12.5 mg via ORAL
  Filled 2019-08-01 (×2): qty 1

## 2019-08-01 MED ORDER — ALTEPLASE 2 MG IJ SOLR
2.0000 mg | Freq: Once | INTRAMUSCULAR | Status: AC
  Administered 2019-08-01: 2 mg

## 2019-08-01 MED ORDER — KETOROLAC TROMETHAMINE 30 MG/ML IJ SOLN
30.0000 mg | Freq: Once | INTRAMUSCULAR | Status: AC
  Administered 2019-08-01: 30 mg via INTRAVENOUS
  Filled 2019-08-01: qty 1

## 2019-08-01 MED ORDER — ALTEPLASE 2 MG IJ SOLR
2.0000 mg | Freq: Once | INTRAMUSCULAR | Status: AC
  Administered 2019-08-01: 2 mg
  Filled 2019-08-01: qty 2

## 2019-08-01 MED ORDER — OXYMETAZOLINE HCL 0.05 % NA SOLN
1.0000 | Freq: Two times a day (BID) | NASAL | Status: AC | PRN
  Filled 2019-08-01: qty 15

## 2019-08-01 MED ORDER — CARVEDILOL 6.25 MG PO TABS
6.2500 mg | ORAL_TABLET | Freq: Two times a day (BID) | ORAL | Status: DC
  Administered 2019-08-01: 6.25 mg via ORAL
  Filled 2019-08-01: qty 1

## 2019-08-01 NOTE — Progress Notes (Signed)
RUA DL PICC TPA'd @ 7681, TPA won't come out, unable to flushed both ports. RN made aware and will notify the MD. Will follow up.

## 2019-08-01 NOTE — Progress Notes (Signed)
Pt given prn IV hydralazine 5mg  for b/p 176/80. Will continue to monitor

## 2019-08-01 NOTE — Progress Notes (Addendum)
PROGRESS NOTE  Tracey Castillo  WUJ:[id number] DOB: 02/14/1961 DOA: 07/30/2019 PCP: [facility]   Chef Complaints: Nausea and vomiting  Brief Narrative: 59 year old female with T2DM, GERD, COPD, HLD, MDD who presented to [facility] bid with 5 days of intractable nausea vomiting.  In the ED he was tachycardic, afebrile with mild leukocytosis and work-up significant for uncontrolled hyperglycemia blood sugar 737 bicarb 10 anion gap 30 pH 7.4 UA more than 80 ketones no signs of UTI chest x-ray and CT abdomen pelvis without contrast no acute finding.  Patient was found to be in DKA and was transferred to [facility].  Subjective: Overall feeling okay. Mild chest pain in the midsternal area. Overnight patient had hydralazine, Dilaudid and Zofran. Blood pressure 160s to 190s, heart rate improved to 90s to 100   Assessment & Plan:  DKA in the setting of T2DM: With dietary indiscretion at home patient drinking lots of go and eat a lot of chocolate.  Extensively counseled, diabetic coordinator on board.  DKA closed and has been transitioned to Lantus Premeal insulin 5/19.  Continue to monitor and optimize.  T2DM, poorly controlled.  Hemoglobin A1c uncontrolled at 12.7. Continue insulin as a above. Dm coordinator consult.  Blood sugar poorly controlled, increase Lantus to 15 units and premeal insulin to 5 units TID, cont ssi. Recent Labs  Lab 07/31/19 1126 07/31/19 1646 07/31/19 2001 08/01/19 0755 08/01/19 1141  GLUCAP 157* 239* 216* 249* 230*   AKI due to DKA/dehydration, volume depletion: AKI resolved.  Continue gentle fluids.   Recent Labs  Lab 07/31/19 0242 07/31/19 0612 07/31/19 1027 07/31/19 1631 08/01/19 0622  BUN 13 14 13 12 10   CREATININE 1.13* 1.17* 1.09* 1.07* 0.81   Intractable nausea and vomiting:ct abd negative.  Lipase normal, LFTs was slightly abnormal, repeat LFTs. Cont reglan and ppi.Continue supportive measures, ivf.  Chest pain/mildly positive troponin 40s->20s,  positive troponin likely due to demand mismatch from severe metabolic derangement with DKA/ She c/o some chest discomfort, and also endorses exertional chest pain at home, given risk factors cardiology was consulted-and is planning for coronary CTA once stable from DKA and and heart rates are better controlled.  Increasing Coreg to 12.5 mg bid by cardio.Continue Protonix and Maalox.  Increasing Lipitor to 80 mg and start aspirin 81.  Headache-has chronic headache at home and sometime gets migraine attacks at home and gets  "Cocktail' in ED when flares up. Add tylenol.  Hypertension, uncontrolled: BP has been uncontrolled since admission.  Also tachycardic.  Reportedly she was taken off her antihypertensive by her PCP.  Given persistent hypertension-started on Coreg 3.125 mg 5/19, increased to 6.25. and further increased to 12.5 by cardio 5/20  Hypokalemia-improved.  Obesity with BMI 49: Will benefit with weight loss PCP follow-up negative study.    Dyslipidemia: On Lipitor  GERD:cont ppi  Gout: On allopurinol.resume  MDD -cont home meds  DVT prophylaxis:lovenox Code Status:Full  Family Communication: plan of care discussed with patient at bedside. Patient was seen again this afternoon and reevaluated.  Status is: Inpatient Remains inpatient appropriate because:Persistent severe electrolyte disturbances and IV treatments appropriate due to intensity of illness or inability to take PO  Dispo: The patient is from: Home              Anticipated d/c is to: Home              Anticipated d/c date is: 2 days              Patient  currently is not medically stable to d/c. Nutrition: Diet Order            Diet Carb Modified Fluid consistency: Thin; Room service appropriate? Yes  Diet effective now               Body mass index is 49.87 kg/m.  Consultants:see note  Procedures:see note Microbiology:see note  Medications: Scheduled Meds: . aspirin EC  81 mg Oral Daily  .  atorvastatin  80 mg Oral Daily  . carvedilol  12.5 mg Oral BID WC  . Chlorhexidine Gluconate Cloth  6 each Topical Daily  . enoxaparin (LOVENOX) injection  70 mg Subcutaneous Q24H  . insulin aspart  0-15 Units Subcutaneous TID WC  . insulin aspart  5 Units Subcutaneous TID WC  . insulin detemir  15 Units Subcutaneous Daily  . metoCLOPramide (REGLAN) injection  5 mg Intravenous Q6H  . pantoprazole (PROTONIX) IV  40 mg Intravenous Q24H  . sodium chloride flush  10-40 mL Intracatheter Q12H  . sterile water (preservative free)       Continuous Infusions: . sodium chloride 75 mL/hr at 08/01/19 1200  . dextrose 5 % and 0.45% NaCl Stopped (08/01/19 0417)  . famotidine (PEPCID) IV      Antimicrobials: Anti-infectives (From admission, onward)   None       Objective: Vitals: Today's Vitals   08/01/19 1000 08/01/19 1100 08/01/19 1155 08/01/19 1200  BP:   (!) 165/70 (!) 163/68  Pulse: 87 88 88 79  Resp:   20   Temp:      TempSrc:      SpO2: 98% 99% 100% 100%  Weight:      Height:      PainSc:   5      Intake/Output Summary (Last 24 hours) at 08/01/2019 1243 Last data filed at 08/01/2019 1200 Gross per 24 hour  Intake 1378.64 ml  Output 250 ml  Net 1128.64 ml   Filed Weights   07/30/19 1132  Weight: (!) 140.2 kg   Weight change:    Intake/Output from previous day: 05/19 0701 - 05/20 0700 In: 2430.5 [I.V.:1412.8; IV Piggyback:1017.7] Out: 250 [Urine:250] Intake/Output this shift: Total I/O In: 281.1 [I.V.:281.1] Out: -   Examination: General exam: AAOx3, weak looking, obese, on room air. HEENT:Oral mucosa moist, Ear/Nose WNL grossly, dentition normal. Respiratory system: bilaterally clear,no wheezing or crackles,no use of accessory muscle Cardiovascular system: S1 & S2 +, No JVD,. Gastrointestinal system: Abdomen soft, mildly Tender,ND, BS+ Nervous System:Alert, awake, moving extremities and grossly nonfocal Extremities: No edema, distal peripheral pulses  palpable.  Skin: No rashes,no icterus. MSK: Normal muscle bulk,tone, power  Data Reviewed: I have personally reviewed following labs and imaging studies CBC: Recent Labs  Lab 07/30/19 1203 07/30/19 1210 07/31/19 0242  WBC 13.6*  --  13.0*  NEUTROABS 11.7*  --   --   HGB 14.7 16.3* 12.8  HCT 44.4 48.0* 40.3  MCV 85.1  --  88.0  PLT 442*  --  374   Basic Metabolic Panel: Recent Labs  Lab 07/30/19 2011 07/30/19 2203 07/31/19 0242 07/31/19 0612 07/31/19 1027 07/31/19 1631 08/01/19 0622  NA 137   < > 139 138 141 137 135  K 3.7   < > 3.5 3.4* 4.0 3.8 4.3  CL 103   < > 105 104 109 105 105  CO2 18*   < > 20* 24 22 20* 20*  GLUCOSE 392*   < > 238* 209* 188* 230* 247*  BUN  14   < > 13 14 13 12 10   CREATININE 1.20*   < > 1.13* 1.17* 1.09* 1.07* 0.81  CALCIUM 8.6*   < > 8.7* 8.5* 8.4* 8.4* 8.1*  MG 1.7  --   --   --   --   --   --    < > = values in this interval not displayed.   GFR: Estimated Creatinine Clearance: 110.9 mL/min (by C-G formula based on SCr of 0.81 mg/dL). Liver Function Tests: Recent Labs  Lab 07/30/19 1203 07/31/19 0612  AST 48* 38  ALT 78* 57*  ALKPHOS 219* 137*  BILITOT 1.2 0.8  PROT 9.8* 8.3*  ALBUMIN 4.0 3.4*   Recent Labs  Lab 07/30/19 2011  LIPASE 29   No results for input(s): AMMONIA in the last 168 hours. Coagulation Profile: No results for input(s): INR, PROTIME in the last 168 hours. Cardiac Enzymes: No results for input(s): CKTOTAL, CKMB, CKMBINDEX, TROPONINI in the last 168 hours. BNP (last 3 results) No results for input(s): PROBNP in the last 8760 hours. HbA1C: Recent Labs    07/31/19 0612  HGBA1C 12.7*   Lipid Profile: No results for input(s): CHOL, HDL, LDLCALC, TRIG, CHOLHDL, LDLDIRECT in the last 72 hours. Thyroid Function Tests: No results for input(s): TSH, T4TOTAL, FREET4, T3FREE, THYROIDAB in the last 72 hours. Anemia Panel: No results for input(s): VITAMINB12, FOLATE, FERRITIN, TIBC, IRON, RETICCTPCT in the last  72 hours. Sepsis Labs: No results for input(s): PROCALCITON, LATICACIDVEN in the last 168 hours.  Recent Results (from the past 240 hour(s))  SARS Coronavirus 2 by RT PCR (hospital order, performed in [facility] hospital lab) Nasopharyngeal Nasopharyngeal Swab     Status: None   Collection Time: 07/30/19 12:18 PM   Specimen: Nasopharyngeal Swab  Result Value Ref Range Status   SARS Coronavirus 2 NEGATIVE NEGATIVE Final    Comment: (NOTE) SARS-CoV-2 target nucleic acids are NOT DETECTED. The SARS-CoV-2 RNA is generally detectable in upper and lower respiratory specimens during the acute phase of infection. The lowest concentration of SARS-CoV-2 viral copies this assay can detect is 250 copies / mL. A negative result does not preclude SARS-CoV-2 infection and should not be used as the sole basis for treatment or other patient management decisions.  A negative result may occur with improper specimen collection / handling, submission of specimen other than nasopharyngeal swab, presence of viral mutation(s) within the areas targeted by this assay, and inadequate number of viral copies (<250 copies / mL). A negative result must be combined with clinical observations, patient history, and epidemiological information. Fact Sheet for Patients:   StrictlyIdeas.no Fact Sheet for Healthcare Providers: BankingDealers.co.za This test is not yet approved or cleared  by the [place] FDA and has been authorized for detection and/or diagnosis of SARS-CoV-2 by FDA under an Emergency Use Authorization (EUA).  This EUA will remain in effect (meaning this test can be used) for the duration of the COVID-19 declaration under Section 564(b)(1) of the Act, 21 U.S.C. section 360bbb-3(b)(1), unless the authorization is terminated or revoked sooner. Performed at [facility], [address] [place], [place] [address]   MRSA PCR Screening      Status: None   Collection Time: 07/30/19  6:16 PM   Specimen: Nasal Mucosa; Nasopharyngeal  Result Value Ref Range Status   MRSA by PCR NEGATIVE NEGATIVE Final    Comment:        The GeneXpert MRSA Assay (FDA approved for NASAL specimens  only), is one component of a comprehensive MRSA colonization surveillance program. It is not intended to diagnose MRSA infection nor to guide or monitor treatment for MRSA infections. Performed at [facility], [address], [place], [place] [address]       Radiology Studies: CT Abdomen Pelvis Wo Contrast  Result Date: 07/30/2019 CLINICAL DATA:  Nausea and vomiting with abdominal pain for several days EXAM: CT ABDOMEN AND PELVIS WITHOUT CONTRAST TECHNIQUE: Multidetector CT imaging of the abdomen and pelvis was performed following the standard protocol without IV contrast. COMPARISON:  02/11/2017 FINDINGS: Lower chest: No acute abnormality. Hepatobiliary: No focal liver abnormality is seen. Status post cholecystectomy. No biliary dilatation. Pancreas: Unremarkable. No pancreatic ductal dilatation or surrounding inflammatory changes. Spleen: Normal in size without focal abnormality. Adrenals/Urinary Tract: Adrenal glands are within normal limits. Kidneys are well visualized without renal calculi or obstructive change. Left renal cyst is noted stable from the prior exam. Bladder is within normal limits. Stomach/Bowel: Appendix has been surgically removed. Diverticular change is noted without evidence of diverticulitis. Stomach is distended with fluid. Small bowel is unremarkable. Vascular/Lymphatic: Aortic atherosclerosis. No enlarged abdominal or pelvic lymph nodes. Reproductive: Status post hysterectomy. No adnexal masses. Other: No abdominal wall hernia or abnormality. No abdominopelvic ascites. Musculoskeletal: Degenerative change without acute abnormality. IMPRESSION: Diverticulosis without diverticulitis. No acute abnormality is noted.  Electronically Signed   By: Alcide Clever M.D.   On: 07/30/2019 16:05   [place] EKG SITE RITE  Result Date: 07/31/2019 If Site Rite image not attached, placement could not be confirmed due to current cardiac rhythm.    LOS: 2 days   Lanae Boast, MD [facility]  08/01/2019, 12:43 PM

## 2019-08-01 NOTE — Progress Notes (Signed)
MD notified that unable to flush or draw back on picc line even after cathflow. Order placed for IV team to remove picc

## 2019-08-01 NOTE — Consult Note (Addendum)
Cardiology Consultation:   Patient ID: Tracey Castillo MRN: [id number]; DOB: [birth year]  Admit date: 07/30/2019 Date of Consult: 08/01/2019  Primary Care Provider: [facility] [place] Primary Cardiologist: New patient Primary Electrophysiologist:  None    Patient Profile:   Tracey Castillo is a 59 y.o. female with a hx of type 2 diabetes, gout hypertension hyperlipidemia who is being seen today for the evaluation of elevated troponin at the request of Kc, Maren Beach, MD.  History of Present Illness:   Tracey Castillo is a very pleasant 59 year old female with history of insulin-dependent type 2 diabetes x30 years, gout, hypertension, hyperlipidemia, no prior cardiac history, who was admitted with DKA with severe nausea and vomiting.  She was found to have mildly elevated troponin in 40s and flat trend.  Upon questioning patient states that for last couple months she has been experiencing exertional dyspnea and chest pressure with moderate exertion such as housework.  It resolves with rest.  She does not have a cardiologist no prior history of heart disease.  She is generally very active.  Her electrolytes are normal, creatinine 0.81, hemoglobin 12.8, platelets 374.  Her baseline EKG looks normal. She was on atorvastatin 40 mg daily and Maxide prior to admission.  Past Medical History:  Diagnosis Date  . Contusion of right knee 02/17/2017  . Diabetes mellitus   . Gout   . High cholesterol   . Hypertension   . Migraine     Past Surgical History:  Procedure Laterality Date  . ABDOMINAL HYSTERECTOMY    . CARPAL TUNNEL RELEASE    . CHOLECYSTECTOMY    . HAND SURGERY    . KNEE SURGERY    . TONSILLECTOMY      Inpatient Medications: Scheduled Meds: . carvedilol  6.25 mg Oral BID WC  . Chlorhexidine Gluconate Cloth  6 each Topical Daily  . enoxaparin (LOVENOX) injection  70 mg Subcutaneous Q24H  . insulin aspart  0-15 Units Subcutaneous TID WC  . insulin aspart  5 Units  Subcutaneous TID WC  . insulin detemir  15 Units Subcutaneous Daily  . metoCLOPramide (REGLAN) injection  5 mg Intravenous Q6H  . pantoprazole (PROTONIX) IV  40 mg Intravenous Q24H  . sodium chloride flush  10-40 mL Intracatheter Q12H   Continuous Infusions: . sodium chloride 75 mL/hr at 08/01/19 1000  . dextrose 5 % and 0.45% NaCl Stopped (08/01/19 0417)  . famotidine (PEPCID) IV     PRN Meds: acetaminophen, alum & mag hydroxide-simeth, dextrose, hydrALAZINE, HYDROmorphone (DILAUDID) injection, metoprolol tartrate, ondansetron (ZOFRAN) IV, sodium chloride flush  Allergies:    Allergies  Allergen Reactions  . Peanut Oil Itching    Social History:   Social History   Socioeconomic History  . Marital status: Single    Spouse name: Not on file  . Number of children: Not on file  . Years of education: Not on file  . Highest education level: Not on file  Occupational History  . Not on file  Tobacco Use  . Smoking status: Never Smoker  . Smokeless tobacco: Never Used  Substance and Sexual Activity  . Alcohol use: No  . Drug use: No  . Sexual activity: Yes    Birth control/protection: Surgical  Other Topics Concern  . Not on file  Social History Narrative  . Not on file   Social Determinants of Health   Financial Resource Strain:   . Difficulty of Paying Living Expenses:   Food Insecurity:   . Worried About [employer] of  Food in the Last Year:   . Ran Out of Food in the Last Year:   Transportation Needs:   . Freight forwarder (Medical):   Marland Kitchen Lack of Transportation (Non-Medical):   Physical Activity:   . Days of Exercise per Week:   . Minutes of Exercise per Session:   Stress:   . Feeling of Stress :   Social Connections:   . Frequency of Communication with Friends and Family:   . Frequency of Social Gatherings with Friends and Family:   . Attends Religious Services:   . Active Member of Clubs or Organizations:   . Attends Banker Meetings:     Marland Kitchen Marital Status:   Intimate Partner Violence:   . Fear of Current or Ex-Partner:   . Emotionally Abused:   Marland Kitchen Physically Abused:   . Sexually Abused:     Family History:   No history of sudden cardiac death or MI in family.  ROS:  Please see the history of present illness.  All other ROS reviewed and negative.     Physical Exam/Data:   Vitals:   08/01/19 0700 08/01/19 0800 08/01/19 0900 08/01/19 1000  BP: (!) 163/77 (!) 157/66    Pulse: 95 93 99 87  Resp:      Temp:  97.9 F (36.6 C)    TempSrc:  Oral    SpO2: 92% 98% 100% 98%  Weight:      Height:        Intake/Output Summary (Last 24 hours) at 08/01/2019 1055 Last data filed at 08/01/2019 1000 Gross per 24 hour  Intake 2171.42 ml  Output 250 ml  Net 1921.42 ml   Last 3 Weights 07/30/2019 03/03/2017 02/11/2017  Weight (lbs) 309 lb 288 lb 288 lb 12.8 oz  Weight (kg) 140.161 kg 130.636 kg 131 kg     Body mass index is 49.87 kg/m.  General:  Well nourished, well developed, in no acute distress, somnolent, but answers questions HEENT: normal Lymph: no adenopathy Neck: no JVD Endocrine:  No thryomegaly Vascular: No carotid bruits; FA pulses 2+ bilaterally without bruits  Cardiac:  normal S1, S2; RRR; no murmur  Lungs:  clear to auscultation bilaterally, no wheezing, rhonchi or rales  Abd: soft, nontender, no hepatomegaly  Ext: no edema Musculoskeletal:  No deformities, BUE and BLE strength normal and equal Skin: warm and dry  Neuro:  CNs 2-12 intact, no focal abnormalities noted Psych:  Normal affect   EKG:  The EKG was personally reviewed and demonstrates: Sinus rhythm, normal EKG Telemetry:  Telemetry was personally reviewed and demonstrates: Sinus tachycardia.  Relevant CV Studies: Echo is pending  Laboratory Data:  High Sensitivity Troponin:   Recent Labs  Lab 07/30/19 2011 07/30/19 2203 07/31/19 1631  TROPONINIHS 36* 45* 22*     Chemistry Recent Labs  Lab 07/31/19 1027 07/31/19 1631  08/01/19 0622  NA 141 137 135  K 4.0 3.8 4.3  CL 109 105 105  CO2 22 20* 20*  GLUCOSE 188* 230* 247*  BUN 13 12 10   CREATININE 1.09* 1.07* 0.81  CALCIUM 8.4* 8.4* 8.1*  GFRNONAA 56* 58* >60  GFRAA >60 >60 >60  ANIONGAP 10 12 10     Recent Labs  Lab 07/30/19 1203 07/31/19 0612  PROT 9.8* 8.3*  ALBUMIN 4.0 3.4*  AST 48* 38  ALT 78* 57*  ALKPHOS 219* 137*  BILITOT 1.2 0.8   Hematology Recent Labs  Lab 07/30/19 1203 07/30/19 1210 07/31/19 0242  WBC 13.6*  --  13.0*  RBC 5.22*  --  4.58  HGB 14.7 16.3* 12.8  HCT 44.4 48.0* 40.3  MCV 85.1  --  88.0  MCH 28.2  --  27.9  MCHC 33.1  --  31.8  RDW 14.8  --  15.0  PLT 442*  --  374   BNPNo results for input(s): BNP, PROBNP in the last 168 hours.  DDimer No results for input(s): DDIMER in the last 168 hours.   Radiology/Studies:  CT Abdomen Pelvis Wo Contrast  Result Date: 07/30/2019 CLINICAL DATA:  Nausea and vomiting with abdominal pain for several days EXAM: CT ABDOMEN AND PELVIS WITHOUT CONTRAST TECHNIQUE: Multidetector CT imaging of the abdomen and pelvis was performed following the standard protocol without IV contrast. COMPARISON:  02/11/2017 FINDINGS: Lower chest: No acute abnormality. Hepatobiliary: No focal liver abnormality is seen. Status post cholecystectomy. No biliary dilatation. Pancreas: Unremarkable. No pancreatic ductal dilatation or surrounding inflammatory changes. Spleen: Normal in size without focal abnormality. Adrenals/Urinary Tract: Adrenal glands are within normal limits. Kidneys are well visualized without renal calculi or obstructive change. Left renal cyst is noted stable from the prior exam. Bladder is within normal limits. Stomach/Bowel: Appendix has been surgically removed. Diverticular change is noted without evidence of diverticulitis. Stomach is distended with fluid. Small bowel is unremarkable. Vascular/Lymphatic: Aortic atherosclerosis. No enlarged abdominal or pelvic lymph nodes. Reproductive:  Status post hysterectomy. No adnexal masses. Other: No abdominal wall hernia or abnormality. No abdominopelvic ascites. Musculoskeletal: Degenerative change without acute abnormality. IMPRESSION: Diverticulosis without diverticulitis. No acute abnormality is noted. Electronically Signed   By: Alcide Clever M.D.   On: 07/30/2019 16:05   DG Chest Portable 1 View  Result Date: 07/30/2019 CLINICAL DATA:  Chest pain, shortness of breath. EXAM: PORTABLE CHEST 1 VIEW COMPARISON:  September 21, 2014. FINDINGS: The heart size and mediastinal contours are within normal limits. Both lungs are clear. No pneumothorax or pleural effusion is noted. The visualized skeletal structures are unremarkable. IMPRESSION: No active disease. Electronically Signed   By: Lupita Raider M.D.   On: 07/30/2019 12:09   [place] EKG SITE RITE  Result Date: 07/31/2019 If Site Rite image not attached, placement could not be confirmed due to current cardiac rhythm.   Assessment and Plan:   DKA in the settings of type 2 diabetes Hypertension Hyperlipidemia Chest pain Elevated troponin  I believe that troponin elevation reflects demand ischemia in the settings of DKA and tachycardia.  Her troponin has a very flat trend 36->45->22.  However she admits that she has been experiencing exertional chest pain and dyspnea for the last couple of months, her risk factors are significant mostly poorly controlled diabetes, hypertension hyperlipidemia.  Her baseline EKG is normal.  Echocardiogram is pending.  I would plan for coronary CTA when she is stable from DKA and her heart rates are better controlled, her kidney function is normal, I would increase carvedilol to 12.5 mg p.o. twice daily did feel help with both of her blood pressure as well as rate control.  Increase atorvastatin to 80 mg daily, start aspirin 81 mg daily.  For questions or updates, please contact [facility] Please consult [email or web address] for contact info under    Signed, Tobias Alexander, MD  08/01/2019 10:55 AM

## 2019-08-01 NOTE — Progress Notes (Signed)
Consult received for RN question. Attempted return call with no answer. Will attempt again at later time.

## 2019-08-01 NOTE — Plan of Care (Signed)

## 2019-08-01 NOTE — Progress Notes (Signed)
Spoke with RN. Verified order received for PICC exchange. No other questions at this time.

## 2019-08-01 NOTE — Progress Notes (Signed)
Responded to consult for PICC line check. Unable to flush or pull back from both lumens; unable to TPA due to complete occlusion. Spoke with PICC RN who advised pulling line to 1 cm; this did not resolve occlusion. RN notified.

## 2019-08-01 NOTE — Progress Notes (Signed)
  Echocardiogram 2D Echocardiogram has been performed.  Tracey Castillo 08/01/2019, 1:54 PM

## 2019-08-02 DIAGNOSIS — R079 Chest pain, unspecified: Secondary | ICD-10-CM

## 2019-08-02 LAB — COMPREHENSIVE METABOLIC PANEL
ALT: 35 U/L (ref 0–44)
AST: 30 U/L (ref 15–41)
Albumin: 2.6 g/dL — ABNORMAL LOW (ref 3.5–5.0)
Alkaline Phosphatase: 113 U/L (ref 38–126)
Anion gap: 9 (ref 5–15)
BUN: 11 mg/dL (ref 6–20)
CO2: 19 mmol/L — ABNORMAL LOW (ref 22–32)
Calcium: 8.4 mg/dL — ABNORMAL LOW (ref 8.9–10.3)
Chloride: 109 mmol/L (ref 98–111)
Creatinine, Ser: 0.74 mg/dL (ref 0.44–1.00)
GFR calc Af Amer: >60 mL/min (ref >60)
GFR calc non Af Amer: >60 mL/min (ref >60)
Glucose, Bld: 198 mg/dL — ABNORMAL HIGH (ref 70–99)
Potassium: 4 mmol/L (ref 3.5–5.1)
Sodium: 137 mmol/L (ref 135–145)
Total Bilirubin: 0.7 mg/dL (ref 0.3–1.2)
Total Protein: 6.6 g/dL (ref 6.5–8.1)

## 2019-08-02 LAB — CBC
HCT: 34.5 % — ABNORMAL LOW (ref 36.0–46.0)
Hemoglobin: 11.1 g/dL — ABNORMAL LOW (ref 12.0–15.0)
MCH: 28.5 pg (ref 26.0–34.0)
MCHC: 32.2 g/dL (ref 30.0–36.0)
MCV: 88.5 fL (ref 80.0–100.0)
Platelets: 276 10*3/uL (ref 150–400)
RBC: 3.9 MIL/uL (ref 3.87–5.11)
RDW: 15.3 % (ref 11.5–15.5)
WBC: 6.5 10*3/uL (ref 4.0–10.5)
nRBC: 0 % (ref 0.0–0.2)

## 2019-08-02 LAB — GLUCOSE, CAPILLARY
Glucose-Capillary: 186 mg/dL — ABNORMAL HIGH (ref 70–99)
Glucose-Capillary: 193 mg/dL — ABNORMAL HIGH (ref 70–99)
Glucose-Capillary: 160 mg/dL — ABNORMAL HIGH (ref 70–99)
Glucose-Capillary: 185 mg/dL — ABNORMAL HIGH (ref 70–99)

## 2019-08-02 LAB — TROPONIN I (HIGH SENSITIVITY): Troponin I (High Sensitivity): 8 ng/L (ref <18)

## 2019-08-02 MED ORDER — LABETALOL HCL 5 MG/ML IV SOLN
5.0000 mg | INTRAVENOUS | Status: DC | PRN
  Administered 2019-08-02: 5 mg via INTRAVENOUS
  Filled 2019-08-02: qty 4

## 2019-08-02 MED ORDER — CARVEDILOL 12.5 MG PO TABS
12.5000 mg | ORAL_TABLET | Freq: Once | ORAL | Status: DC
  Filled 2019-08-02: qty 1

## 2019-08-02 MED ORDER — MORPHINE SULFATE (PF) 2 MG/ML IV SOLN
2.0000 mg | INTRAVENOUS | Status: DC | PRN
  Administered 2019-08-02 – 2019-08-04 (×10): 2 mg via INTRAVENOUS
  Filled 2019-08-02 (×10): qty 1

## 2019-08-02 MED ORDER — IRBESARTAN 75 MG PO TABS
75.0000 mg | ORAL_TABLET | Freq: Every day | ORAL | Status: DC
  Administered 2019-08-02: 75 mg via ORAL
  Filled 2019-08-02 (×2): qty 1

## 2019-08-02 MED ORDER — INSULIN DETEMIR 100 UNIT/ML ~~LOC~~ SOLN
20.0000 [IU] | Freq: Every day | SUBCUTANEOUS | Status: DC
  Administered 2019-08-02 – 2019-08-04 (×3): 20 [IU] via SUBCUTANEOUS
  Filled 2019-08-02 (×3): qty 0.2

## 2019-08-02 MED ORDER — CARVEDILOL 25 MG PO TABS
25.0000 mg | ORAL_TABLET | Freq: Two times a day (BID) | ORAL | Status: DC
  Administered 2019-08-02 – 2019-08-04 (×4): 25 mg via ORAL
  Filled 2019-08-02: qty 2
  Filled 2019-08-02 (×2): qty 1
  Filled 2019-08-02: qty 2

## 2019-08-02 NOTE — TOC Progression Note (Signed)
Transition of Care [facility]) - Progression Note    Patient Details  Name: Tracey Castillo MRN: [id number] Date of Birth: 11-26-1960  [facility] of [facility] CM/SW Contact  Golda Acre, RN Phone Number: 08/02/2019, 8:10 AM  Clinical Narrative:    Transitioned to sub q levemir, glucose 198 , anion gap 9, iv d51/2ns at 75cc/hr. Plan: diabetic teaching via the coordinator, home self care.  Expected Discharge Plan: Home/Self Care Barriers to Discharge: Continued Medical Work up  Expected Discharge Plan and Services Expected Discharge Plan: Home/Self Care   Discharge Planning Services: CM Consult   Living arrangements for the past 2 months: Single Family Home                                       Social Determinants of Health (SDOH) Interventions    Readmission Risk Interventions No flowsheet data found.

## 2019-08-02 NOTE — Progress Notes (Signed)
PROGRESS NOTE  Tracey Castillo  XHB:[id number] DOB: 1960-10-29 DOA: 07/30/2019 PCP: [facility]   Chef Complaints: Nausea and vomitting Brief Narrative: 59 year old female with T2DM, GERD, COPD, HLD, MDD who presented to [facility] bid with 5 days of intractable nausea vomiting.  In the ED he was tachycardic, afebrile with mild leukocytosis and work-up significant for uncontrolled hyperglycemia blood sugar 737 bicarb 10 anion gap 30 pH 7.4 UA more than 80 ketones no signs of UTI chest x-ray and CT abdomen pelvis without contrast no acute finding.  Patient was found to be in DKA and was transferred to [facility].  Subjective:  Feels well overall, no abdominal pain cough fever. sugar better' bp high in 200s this am just got her bp meds   Assessment & Plan:  DKA 2/2 dietary indiscretion in the setting of T2DM: patient drinking lots of go and eat a lot of chocolate.  Extensively counseled, diabetic coordinator on board.  DKA resolved, blood sugar improving continue current insulin as below.   Poorly controlled type 2 diabetes mellitus.Hemoglobin A1c uncontrolled at 12.7.  Increase Levemir to 20 units, continue premeal units 5 minutes 3 times daily and sliding scale.  Monitor sugar and adjust insulin.  Recent Labs  Lab 08/01/19 0755 08/01/19 1141 08/01/19 1612 08/01/19 2127 08/02/19 0743  GLUCAP 249* 230* 199* 150* 186*   AKI due to DKA/volume depletion dehydration : AKI resolved.  Stop IV fluids encourage oral intake.   Recent Labs  Lab 07/31/19 0612 07/31/19 1027 07/31/19 1631 08/01/19 0622 08/02/19 0439  BUN 14 13 12 10 11   CREATININE 1.17* 1.09* 1.07* 0.81 0.74   Intractable nausea and vomiting: CTt abd negative.  Lipase normal, LFTs was slightly abnormal, repeat LFTs has improved.Cont reglan and ppi.Continue supportive measures, ivf.  Chest pain with mildly positive troponin: 40s->20s, positive troponin likely demand mismatch from severe metabolic derangement with DKA.  She  has c/o chest discomfort, and also endorses exertional chest pain at home, given risk factors cardiology was consulted-and is planning for coronary CTA once stable from DKA and and heart rates are better controlled.  Increased Coreg to 12.5 mg bid 5/20-further increase to 25 mg twice daily per cardiology this morning.  Continue Lipitor 80 mg, aspirin 81 mg.  Cont ppi  Metabolic acidosis bicarb slightly low at 19.  Stop IV fluids encourage oral intake.  Headache-has chronic headache at home and sometime gets migraine attacks at home and gets  "Cocktail' in ED when flares up. Add tylenol.  Uncontrolled hypertension: Blood pressure very high in 200s this morning.  Coreg increased to 25 mg further per cardiology.  Add As needed labetalol.  Continue to monitor and adjust medication Hypokalemia-improved.  Obesity with BMI 49: Will benefit with weight loss PCP follow-up negative study.    Dyslipidemia: On Lipitor  GERD:cont ppi  Gout: On allopurinol.resume  MDD -cont home meds  DVT prophylaxis:lovenox Code Status:Full  Family Communication: plan of care discussed with patient at bedside.  Status is: Inpatient Remains inpatient appropriate because for ongoing need of insulin adjustment, for chest pain eval and pending CT Coronories once BP and HR is improved. Will need cardio clearance prior to d/c.  Dispo: The patient is from: Home              Anticipated d/c is to: Home              Anticipated d/c date is: 1 day  Patient currently is not medically stable to d/c. Nutrition: Diet Order            Diet Carb Modified Fluid consistency: Thin; Room service appropriate? Yes  Diet effective now               Body mass index is 49.87 kg/m.  Consultants:see note  Procedures:see note Microbiology:see note  Medications: Scheduled Meds: . aspirin EC  81 mg Oral Daily  . atorvastatin  80 mg Oral Daily  . carvedilol  12.5 mg Oral BID WC  . Chlorhexidine Gluconate Cloth  6  each Topical Daily  . enoxaparin (LOVENOX) injection  70 mg Subcutaneous Q24H  . insulin aspart  0-15 Units Subcutaneous TID WC  . insulin aspart  5 Units Subcutaneous TID WC  . insulin detemir  20 Units Subcutaneous Daily  . loratadine  10 mg Oral Daily  . metoCLOPramide (REGLAN) injection  5 mg Intravenous Q6H  . pantoprazole (PROTONIX) IV  40 mg Intravenous Q24H  . sodium chloride flush  10-40 mL Intracatheter Q12H   Continuous Infusions: . dextrose 5 % and 0.45% NaCl Stopped (08/01/19 0417)  . famotidine (PEPCID) IV      Antimicrobials: Anti-infectives (From admission, onward)   None       Objective: Vitals: Today's Vitals   08/02/19 0600 08/02/19 0645 08/02/19 0745 08/02/19 0800  BP: (!) 159/70   (!) 202/64  Pulse: 94   96  Resp:      Temp:    97.8 F (36.6 C)  TempSrc:    Oral  SpO2: 100%   99%  Weight:      Height:      PainSc:  8  3      Intake/Output Summary (Last 24 hours) at 08/02/2019 0818 Last data filed at 08/02/2019 0500 Gross per 24 hour  Intake 1788.02 ml  Output 900 ml  Net 888.02 ml   Filed Weights   07/30/19 1132  Weight: (!) 140.2 kg   Weight change:    Intake/Output from previous day: 05/20 0701 - 05/21 0700 In: 1788 [P.O.:240; I.V.:1548] Out: 900 [Urine:900] Intake/Output this shift: No intake/output data recorded.  Examination: General exam: AAOx3, sick looking, not in distress, weak appearing. HEENT:Oral mucosa moist, Ear/Nose WNL grossly, dentition normal. Respiratory system: bilaterally clear, no wheezing or crackles,no use of accessory muscle Cardiovascular system: S1 & S2 +, No JVD,. Gastrointestinal system: Abdomen soft, NT,ND, BS+ Nervous System:Alert, awake, moving extremities and grossly nonfocal Extremities: No edema, distal peripheral pulses palpable.  Skin: No rashes,no icterus. MSK: Normal muscle bulk,tone, power   Data Reviewed: I have personally reviewed following labs and imaging studies CBC: Recent Labs    Lab 07/30/19 1203 07/30/19 1210 07/31/19 0242 08/02/19 0439  WBC 13.6*  --  13.0* 6.5  NEUTROABS 11.7*  --   --   --   HGB 14.7 16.3* 12.8 11.1*  HCT 44.4 48.0* 40.3 34.5*  MCV 85.1  --  88.0 88.5  PLT 442*  --  374 276   Basic Metabolic Panel: Recent Labs  Lab 07/30/19 2011 07/30/19 2203 07/31/19 0612 07/31/19 1027 07/31/19 1631 08/01/19 0622 08/02/19 0439  NA 137   < > 138 141 137 135 137  K 3.7   < > 3.4* 4.0 3.8 4.3 4.0  CL 103   < > 104 109 105 105 109  CO2 18*   < > 24 22 20* 20* 19*  GLUCOSE 392*   < > 209* 188* 230* 247* 198*  BUN 14   < > 14 13 12 10 11   CREATININE 1.20*   < > 1.17* 1.09* 1.07* 0.81 0.74  CALCIUM 8.6*   < > 8.5* 8.4* 8.4* 8.1* 8.4*  MG 1.7  --   --   --   --   --   --    < > = values in this interval not displayed.   GFR: Estimated Creatinine Clearance: 112.3 mL/min (by C-G formula based on SCr of 0.74 mg/dL). Liver Function Tests: Recent Labs  Lab 07/30/19 1203 07/31/19 0612 08/02/19 0439  AST 48* 38 30  ALT 78* 57* 35  ALKPHOS 219* 137* 113  BILITOT 1.2 0.8 0.7  PROT 9.8* 8.3* 6.6  ALBUMIN 4.0 3.4* 2.6*   Recent Labs  Lab 07/30/19 2011  LIPASE 29   No results for input(s): AMMONIA in the last 168 hours. Coagulation Profile: No results for input(s): INR, PROTIME in the last 168 hours. Cardiac Enzymes: No results for input(s): CKTOTAL, CKMB, CKMBINDEX, TROPONINI in the last 168 hours. BNP (last 3 results) No results for input(s): PROBNP in the last 8760 hours. HbA1C: Recent Labs    07/31/19 0612  HGBA1C 12.7*   Lipid Profile: No results for input(s): CHOL, HDL, LDLCALC, TRIG, CHOLHDL, LDLDIRECT in the last 72 hours. Thyroid Function Tests: No results for input(s): TSH, T4TOTAL, FREET4, T3FREE, THYROIDAB in the last 72 hours. Anemia Panel: No results for input(s): VITAMINB12, FOLATE, FERRITIN, TIBC, IRON, RETICCTPCT in the last 72 hours. Sepsis Labs: No results for input(s): PROCALCITON, LATICACIDVEN in the last 168  hours.  Recent Results (from the past 240 hour(s))  SARS Coronavirus 2 by RT PCR (hospital order, performed in [facility] hospital lab) Nasopharyngeal Nasopharyngeal Swab     Status: None   Collection Time: 07/30/19 12:18 PM   Specimen: Nasopharyngeal Swab  Result Value Ref Range Status   SARS Coronavirus 2 NEGATIVE NEGATIVE Final    Comment: (NOTE) SARS-CoV-2 target nucleic acids are NOT DETECTED. The SARS-CoV-2 RNA is generally detectable in upper and lower respiratory specimens during the acute phase of infection. The lowest concentration of SARS-CoV-2 viral copies this assay can detect is 250 copies / mL. A negative result does not preclude SARS-CoV-2 infection and should not be used as the sole basis for treatment or other patient management decisions.  A negative result may occur with improper specimen collection / handling, submission of specimen other than nasopharyngeal swab, presence of viral mutation(s) within the areas targeted by this assay, and inadequate number of viral copies (<250 copies / mL). A negative result must be combined with clinical observations, patient history, and epidemiological information. Fact Sheet for Patients:   08/01/19 Fact Sheet for Healthcare Providers: BoilerBrush.com.cy This test is not yet approved or cleared  by the https://pope.com/ FDA and has been authorized for detection and/or diagnosis of SARS-CoV-2 by FDA under an Emergency Use Authorization (EUA).  This EUA will remain in effect (meaning this test can be used) for the duration of the COVID-19 declaration under Section 564(b)(1) of the Act, 21 U.S.C. section 360bbb-3(b)(1), unless the authorization is terminated or revoked sooner. Performed at [facility], [address] [place], [place]   MRSA PCR Screening     Status: None   Collection Time: 07/30/19  6:16 PM   Specimen: Nasal Mucosa; Nasopharyngeal    Result Value Ref Range Status   MRSA by PCR NEGATIVE NEGATIVE Final    Comment:  The GeneXpert MRSA Assay (FDA approved for NASAL specimens only), is one component of a comprehensive MRSA colonization surveillance program. It is not intended to diagnose MRSA infection nor to guide or monitor treatment for MRSA infections. Performed at [facility], [address] [place], [place] [address]       Radiology Studies: ECHOCARDIOGRAM COMPLETE  Result Date: 08/01/2019    ECHOCARDIOGRAM REPORT   Patient Name:   Tracey Castillo Date of Exam: 08/01/2019 Medical Rec #:  [id number]         Height:       66.0 in Accession #:    [id number]        Weight:       309.0 lb Date of Birth:  Dec 12, 1960         BSA:          2.406 m Patient Age:    58 years          BP:           163/68 mmHg Patient Gender: F                 HR:           79 bpm. Exam Location:  Inpatient Procedure: 2D Echo and Intracardiac Opacification Agent Indications:    Chest Pain 786.50 / R07.9  History:        Patient has no prior history of Echocardiogram examinations.                 Risk Factors:Dyslipidemia and Obesity. AKI (acute kidney                 injury).  Sonographer:    Leeroy Bock Turrentine Referring Phys: [id number] Lars Masson  Sonographer Comments: Suboptimal apical window and suboptimal subcostal window. Image acquisition challenging due to patient body habitus. IMPRESSIONS  1. Left ventricular ejection fraction, by estimation, is 70 to 75%. The left ventricle has hyperdynamic function, with midcavitary obstruction, maximum instantaneous gradient 29 mmHg (velocity 2.7 m/s) . The left ventricle has no regional wall motion abnormalities. Left ventricular diastolic parameters are consistent with Grade I diastolic dysfunction (impaired relaxation).  2. Right ventricular systolic function is normal. The right ventricular size is normal.  3. The mitral valve is normal in structure. No evidence of mitral  valve regurgitation. No evidence of mitral stenosis.  4. The aortic valve is tricuspid. Aortic valve regurgitation is not visualized. No aortic stenosis is present. Conclusion(s)/Recommendation(s): Normal coronary artery origins. FINDINGS  Left Ventricle: Left ventricular ejection fraction, by estimation, is 70 to 75%. The left ventricle has hyperdynamic function. The left ventricle has no regional wall motion abnormalities. Definity contrast agent was given IV to delineate the left ventricular endocardial borders. The left ventricular internal cavity size was normal in size. There is no left ventricular hypertrophy. Left ventricular diastolic parameters are consistent with Grade I diastolic dysfunction (impaired relaxation). Right Ventricle: The right ventricular size is normal. No increase in right ventricular wall thickness. Right ventricular systolic function is normal. Left Atrium: Left atrial size was normal in size. Right Atrium: Right atrial size was normal in size. Pericardium: There is no evidence of pericardial effusion. Mitral Valve: The mitral valve is normal in structure. Normal mobility of the mitral valve leaflets. No evidence of mitral valve regurgitation. No evidence of mitral valve stenosis. Tricuspid Valve: The tricuspid valve is normal in structure. Tricuspid valve regurgitation is trivial. No evidence of tricuspid stenosis. Aortic Valve: The aortic valve  is tricuspid. Aortic valve regurgitation is not visualized. No aortic stenosis is present. Aortic valve mean gradient measures 9.0 mmHg. Aortic valve peak gradient measures 15.4 mmHg. Aortic valve area, by VTI measures 1.67  cm. Pulmonic Valve: The pulmonic valve was normal in structure. Pulmonic valve regurgitation is trivial. No evidence of pulmonic stenosis. Aorta: The aortic root is normal in size and structure. Venous: The inferior vena cava was not well visualized. IAS/Shunts: The interatrial septum was not well visualized.  LEFT  VENTRICLE PLAX 2D LVIDd:         3.90 cm  Diastology LVIDs:         2.40 cm  LV e' lateral:   7.07 cm/s LV PW:         1.00 cm  LV E/e' lateral: 13.3 LV IVS:        1.00 cm  LV e' medial:    6.53 cm/s LVOT diam:     1.80 cm  LV E/e' medial:  14.4 LV SV:         56 LV SV Index:   23 LVOT Area:     2.54 cm  LEFT ATRIUM           Index       RIGHT ATRIUM           Index LA diam:      3.40 cm 1.41 cm/m  RA Area:     14.10 cm LA Vol (A4C): 34.1 ml 14.17 ml/m RA Volume:   37.90 ml  15.75 ml/m  AORTIC VALVE AV Area (Vmax):    1.42 cm AV Area (Vmean):   1.46 cm AV Area (VTI):     1.67 cm AV Vmax:           196.00 cm/s AV Vmean:          140.000 cm/s AV VTI:            0.338 m AV Peak Grad:      15.4 mmHg AV Mean Grad:      9.0 mmHg LVOT Vmax:         109.00 cm/s LVOT Vmean:        80.300 cm/s LVOT VTI:          0.222 m LVOT/AV VTI ratio: 0.66  AORTA Ao Root diam: 2.30 cm MITRAL VALVE MV Area (PHT): 3.74 cm     SHUNTS MV Decel Time: 203 msec     Systemic VTI:  0.22 m MV E velocity: 93.80 cm/s   Systemic Diam: 1.80 cm MV A velocity: 102.00 cm/s MV E/A ratio:  0.92 Weston Brass MD Electronically signed by Weston Brass MD Signature Date/Time: 08/01/2019/10:08:54 PM    Final    [place] EKG SITE RITE  Result Date: 07/31/2019 If Site Rite image not attached, placement could not be confirmed due to current cardiac rhythm.    LOS: 3 days   Lanae Boast, MD [facility]  08/02/2019, 8:18 AM

## 2019-08-02 NOTE — Progress Notes (Signed)
RN noted that BP cuff was regular size and taking readings on left forearm. Large cuff placed to left bicep appropriately.

## 2019-08-02 NOTE — Progress Notes (Signed)
Patient's blood pressure continues to be elevated.  Hydralazine does nothing to lower blood pressure.  Metoprolol helps, but only briefly.  The metoprolol was a one-time dose.  This RN paged the night coverage NP for an additional dose.

## 2019-08-02 NOTE — Progress Notes (Addendum)
Progress Note  Patient Name: Tracey Castillo Date of Encounter: 08/02/2019  Primary Cardiologist: New   Subjective   Pt doing well this AM. Reports chest pain has improved with pain meds. BP continues to be elevated. IVF stopped per IM   Inpatient Medications    Scheduled Meds: . aspirin EC  81 mg Oral Daily  . atorvastatin  80 mg Oral Daily  . carvedilol  25 mg Oral BID WC  . Chlorhexidine Gluconate Cloth  6 each Topical Daily  . enoxaparin (LOVENOX) injection  70 mg Subcutaneous Q24H  . insulin aspart  0-15 Units Subcutaneous TID WC  . insulin aspart  5 Units Subcutaneous TID WC  . insulin detemir  20 Units Subcutaneous Daily  . irbesartan  75 mg Oral Daily  . loratadine  10 mg Oral Daily  . metoCLOPramide (REGLAN) injection  5 mg Intravenous Q6H  . pantoprazole (PROTONIX) IV  40 mg Intravenous Q24H  . sodium chloride flush  10-40 mL Intracatheter Q12H   Continuous Infusions: . dextrose 5 % and 0.45% NaCl Stopped (08/01/19 0417)  . famotidine (PEPCID) IV     PRN Meds: acetaminophen, alum & mag hydroxide-simeth, dextrose, hydrALAZINE, HYDROmorphone (DILAUDID) injection, labetalol, ondansetron (ZOFRAN) IV, oxymetazoline, sodium chloride, sodium chloride flush   Vital Signs    Vitals:   08/02/19 0800 08/02/19 0900 08/02/19 1000 08/02/19 1100  BP: (!) 202/64 (!) 163/69 (!) 180/82 (!) 185/76  Pulse: 96 90 92 90  Resp:      Temp: 97.8 F (36.6 C)     TempSrc: Oral     SpO2: 99% 99% 98% 100%  Weight:      Height:        Intake/Output Summary (Last 24 hours) at 08/02/2019 1145 Last data filed at 08/02/2019 0500 Gross per 24 hour  Intake 1461.97 ml  Output 900 ml  Net 561.97 ml   Filed Weights   07/30/19 1132  Weight: (!) 140.2 kg    Physical Exam   General: Obese, NAD Skin: Warm, dry, intact  Lungs:Clear to ausculation bilaterally. Breathing is unlabored. Cardiovascular: RRR with S1 S2. No murmurs Abdomen: Soft, non-tender, non-distended. No  obvious abdominal masses. Extremities: No edema. Radial pulses 2+ bilaterally Neuro: Alert and oriented. No focal deficits. No facial asymmetry. MAE spontaneously. Psych: Responds to questions appropriately with normal affect.    Labs    Chemistry Recent Labs  Lab 07/30/19 1203 07/30/19 1210 07/31/19 0612 07/31/19 1027 07/31/19 1631 08/01/19 0622 08/02/19 0439  NA 133*   < > 138   < > 137 135 137  K 4.3   < > 3.4*   < > 3.8 4.3 4.0  CL 93*   < > 104   < > 105 105 109  CO2 10*   < > 24   < > 20* 20* 19*  GLUCOSE 737*   < > 209*   < > 230* 247* 198*  BUN 15   < > 14   < > 12 10 11   CREATININE 1.65*   < > 1.17*   < > 1.07* 0.81 0.74  CALCIUM 9.4   < > 8.5*   < > 8.4* 8.1* 8.4*  PROT 9.8*  --  8.3*  --   --   --  6.6  ALBUMIN 4.0  --  3.4*  --   --   --  2.6*  AST 48*  --  38  --   --   --  30  ALT 78*  --  57*  --   --   --  35  ALKPHOS 219*  --  137*  --   --   --  113  BILITOT 1.2  --  0.8  --   --   --  0.7  GFRNONAA 34*   < > 52*   < > 58* >60 >60  GFRAA 40*   < > 60*   < > >60 >60 >60  ANIONGAP 30*   < > 10   < > 12 10 9    < > = values in this interval not displayed.     Hematology Recent Labs  Lab 07/30/19 1203 07/30/19 1203 07/30/19 1210 07/31/19 0242 08/02/19 0439  WBC 13.6*  --   --  13.0* 6.5  RBC 5.22*  --   --  4.58 3.90  HGB 14.7   < > 16.3* 12.8 11.1*  HCT 44.4   < > 48.0* 40.3 34.5*  MCV 85.1  --   --  88.0 88.5  MCH 28.2  --   --  27.9 28.5  MCHC 33.1  --   --  31.8 32.2  RDW 14.8  --   --  15.0 15.3  PLT 442*  --   --  374 276   < > = values in this interval not displayed.    Cardiac EnzymesNo results for input(s): TROPONINI in the last 168 hours. No results for input(s): TROPIPOC in the last 168 hours.   BNPNo results for input(s): BNP, PROBNP in the last 168 hours.   DDimer No results for input(s): DDIMER in the last 168 hours.   Radiology    ECHOCARDIOGRAM COMPLETE  Result Date: 08/01/2019    ECHOCARDIOGRAM REPORT   Patient Name:    ZAIN BINGMAN Date of Exam: 08/01/2019 Medical Rec #:  08/03/2019         Height:       66.0 in Accession #:    [id number]        Weight:       309.0 lb Date of Birth:  Apr 05, 1960         BSA:          2.406 m Patient Age:    58 years          BP:           163/68 mmHg Patient Gender: F                 HR:           79 bpm. Exam Location:  Inpatient Procedure: 2D Echo and Intracardiac Opacification Agent Indications:    Chest Pain 786.50 / R07.9  History:        Patient has no prior history of Echocardiogram examinations.                 Risk Factors:Dyslipidemia and Obesity. AKI (acute kidney                 injury).  Sonographer:    05-26-1972 Turrentine Referring Phys: Leeroy Bock [id number]  Sonographer Comments: Suboptimal apical window and suboptimal subcostal window. Image acquisition challenging due to patient body habitus. IMPRESSIONS  1. Left ventricular ejection fraction, by estimation, is 70 to 75%. The left ventricle has hyperdynamic function, with midcavitary obstruction, maximum instantaneous gradient 29 mmHg (velocity 2.7 m/s) . The left ventricle has no regional wall motion abnormalities. Left ventricular diastolic parameters are consistent with Grade I  diastolic dysfunction (impaired relaxation).  2. Right ventricular systolic function is normal. The right ventricular size is normal.  3. The mitral valve is normal in structure. No evidence of mitral valve regurgitation. No evidence of mitral stenosis.  4. The aortic valve is tricuspid. Aortic valve regurgitation is not visualized. No aortic stenosis is present. Conclusion(s)/Recommendation(s): Normal coronary artery origins. FINDINGS  Left Ventricle: Left ventricular ejection fraction, by estimation, is 70 to 75%. The left ventricle has hyperdynamic function. The left ventricle has no regional wall motion abnormalities. Definity contrast agent was given IV to delineate the left ventricular endocardial borders. The left ventricular internal cavity  size was normal in size. There is no left ventricular hypertrophy. Left ventricular diastolic parameters are consistent with Grade I diastolic dysfunction (impaired relaxation). Right Ventricle: The right ventricular size is normal. No increase in right ventricular wall thickness. Right ventricular systolic function is normal. Left Atrium: Left atrial size was normal in size. Right Atrium: Right atrial size was normal in size. Pericardium: There is no evidence of pericardial effusion. Mitral Valve: The mitral valve is normal in structure. Normal mobility of the mitral valve leaflets. No evidence of mitral valve regurgitation. No evidence of mitral valve stenosis. Tricuspid Valve: The tricuspid valve is normal in structure. Tricuspid valve regurgitation is trivial. No evidence of tricuspid stenosis. Aortic Valve: The aortic valve is tricuspid. Aortic valve regurgitation is not visualized. No aortic stenosis is present. Aortic valve mean gradient measures 9.0 mmHg. Aortic valve peak gradient measures 15.4 mmHg. Aortic valve area, by VTI measures 1.67  cm. Pulmonic Valve: The pulmonic valve was normal in structure. Pulmonic valve regurgitation is trivial. No evidence of pulmonic stenosis. Aorta: The aortic root is normal in size and structure. Venous: The inferior vena cava was not well visualized. IAS/Shunts: The interatrial septum was not well visualized.  LEFT VENTRICLE PLAX 2D LVIDd:         3.90 cm  Diastology LVIDs:         2.40 cm  LV e' lateral:   7.07 cm/s LV PW:         1.00 cm  LV E/e' lateral: 13.3 LV IVS:        1.00 cm  LV e' medial:    6.53 cm/s LVOT diam:     1.80 cm  LV E/e' medial:  14.4 LV SV:         56 LV SV Index:   23 LVOT Area:     2.54 cm  LEFT ATRIUM           Index       RIGHT ATRIUM           Index LA diam:      3.40 cm 1.41 cm/m  RA Area:     14.10 cm LA Vol (A4C): 34.1 ml 14.17 ml/m RA Volume:   37.90 ml  15.75 ml/m  AORTIC VALVE AV Area (Vmax):    1.42 cm AV Area (Vmean):   1.46  cm AV Area (VTI):     1.67 cm AV Vmax:           196.00 cm/s AV Vmean:          140.000 cm/s AV VTI:            0.338 m AV Peak Grad:      15.4 mmHg AV Mean Grad:      9.0 mmHg LVOT Vmax:         109.00 cm/s LVOT Vmean:  80.300 cm/s LVOT VTI:          0.222 m LVOT/AV VTI ratio: 0.66  AORTA Ao Root diam: 2.30 cm MITRAL VALVE MV Area (PHT): 3.74 cm     SHUNTS MV Decel Time: 203 msec     Systemic VTI:  0.22 m MV E velocity: 93.80 cm/s   Systemic Diam: 1.80 cm MV A velocity: 102.00 cm/s MV E/A ratio:  0.92 Weston Brass MD Electronically signed by Weston Brass MD Signature Date/Time: 08/01/2019/10:08:54 PM    Final    [place] EKG SITE RITE  Result Date: 07/31/2019 If Site Rite image not attached, placement could not be confirmed due to current cardiac rhythm.  Telemetry    08/02/19 NSR 90's - Personally Reviewed  ECG    No new tracing as of 08/02/19- Personally Reviewed  Cardiac Studies   Echo 08/01/19:  1. Left ventricular ejection fraction, by estimation, is 70 to 75%. The  left ventricle has hyperdynamic function, with midcavitary obstruction,  maximum instantaneous gradient 29 mmHg (velocity 2.7 m/s) . The left  ventricle has no regional wall motion  abnormalities. Left ventricular diastolic parameters are consistent with  Grade I diastolic dysfunction (impaired relaxation).  2. Right ventricular systolic function is normal. The right ventricular  size is normal.  3. The mitral valve is normal in structure. No evidence of mitral valve  regurgitation. No evidence of mitral stenosis.  4. The aortic valve is tricuspid. Aortic valve regurgitation is not  visualized. No aortic stenosis is present.   Patient Profile     59 y.o. female with a hx of type 2 diabetes, gout hypertension hyperlipidemia who is being seen today for the evaluation of elevated troponin at the request of Lanae Boast, MD.  Assessment & Plan    1. Elevated hsT with chest pain: -Likely reflects demand  ischemia in the setting of DKA -hsT, 36>45>22 -EKG remains WNL with no acute changes however givern her most recent symptoms of what sounds like exertional chest pain>>plan is for CCTA when stable from DKA.  -Carvedilol increased to 12.5mg  08/01/19>>>would favor increasing further to 25mg  PO BID  -Also started on ASA 81 and atorvastatin 80 -Plan for CCTA  2. HTN: -Uncontrolled, 202/64>159/70>164/67 -Currently on carvedilol 12.5>>increase to 25mg  pO BID -IVF stopped per IM -Would also consider adding ARB to regimen   -Creatinine stable at 0.74  3. HLD: -Started on atorvastatin 80mg  PO QD 08/01/19 -Lipid panel  -Needs f/u lipid and LFTs in 6-8 weeks    Other hospital issues per primary team include: -DKA   Signed, NP-C [facility] Pager: [phone] 08/02/2019, 11:45 AM     For questions or updates, please contact   Please consult [email or web address] for contact info under Cardiology/STEMI.  The patient was seen, examined and discussed with Georgie Chard, NP  and I agree with the above.   The patient was experiencing palpitations earlier this morning however EKG only shows sinus rhythm with ventricular rates in 90s to 100.  She has mild chest pressure, no acute ischemic changes on her EKG her blood pressure and heart rate remain elevated.  Will wait another day and proceed with coronary CTA tomorrow.  I would increase her carvedilol to 25 mg p.o. twice daily.  Also start irbesartan 75 mg daily.  Echocardiogram showed hyperdynamic LVEF over 70% with mid cavitary obstruction with maximum instantaneous gradient of 30 mmHg, therefore she will definitely benefit from good beta-blockade.  She has no significant valvular abnormalities.  [phone], MD  08/02/2019   

## 2019-08-03 DIAGNOSIS — K219 Gastro-esophageal reflux disease without esophagitis: Secondary | ICD-10-CM

## 2019-08-03 LAB — COMPREHENSIVE METABOLIC PANEL
ALT: 33 U/L (ref 0–44)
AST: 31 U/L (ref 15–41)
Albumin: 2.8 g/dL — ABNORMAL LOW (ref 3.5–5.0)
Alkaline Phosphatase: 116 U/L (ref 38–126)
Anion gap: 10 (ref 5–15)
BUN: 11 mg/dL (ref 6–20)
CO2: 20 mmol/L — ABNORMAL LOW (ref 22–32)
Calcium: 8.5 mg/dL — ABNORMAL LOW (ref 8.9–10.3)
Chloride: 107 mmol/L (ref 98–111)
Creatinine, Ser: 0.75 mg/dL (ref 0.44–1.00)
GFR calc Af Amer: >60 mL/min (ref >60)
GFR calc non Af Amer: >60 mL/min (ref >60)
Glucose, Bld: 239 mg/dL — ABNORMAL HIGH (ref 70–99)
Potassium: 4 mmol/L (ref 3.5–5.1)
Sodium: 137 mmol/L (ref 135–145)
Total Bilirubin: 0.5 mg/dL (ref 0.3–1.2)
Total Protein: 6.6 g/dL (ref 6.5–8.1)

## 2019-08-03 LAB — GLUCOSE, CAPILLARY
Glucose-Capillary: 230 mg/dL — ABNORMAL HIGH (ref 70–99)
Glucose-Capillary: 134 mg/dL — ABNORMAL HIGH (ref 70–99)
Glucose-Capillary: 221 mg/dL — ABNORMAL HIGH (ref 70–99)
Glucose-Capillary: 169 mg/dL — ABNORMAL HIGH (ref 70–99)

## 2019-08-03 MED ORDER — IVABRADINE HCL 5 MG PO TABS
15.0000 mg | ORAL_TABLET | Freq: Once | ORAL | Status: AC
  Administered 2019-08-04: 15 mg via ORAL
  Filled 2019-08-03 (×2): qty 3

## 2019-08-03 MED ORDER — IRBESARTAN 150 MG PO TABS
150.0000 mg | ORAL_TABLET | Freq: Every day | ORAL | Status: DC
  Administered 2019-08-03 – 2019-08-04 (×2): 150 mg via ORAL
  Filled 2019-08-03 (×2): qty 1

## 2019-08-03 MED ORDER — PANTOPRAZOLE SODIUM 40 MG PO TBEC
40.0000 mg | DELAYED_RELEASE_TABLET | Freq: Every day | ORAL | Status: DC
  Administered 2019-08-03: 40 mg via ORAL
  Filled 2019-08-03: qty 1

## 2019-08-03 NOTE — Progress Notes (Signed)
Pt arrived to room 1419. VSS, cardiac monitoring initiated. Pt resting comfortably in bed.   Edras Wilford, Lavone Orn, RN

## 2019-08-03 NOTE — Progress Notes (Signed)
PROGRESS NOTE  Darianny Momon  BTD:[id number] DOB: [birth year] DOA: 07/30/2019 PCP: [facility] Complaints: Nausea and vomiting  Brief Narrative: 59 year old female with T2DM, GERD, COPD, HLD, MDD who presented to [facility] bid with 5 days of intractable nausea vomiting.  In the ED he was tachycardic, afebrile with mild leukocytosis and work-up significant for uncontrolled hyperglycemia blood sugar 737 bicarb 10 anion gap 30 pH 7.4 UA more than 80 ketones no signs of UTI chest x-ray and CT abdomen pelvis without contrast no acute finding.  Patient was found to be in DKA and was transferred to [facility].  Patient was treated with IV insulin drip with DKA protocol and subsequently transitioned to subcu insulin.  Patient had a severe uncontrolled hypertension antihypertensive regimen was initiated, seen by cardiology for chest pain.  Subjective:  No nausea and vomiting, some abd pain on lower abdomen. No diarrhea  no fever no chest pain Blood pressure is stabilizing overnight.  Assessment & Plan:  DKA in the setting of T2DM: With dietary indiscretion at home patient drinking lots of soda and eating lots of chocolate.  She has been extensively counseled by myself and seen by diabetic coordinator. DKA anion gap closed and has been transitioned to basal bolus insulin regimen 5/19.  Poorly controlled T2DM: hba1c12.7.diabetes: On board.  Blood sugar improving on current regimen Lantus 20 units and premeal insulin 5 units TID and ssi.  Continue to monitor and adjust insulin regimen. Recent Labs  Lab 08/02/19 0743 08/02/19 1118 08/02/19 1737 08/02/19 2226 08/03/19 0816  GLUCAP 186* 193* 160* 185* 230*   Poorly controlled hypertension: BP has been uncontrolled since admission.  Also tachycardic.  Reportedly she was taken off her antihypertensive by her PCP.  Initially started on low-dose Coreg and at 25 mg twice daily 5/20, added irbesartan 5/21, appreciate cardiology input on the  management.  Patient has a grade 1 diastolic dysfunction and hyperdynamic function EF 70% and will benefit with beta-blocker.  AKI due to DKA/dehydration, volume depletion: AKI resolved.  Continue gentle fluids.   Recent Labs  Lab 07/31/19 1027 07/31/19 1631 08/01/19 0622 08/02/19 0439 08/03/19 0301  BUN 13 12 10 11 11   CREATININE 1.09* 1.07* 0.81 0.74 0.75   Intractable nausea and vomiting: resolved. ct abd negative.  Lipase normal, LFTs was slightly abnormal, repeat LFTs. Cont reglan and ppi.Continue supportive measures, ivf.  Chest pain/mildly positive troponin 40s->20s, positive troponin likely due to demand mismatch from severe metabolic derangement with DKA/ She c/o some chest discomfort, and also endorses exertional chest pain at home, given risk factors cardiology was consulted-and is planning for coronary CTA tomorrow. . Continue Coreg 25 twice daily, losartan, Lipitor and aspirin as per cardiology.  Echo shows normal EF/hyperdynamic with no regional wall motion abnormalities.   Headache-has chronic headache at home and sometime gets migraine attacks at home and gets "Cocktail' in ED when flares up.cont tylenol.  Hypokalemia-improved.  Obesity with BMI 49: Will benefit with weight loss PCP follow-up negative study.    Dyslipidemia: On Lipitor  GERD:cont ppi  Gout: On allopurinol.resume  MDD -cont home meds  DVT prophylaxis:lovenox Code Status:Full  Family Communication: plan of care discussed with patient at bedside. Patient was seen again this afternoon and reevaluated.  Status is: Inpatient Remains inpatient appropriate because:Persistent severe electrolyte disturbances and IV treatments appropriate due to intensity of illness or inability to take PO for further management of patient's cardiac issues, need cardiac clearance prior to discharge  Dispo: The patient is from:  Home              Anticipated d/c is to: Home              Anticipated d/c date is: 1-2 days               Patient currently is not medically stable to d/c. Nutrition: Diet Order            Diet Carb Modified Fluid consistency: Thin; Room service appropriate? Yes  Diet effective now               Body mass index is 49.87 kg/m.  Consultants:see note  Procedures:see note Microbiology:see note  Medications: Scheduled Meds: . aspirin EC  81 mg Oral Daily  . atorvastatin  80 mg Oral Daily  . carvedilol  25 mg Oral BID WC  . Chlorhexidine Gluconate Cloth  6 each Topical Daily  . enoxaparin (LOVENOX) injection  70 mg Subcutaneous Q24H  . insulin aspart  0-15 Units Subcutaneous TID WC  . insulin aspart  5 Units Subcutaneous TID WC  . insulin detemir  20 Units Subcutaneous Daily  . irbesartan  150 mg Oral Daily  . [START ON 08/04/2019] ivabradine  15 mg Oral Once  . loratadine  10 mg Oral Daily  . metoCLOPramide (REGLAN) injection  5 mg Intravenous Q6H  . pantoprazole (PROTONIX) IV  40 mg Intravenous Q24H  . sodium chloride flush  10-40 mL Intracatheter Q12H   Continuous Infusions:   Antimicrobials: Anti-infectives (From admission, onward)   None       Objective: Vitals: Today's Vitals   08/03/19 0204 08/03/19 0500 08/03/19 0638 08/03/19 0701  BP:  (!) 161/64    Pulse:  87    Resp:  14    Temp:  97.7 F (36.5 C)  98.2 F (36.8 C)  TempSrc:  Oral  Oral  SpO2:  99%    Weight:      Height:      PainSc: 10-Worst pain ever 9  3      Intake/Output Summary (Last 24 hours) at 08/03/2019 0909 Last data filed at 08/03/2019 0527 Gross per 24 hour  Intake --  Output 650 ml  Net -650 ml   Filed Weights   07/30/19 1132  Weight: (!) 140.2 kg   Weight change:    Intake/Output from previous day: 05/21 0701 - 05/22 0700 In: -  Out: 650 [Urine:650] Intake/Output this shift: No intake/output data recorded.  Examination: General exam: AAOx3 ,Obese, On RA,NAD, weak appearing. HEENT:Oral mucosa moist, Ear/Nose WNL grossly, dentition normal. Respiratory system:  bilaterally clear,no wheezing or crackles,no use of accessory muscle Cardiovascular system: S1 & S2 +, No JVD,. Gastrointestinal system: Abdomen soft, NT,ND, BS+ Nervous System:Alert, awake, moving extremities and grossly nonfocal Extremities: No edema, distal peripheral pulses palpable.  Skin: No rashes,no icterus. MSK: Normal muscle bulk,tone, power.  Data Reviewed: I have personally reviewed following labs and imaging studies CBC: Recent Labs  Lab 07/30/19 1203 07/30/19 1210 07/31/19 0242 08/02/19 0439  WBC 13.6*  --  13.0* 6.5  NEUTROABS 11.7*  --   --   --   HGB 14.7 16.3* 12.8 11.1*  HCT 44.4 48.0* 40.3 34.5*  MCV 85.1  --  88.0 88.5  PLT 442*  --  374 588   Basic Metabolic Panel: Recent Labs  Lab 07/30/19 2011 07/30/19 2203 07/31/19 1027 07/31/19 1631 08/01/19 0622 08/02/19 0439 08/03/19 0301  NA 137   < > 141 137 135 137  137  K 3.7   < > 4.0 3.8 4.3 4.0 4.0  CL 103   < > 109 105 105 109 107  CO2 18*   < > 22 20* 20* 19* 20*  GLUCOSE 392*   < > 188* 230* 247* 198* 239*  BUN 14   < > 13 12 10 11 11   CREATININE 1.20*   < > 1.09* 1.07* 0.81 0.74 0.75  CALCIUM 8.6*   < > 8.4* 8.4* 8.1* 8.4* 8.5*  MG 1.7  --   --   --   --   --   --    < > = values in this interval not displayed.   GFR: Estimated Creatinine Clearance: 112.3 mL/min (by C-G formula based on SCr of 0.75 mg/dL). Liver Function Tests: Recent Labs  Lab 07/30/19 1203 07/31/19 0612 08/02/19 0439 08/03/19 0301  AST 48* 38 30 31  ALT 78* 57* 35 33  ALKPHOS 219* 137* 113 116  BILITOT 1.2 0.8 0.7 0.5  PROT 9.8* 8.3* 6.6 6.6  ALBUMIN 4.0 3.4* 2.6* 2.8*   Recent Labs  Lab 07/30/19 2011  LIPASE 29   No results for input(s): AMMONIA in the last 168 hours. Coagulation Profile: No results for input(s): INR, PROTIME in the last 168 hours. Cardiac Enzymes: No results for input(s): CKTOTAL, CKMB, CKMBINDEX, TROPONINI in the last 168 hours. BNP (last 3 results) No results for input(s): PROBNP in the  last 8760 hours. HbA1C: No results for input(s): HGBA1C in the last 72 hours. Lipid Profile: No results for input(s): CHOL, HDL, LDLCALC, TRIG, CHOLHDL, LDLDIRECT in the last 72 hours. Thyroid Function Tests: No results for input(s): TSH, T4TOTAL, FREET4, T3FREE, THYROIDAB in the last 72 hours. Anemia Panel: No results for input(s): VITAMINB12, FOLATE, FERRITIN, TIBC, IRON, RETICCTPCT in the last 72 hours. Sepsis Labs: No results for input(s): PROCALCITON, LATICACIDVEN in the last 168 hours.  Recent Results (from the past 240 hour(s))  SARS Coronavirus 2 by RT PCR (hospital order, performed in [facility] hospital lab) Nasopharyngeal Nasopharyngeal Swab     Status: None   Collection Time: 07/30/19 12:18 PM   Specimen: Nasopharyngeal Swab  Result Value Ref Range Status   SARS Coronavirus 2 NEGATIVE NEGATIVE Final    Comment: (NOTE) SARS-CoV-2 target nucleic acids are NOT DETECTED. The SARS-CoV-2 RNA is generally detectable in upper and lower respiratory specimens during the acute phase of infection. The lowest concentration of SARS-CoV-2 viral copies this assay can detect is 250 copies / mL. A negative result does not preclude SARS-CoV-2 infection and should not be used as the sole basis for treatment or other patient management decisions.  A negative result may occur with improper specimen collection / handling, submission of specimen other than nasopharyngeal swab, presence of viral mutation(s) within the areas targeted by this assay, and inadequate number of viral copies (<250 copies / mL). A negative result must be combined with clinical observations, patient history, and epidemiological information. Fact Sheet for Patients:   BoilerBrush.com.cyhttps://www.fda.gov/media/136312/download Fact Sheet for Healthcare Providers: https://pope.com/https://www.fda.gov/media/136313/download This test is not yet approved or cleared  by the [place] FDA and has been authorized for detection and/or diagnosis of  SARS-CoV-2 by FDA under an Emergency Use Authorization (EUA).  This EUA will remain in effect (meaning this test can be used) for the duration of the COVID-19 declaration under Section 564(b)(1) of the Act, 21 U.S.C. section 360bbb-3(b)(1), unless the authorization is terminated or revoked sooner. Performed at [facility], [address] [place], [place] [address]   MRSA PCR Screening     Status: None   Collection Time: 07/30/19  6:16 PM   Specimen: Nasal Mucosa; Nasopharyngeal  Result Value Ref Range Status   MRSA by PCR NEGATIVE NEGATIVE Final    Comment:        The GeneXpert MRSA Assay (FDA approved for NASAL specimens only), is one component of a comprehensive MRSA colonization surveillance program. It is not intended to diagnose MRSA infection nor to guide or monitor treatment for MRSA infections. Performed at [facility], [address] [place], [place] [address]       Radiology Studies: ECHOCARDIOGRAM COMPLETE  Result Date: 08/01/2019    ECHOCARDIOGRAM REPORT   Patient Name:   TACHA MANNI Date of Exam: 08/01/2019 Medical Rec #:  [id number]         Height:       66.0 in Accession #:    [id number]        Weight:       309.0 lb Date of Birth:  12/22/1960         BSA:          2.406 m Patient Age:    58 years          BP:           163/68 mmHg Patient Gender: F                 HR:           79 bpm. Exam Location:  Inpatient Procedure: 2D Echo and Intracardiac Opacification Agent Indications:    Chest Pain 786.50 / R07.9  History:        Patient has no prior history of Echocardiogram examinations.                 Risk Factors:Dyslipidemia and Obesity. AKI (acute kidney                 injury).  Sonographer:    Leeroy Bock Turrentine Referring Phys: [id number] Lars Masson  Sonographer Comments: Suboptimal apical window and suboptimal subcostal window. Image acquisition challenging due to patient body habitus. IMPRESSIONS  1. Left ventricular  ejection fraction, by estimation, is 70 to 75%. The left ventricle has hyperdynamic function, with midcavitary obstruction, maximum instantaneous gradient 29 mmHg (velocity 2.7 m/s) . The left ventricle has no regional wall motion abnormalities. Left ventricular diastolic parameters are consistent with Grade I diastolic dysfunction (impaired relaxation).  2. Right ventricular systolic function is normal. The right ventricular size is normal.  3. The mitral valve is normal in structure. No evidence of mitral valve regurgitation. No evidence of mitral stenosis.  4. The aortic valve is tricuspid. Aortic valve regurgitation is not visualized. No aortic stenosis is present. Conclusion(s)/Recommendation(s): Normal coronary artery origins. FINDINGS  Left Ventricle: Left ventricular ejection fraction, by estimation, is 70 to 75%. The left ventricle has hyperdynamic function. The left ventricle has no regional wall motion abnormalities. Definity contrast agent was given IV to delineate the left ventricular endocardial borders. The left ventricular internal cavity size was normal in size. There is no left ventricular hypertrophy. Left ventricular diastolic parameters are consistent with Grade I diastolic dysfunction (impaired relaxation). Right Ventricle: The right ventricular size is normal. No increase in right ventricular wall thickness. Right ventricular systolic function is normal. Left Atrium: Left atrial size was normal in size. Right Atrium: Right atrial size was normal in size. Pericardium: There is no  evidence of pericardial effusion. Mitral Valve: The mitral valve is normal in structure. Normal mobility of the mitral valve leaflets. No evidence of mitral valve regurgitation. No evidence of mitral valve stenosis. Tricuspid Valve: The tricuspid valve is normal in structure. Tricuspid valve regurgitation is trivial. No evidence of tricuspid stenosis. Aortic Valve: The aortic valve is tricuspid. Aortic valve  regurgitation is not visualized. No aortic stenosis is present. Aortic valve mean gradient measures 9.0 mmHg. Aortic valve peak gradient measures 15.4 mmHg. Aortic valve area, by VTI measures 1.67  cm. Pulmonic Valve: The pulmonic valve was normal in structure. Pulmonic valve regurgitation is trivial. No evidence of pulmonic stenosis. Aorta: The aortic root is normal in size and structure. Venous: The inferior vena cava was not well visualized. IAS/Shunts: The interatrial septum was not well visualized.  LEFT VENTRICLE PLAX 2D LVIDd:         3.90 cm  Diastology LVIDs:         2.40 cm  LV e' lateral:   7.07 cm/s LV PW:         1.00 cm  LV E/e' lateral: 13.3 LV IVS:        1.00 cm  LV e' medial:    6.53 cm/s LVOT diam:     1.80 cm  LV E/e' medial:  14.4 LV SV:         56 LV SV Index:   23 LVOT Area:     2.54 cm  LEFT ATRIUM           Index       RIGHT ATRIUM           Index LA diam:      3.40 cm 1.41 cm/m  RA Area:     14.10 cm LA Vol (A4C): 34.1 ml 14.17 ml/m RA Volume:   37.90 ml  15.75 ml/m  AORTIC VALVE AV Area (Vmax):    1.42 cm AV Area (Vmean):   1.46 cm AV Area (VTI):     1.67 cm AV Vmax:           196.00 cm/s AV Vmean:          140.000 cm/s AV VTI:            0.338 m AV Peak Grad:      15.4 mmHg AV Mean Grad:      9.0 mmHg LVOT Vmax:         109.00 cm/s LVOT Vmean:        80.300 cm/s LVOT VTI:          0.222 m LVOT/AV VTI ratio: 0.66  AORTA Ao Root diam: 2.30 cm MITRAL VALVE MV Area (PHT): 3.74 cm     SHUNTS MV Decel Time: 203 msec     Systemic VTI:  0.22 m MV E velocity: 93.80 cm/s   Systemic Diam: 1.80 cm MV A velocity: 102.00 cm/s MV E/A ratio:  0.92 Weston Brass MD Electronically signed by Weston Brass MD Signature Date/Time: 08/01/2019/10:08:54 PM    Final      LOS: 4 days   Lanae Boast, MD [facility]  08/03/2019, 9:09 AM

## 2019-08-03 NOTE — Progress Notes (Signed)
Progress Note  Patient Name: Tracey Castillo Date of Encounter: 08/03/2019  Primary Cardiologist: New   Subjective   Pt doing well this AM. Reports chest pain has improved with pain meds. BP continues to be elevated. IVF stopped per IM   Inpatient Medications    Scheduled Meds: . aspirin EC  81 mg Oral Daily  . atorvastatin  80 mg Oral Daily  . carvedilol  25 mg Oral BID WC  . Chlorhexidine Gluconate Cloth  6 each Topical Daily  . enoxaparin (LOVENOX) injection  70 mg Subcutaneous Q24H  . insulin aspart  0-15 Units Subcutaneous TID WC  . insulin aspart  5 Units Subcutaneous TID WC  . insulin detemir  20 Units Subcutaneous Daily  . irbesartan  75 mg Oral Daily  . loratadine  10 mg Oral Daily  . metoCLOPramide (REGLAN) injection  5 mg Intravenous Q6H  . pantoprazole (PROTONIX) IV  40 mg Intravenous Q24H  . sodium chloride flush  10-40 mL Intracatheter Q12H   Continuous Infusions:  PRN Meds: acetaminophen, alum & mag hydroxide-simeth, dextrose, hydrALAZINE, labetalol, morphine injection, ondansetron (ZOFRAN) IV, oxymetazoline, sodium chloride, sodium chloride flush   Vital Signs    Vitals:   08/02/19 2100 08/02/19 2300 08/03/19 0000 08/03/19 0500  BP: (!) 112/47 (!) 113/49 (!) 123/52 (!) 161/64  Pulse: 91 89 92 87  Resp:    14  Temp:    97.7 F (36.5 C)  TempSrc:    Oral  SpO2: 99% 100% 100% 99%  Weight:      Height:        Intake/Output Summary (Last 24 hours) at 08/03/2019 0810 Last data filed at 08/03/2019 0527 Gross per 24 hour  Intake -  Output 650 ml  Net -650 ml   Filed Weights   07/30/19 1132  Weight: (!) 140.2 kg    Physical Exam   General: Obese, NAD Skin: Warm, dry, intact  Lungs:Clear to ausculation bilaterally. Breathing is unlabored. Cardiovascular: RRR with S1 S2. No murmurs Abdomen: Soft, non-tender, non-distended. No obvious abdominal masses. Extremities: No edema. Radial pulses 2+ bilaterally Neuro: Alert and oriented. No focal  deficits. No facial asymmetry. MAE spontaneously. Psych: Responds to questions appropriately with normal affect.    Labs    Chemistry Recent Labs  Lab 07/31/19 0612 07/31/19 1027 08/01/19 0622 08/02/19 0439 08/03/19 0301  NA 138   < > 135 137 137  K 3.4*   < > 4.3 4.0 4.0  CL 104   < > 105 109 107  CO2 24   < > 20* 19* 20*  GLUCOSE 209*   < > 247* 198* 239*  BUN 14   < > 10 11 11   CREATININE 1.17*   < > 0.81 0.74 0.75  CALCIUM 8.5*   < > 8.1* 8.4* 8.5*  PROT 8.3*  --   --  6.6 6.6  ALBUMIN 3.4*  --   --  2.6* 2.8*  AST 38  --   --  30 31  ALT 57*  --   --  35 33  ALKPHOS 137*  --   --  113 116  BILITOT 0.8  --   --  0.7 0.5  GFRNONAA 52*   < > >60 >60 >60  GFRAA 60*   < > >60 >60 >60  ANIONGAP 10   < > 10 9 10    < > = values in this interval not displayed.     Hematology Recent Labs  Lab 07/30/19 1203 07/30/19 1203 07/30/19 1210 07/31/19 0242 08/02/19 0439  WBC 13.6*  --   --  13.0* 6.5  RBC 5.22*  --   --  4.58 3.90  HGB 14.7   < > 16.3* 12.8 11.1*  HCT 44.4   < > 48.0* 40.3 34.5*  MCV 85.1  --   --  88.0 88.5  MCH 28.2  --   --  27.9 28.5  MCHC 33.1  --   --  31.8 32.2  RDW 14.8  --   --  15.0 15.3  PLT 442*  --   --  374 276   < > = values in this interval not displayed.    Cardiac EnzymesNo results for input(s): TROPONINI in the last 168 hours. No results for input(s): TROPIPOC in the last 168 hours.   BNPNo results for input(s): BNP, PROBNP in the last 168 hours.   DDimer No results for input(s): DDIMER in the last 168 hours.   Radiology     Telemetry    08/02/19 NSR 90's - Personally Reviewed  ECG    No new tracing as of 08/02/19- Personally Reviewed  Cardiac Studies   Echo 08/01/19:  1. Left ventricular ejection fraction, by estimation, is 70 to 75%. The  left ventricle has hyperdynamic function, with midcavitary obstruction,  maximum instantaneous gradient 29 mmHg (velocity 2.7 m/s) . The left  ventricle has no regional wall motion   abnormalities. Left ventricular diastolic parameters are consistent with  Grade I diastolic dysfunction (impaired relaxation).  2. Right ventricular systolic function is normal. The right ventricular  size is normal.  3. The mitral valve is normal in structure. No evidence of mitral valve  regurgitation. No evidence of mitral stenosis.  4. The aortic valve is tricuspid. Aortic valve regurgitation is not  visualized. No aortic stenosis is present.   Patient Profile     59 y.o. female with a hx of type 2 diabetes, gout hypertension hyperlipidemia who is being seen today for the evaluation of elevated troponin at the request of Lanae Boast, MD.  Assessment & Plan    1. Elevated hsT with chest pain: -Likely reflects demand ischemia in the setting of DKA -hsT, 36>45>22 -EKG remains WNL with no acute changes however givern her most recent symptoms of what sounds like exertional chest pain>>plan is for CCTA when stable from DKA.  -Carvedilol increased to 12.5mg  08/01/19>>>would favor increasing further to 25mg  PO BID  -Also started on ASA 81 and atorvastatin 80 -Plan for CCTA - mild chest pressure on and off - ventricular rates still in 90s  - carvedilol was increased to 25 mg p.o. twice daily.  - start irbesartan 75 mg daily.   - Echocardiogram showed hyperdynamic LVEF over 70% with mid cavitary obstruction with maximum instantaneous gradient of 30 mmHg, therefore she will definitely benefit from good beta-blockade.  She has no significant valvular abnormalities. - I will plan for a CCTA tomorrow with ivabradine 15 mg po x 1 in the morning for HR control   2. HTN: -Uncontrolled, 202/64>159/70>164/67 -Currently on carvedilol 12.5>>increase to 25mg  pO BID, irbesartan added, I will increase to 150 mg po daily -IVF stopped per IM -Creatinine stable at 0.74  3. HLD: -Started on atorvastatin 80mg  PO QD 08/01/19 -Lipid panel  -Needs f/u lipid and LFTs in 6-8 weeks    Other hospital  issues per primary team include: -DKA  , MD 08/03/2019'

## 2019-08-04 ENCOUNTER — Inpatient Hospital Stay (HOSPITAL_COMMUNITY): Payer: Medicaid Other

## 2019-08-04 DIAGNOSIS — R079 Chest pain, unspecified: Secondary | ICD-10-CM

## 2019-08-04 LAB — COMPREHENSIVE METABOLIC PANEL
ALT: 42 U/L (ref 0–44)
AST: 67 U/L — ABNORMAL HIGH (ref 15–41)
Albumin: 3 g/dL — ABNORMAL LOW (ref 3.5–5.0)
Alkaline Phosphatase: 118 U/L (ref 38–126)
Anion gap: 11 (ref 5–15)
BUN: 8 mg/dL (ref 6–20)
CO2: 21 mmol/L — ABNORMAL LOW (ref 22–32)
Calcium: 8.7 mg/dL — ABNORMAL LOW (ref 8.9–10.3)
Chloride: 109 mmol/L (ref 98–111)
Creatinine, Ser: 0.71 mg/dL (ref 0.44–1.00)
GFR calc Af Amer: >60 mL/min (ref >60)
GFR calc non Af Amer: >60 mL/min (ref >60)
Glucose, Bld: 190 mg/dL — ABNORMAL HIGH (ref 70–99)
Potassium: 3.7 mmol/L (ref 3.5–5.1)
Sodium: 141 mmol/L (ref 135–145)
Total Bilirubin: 0.5 mg/dL (ref 0.3–1.2)
Total Protein: 6.9 g/dL (ref 6.5–8.1)

## 2019-08-04 LAB — GLUCOSE, CAPILLARY
Glucose-Capillary: 141 mg/dL — ABNORMAL HIGH (ref 70–99)
Glucose-Capillary: 145 mg/dL — ABNORMAL HIGH (ref 70–99)

## 2019-08-04 MED ORDER — IRBESARTAN 150 MG PO TABS: 150.0000 mg | ORAL_TABLET | Freq: Every day | ORAL | 0 refills | Status: DC

## 2019-08-04 MED ORDER — NOVOLOG FLEXPEN 100 UNIT/ML ~~LOC~~ SOPN: 5.0000 [IU] | PEN_INJECTOR | Freq: Three times a day (TID) | SUBCUTANEOUS | 1 refills | Status: AC

## 2019-08-04 MED ORDER — METOPROLOL TARTRATE 50 MG PO TABS
100.0000 mg | ORAL_TABLET | Freq: Once | ORAL | Status: AC
  Administered 2019-08-04: 100 mg via ORAL
  Filled 2019-08-04: qty 2

## 2019-08-04 MED ORDER — LANTUS SOLOSTAR 100 UNIT/ML ~~LOC~~ SOPN: 20.0000 [IU] | PEN_INJECTOR | Freq: Every day | SUBCUTANEOUS | 1 refills | Status: AC

## 2019-08-04 MED ORDER — ATORVASTATIN CALCIUM 80 MG PO TABS: 80.0000 mg | ORAL_TABLET | Freq: Every day | ORAL | 0 refills | Status: DC

## 2019-08-04 MED ORDER — IOHEXOL 350 MG/ML SOLN
80.0000 mL | Freq: Once | INTRAVENOUS | Status: AC | PRN
  Administered 2019-08-04: 80 mL via INTRAVENOUS

## 2019-08-04 MED ORDER — BLOOD GLUCOSE METER KIT: PACK | 0 refills | Status: AC

## 2019-08-04 MED ORDER — PANTOPRAZOLE SODIUM 40 MG PO TBEC: 40.0000 mg | DELAYED_RELEASE_TABLET | Freq: Every day | ORAL | 0 refills | Status: DC

## 2019-08-04 MED ORDER — CARVEDILOL 25 MG PO TABS: 25.0000 mg | ORAL_TABLET | Freq: Two times a day (BID) | ORAL | 0 refills | Status: DC

## 2019-08-04 MED ORDER — "PEN NEEDLES 3/16"" 31G X 5 MM MISC": 1.0000 | Freq: Four times a day (QID) | 0 refills | Status: AC

## 2019-08-04 MED ORDER — ASPIRIN 81 MG PO TBEC: 81.0000 mg | DELAYED_RELEASE_TABLET | Freq: Every day | ORAL | 0 refills | Status: AC

## 2019-08-04 NOTE — Progress Notes (Signed)
Patient requesting we do not share information with her daughter, [name].

## 2019-08-04 NOTE — Discharge Summary (Signed)
Physician Discharge Summary  Tracey Castillo HVF:[id number] DOB: 09-04-1960 DOA: 07/30/2019  PCP: [facility]  Admit date: 07/30/2019 Discharge date: 08/04/2019  Admitted From: home Disposition:  home  Recommendations for Outpatient Follow-up:  1. Follow up with PCP in 1-2 weeks, follow-up with cardiology as needed 2. Please obtain BMP/CBC in one week 3. Please follow up on the following pending results:  Home Health:no  Equipment/Devices: none  Discharge Condition: Stable Code Status: full Diet recommendation:  Diet Order            Diet Carb Modified        Diet Carb Modified Fluid consistency: Thin; Room service appropriate? Yes  Diet effective now               Brief/Interim Summary:  59 year old female with T2DM, GERD, COPD, HLD, MDD who presented to [facility] bid with 5 days of intractable nausea vomiting.  In the ED he was tachycardic, afebrile with mild leukocytosis and work-up significant for uncontrolled hyperglycemia blood sugar 737 bicarb 10 anion gap 30 pH 7.4 UA more than 80 ketones no signs of UTI chest x-ray and CT abdomen pelvis without contrast no acute finding.  Patient was found to be in DKA and was transferred to [facility].  Patient was treated with IV insulin drip with DKA protocol and subsequently transitioned to subcu insulin.  Patient had a severe uncontrolled hypertension antihypertensive regimen was initiated, seen by cardiology for chest pain Patient was admitted she was slowly transition to subcu insulin she had this with nausea pain chest pain seen by cardiology and also had uncontrolled hypertension.  She was started on Coreg and dose was slowly increased anxious additional agent Avapro was added. At this time blood sugar is well controlled tolerating diet ambulating and reports has a walker at home.  She underwent coronary CT scan that was normal.  Discussed with the patient she would like to get discharged today. Discussed w/ cardiology and okay for  discharge home today. I have sent insulin prescription to her pharmacy along with antihypertensive regimen.  Discharge Diagnoses:  DKA in the setting of uncontrolled type 2 diabetes mellitus due to dietary noncompliance: Patient was having a lot of chocolate and drinking a lot of soda at home.  Extensively counseled.  Blood sugar is stabilized and will go home on basal bolus insulin. Uncontrolled  Type 2 diabetes mellitus A1c 12.7.uncontrollled. now stable.  Will be discharged on Premeal NovoLog 5 units 3 times daily and Levemir 20 units daily.  Prescription along with a glucometer insulin pen sent to her pharmacy.  We will hold off on Actos and Trulicity.  Uncontrolled hypertension, she has been started on antihypertensive regimen with irbesartan Coreg and blood pressure is at goal.  She will be discharged home.  AKI -resolved.    Chest pain/mildly positive troponin 40s->20s, positive troponin likely due to demand mismatch from severe metabolic derangement with DKA/ She c/o some chest discomfort, and also endorses exertional chest pain at home, given risk factors cardiology was consulted-and underwent cardiac coronary that was normal, discharge cardiology and okay for discharge home today. Continue Coreg 25 twice daily, irbesartan Lipitor and aspirin as per cardiology.  Echo shows normal EF/hyperdynamic with no regional wall motion abnormalities.   Headache-has chronic headache at home and sometime gets migraine attacks at home and gets "Cocktail' in ED when flares up.cont tylenol.  Hypokalemia-improved.  Obesity with BMI 49: Will benefit with weight loss PCP follow-up   Dyslipidemia: On Lipitor dose increased.  Follow-up with cardiology.  GERD:cont ppi  Gout: On allopurinol.resume  MDD -cont home meds   Consults:  cardio  Subjective: Resting comfortably.  Has been ambulating in the room without any issues.  Would like to go home today. Blood sugar has been  controlled. Discharge Exam: Vitals:   08/04/19 0819 08/04/19 1136  BP: (!) 159/60 115/64  Pulse: 89 65  Resp:  18  Temp:  98.7 F (37.1 C)  SpO2: 99% 100%   General: Pt is alert, awake, not in acute distress Cardiovascular: RRR, S1/S2 +, no rubs, no gallops Respiratory: CTA bilaterally, no wheezing, no rhonchi Abdominal: Soft, NT, ND, bowel sounds + Extremities: no edema, no cyanosis  Discharge Instructions  Discharge Instructions    Ambulatory referral to Nutrition and Diabetic Education   Complete by: As directed    Diet Carb Modified   Complete by: As directed    Discharge instructions   Complete by: As directed    Check blood sugar 3 times a day and bedtime at home. If blood sugar running above 200 less than 70 please call your MD to adjust insulin. If blood sugars running less 100 do not use insulin and call MD. Please avoid any kind of soda products with sugar and chocolate/sweet products as in the past to avoid uncontrolled hyperglycemia. If you noticed signs and symptoms of hypoglycemia or low blood sugar like jitteriness, confusion, thirst, tremor, sweating- Check blood sugar, drink sugary drink/biscuits/sweets to increase sugar level and call MD or return to ER.  Please call call MD or return to ER for similar or worsening recurring problem that brought you to hospital or if any fever,nausea/vomiting,abdominal pain, uncontrolled pain, chest pain,  shortness of breath or any other alarming symptoms.  Please follow-up your doctor as instructed in a week time and call the office for appointment.  Please avoid alcohol, smoking, or any other illicit substance and maintain healthy habits including taking your regular medications as prescribed.  You were cared for by a hospitalist during your hospital stay. If you have any questions about your discharge medications or the care you received while you were in the hospital after you are discharged, you can call the unit and ask  to speak with the hospitalist on call if the hospitalist that took care of you is not available.  Once you are discharged, your primary care physician will handle any further medical issues. Please note that NO REFILLS for any discharge medications will be authorized once you are discharged, as it is imperative that you return to your primary care physician (or establish a relationship with a primary care physician if you do not have one) for your aftercare needs so that they can reassess your need for medications and monitor your lab values   Increase activity slowly   Complete by: As directed      Allergies as of 08/04/2019      Reactions   Peanut Oil Itching      Medication List    STOP taking these medications   pioglitazone 30 MG tablet Commonly known as: ACTOS   triamterene-hydrochlorothiazide 37.5-25 MG tablet Commonly known as: [id number]   Trulicity 4.5 RD/4.0CX Sopn Generic drug: Dulaglutide     TAKE these medications   Accu-Chek Aviva Plus test strip Generic drug: glucose blood 2 (two) times daily. test blood sugar   allopurinol 300 MG tablet Commonly known as: ZYLOPRIM Take 300 mg by mouth daily.   aspirin 81 MG EC tablet Take 1 tablet (  81 mg total) by mouth daily. Start taking on: Aug 05, 2019   atorvastatin 80 MG tablet Commonly known as: LIPITOR Take 1 tablet (80 mg total) by mouth daily. What changed:   medication strength  how much to take   blood glucose meter kit and supplies Dispense based on patient and insurance preference. Use up to four times daily as directed. (FOR ICD-10 E10.9, E11.9).   carvedilol 25 MG tablet Commonly known as: COREG Take 1 tablet (25 mg total) by mouth 2 (two) times daily with a meal.   citalopram 40 MG tablet Commonly known as: CELEXA Take 40 mg by mouth daily.   colchicine 0.6 MG tablet Take 0.6 mg by mouth daily.   fluticasone 50 MCG/ACT nasal spray Commonly known as: FLONASE Place 1 spray into both  nostrils daily.   HYDROcodone-acetaminophen 10-325 MG tablet Commonly known as: NORCO Take 1 tablet by mouth 3 (three) times daily as needed for moderate pain.   irbesartan 150 MG tablet Commonly known as: AVAPRO Take 1 tablet (150 mg total) by mouth daily. Start taking on: Aug 05, 2019   Lantus SoloStar 100 UNIT/ML Solostar Pen Generic drug: insulin glargine Inject 20 Units into the skin at bedtime.   NovoLOG FlexPen 100 UNIT/ML FlexPen Generic drug: insulin aspart Inject 5 Units into the skin 3 (three) times daily with meals.   Onglyza 5 MG Tabs tablet Generic drug: saxagliptin HCl Take 5 mg by mouth daily.   pantoprazole 40 MG tablet Commonly known as: PROTONIX Take 1 tablet (40 mg total) by mouth at bedtime.   Pen Needles 3/16" 31G X 5 MM Misc 1 each by Does not apply route in the morning, at noon, in the evening, and at bedtime.   tiZANidine 4 MG tablet Commonly known as: ZANAFLEX Take 4 mg by mouth 3 (three) times daily.      [facility] Follow up in 1 week(s).   Contact information: [place] [phone] [phone]          Allergies  Allergen Reactions  . Peanut Oil Itching    The results of significant diagnostics from this hospitalization (including imaging, microbiology, ancillary and laboratory) are listed below for reference.    Microbiology: Recent Results (from the past 240 hour(s))  SARS Coronavirus 2 by RT PCR (hospital order, performed in [facility] hospital lab) Nasopharyngeal Nasopharyngeal Swab     Status: None   Collection Time: 07/30/19 12:18 PM   Specimen: Nasopharyngeal Swab  Result Value Ref Range Status   SARS Coronavirus 2 NEGATIVE NEGATIVE Final    Comment: (NOTE) SARS-CoV-2 target nucleic acids are NOT DETECTED. The SARS-CoV-2 RNA is generally detectable in upper and lower respiratory specimens during the acute phase of infection. The lowest concentration of  SARS-CoV-2 viral copies this assay can detect is 250 copies / mL. A negative result does not preclude SARS-CoV-2 infection and should not be used as the sole basis for treatment or other patient management decisions.  A negative result may occur with improper specimen collection / handling, submission of specimen other than nasopharyngeal swab, presence of viral mutation(s) within the areas targeted by this assay, and inadequate number of viral copies (<250 copies / mL). A negative result must be combined with clinical observations, patient history, and epidemiological information. Fact Sheet for Patients:   StrictlyIdeas.no Fact Sheet for Healthcare Providers: BankingDealers.co.za This test is not yet approved or cleared  by the [place] FDA  and has been authorized for detection and/or diagnosis of SARS-CoV-2 by FDA under an Emergency Use Authorization (EUA).  This EUA will remain in effect (meaning this test can be used) for the duration of the COVID-19 declaration under Section 564(b)(1) of the Act, 21 U.S.C. section 360bbb-3(b)(1), unless the authorization is terminated or revoked sooner. Performed at [facility], [address] [place], [place] [address]   MRSA PCR Screening     Status: None   Collection Time: 07/30/19  6:16 PM   Specimen: Nasal Mucosa; Nasopharyngeal  Result Value Ref Range Status   MRSA by PCR NEGATIVE NEGATIVE Final    Comment:        The GeneXpert MRSA Assay (FDA approved for NASAL specimens only), is one component of a comprehensive MRSA colonization surveillance program. It is not intended to diagnose MRSA infection nor to guide or monitor treatment for MRSA infections. Performed at [facility], [place] [address], [place], [place] [address]     Procedures/Studies: CT Abdomen Pelvis Wo Contrast  Result Date: 07/30/2019 CLINICAL DATA:  Nausea and vomiting with  abdominal pain for several days EXAM: CT ABDOMEN AND PELVIS WITHOUT CONTRAST TECHNIQUE: Multidetector CT imaging of the abdomen and pelvis was performed following the standard protocol without IV contrast. COMPARISON:  02/11/2017 FINDINGS: Lower chest: No acute abnormality. Hepatobiliary: No focal liver abnormality is seen. Status post cholecystectomy. No biliary dilatation. Pancreas: Unremarkable. No pancreatic ductal dilatation or surrounding inflammatory changes. Spleen: Normal in size without focal abnormality. Adrenals/Urinary Tract: Adrenal glands are within normal limits. Kidneys are well visualized without renal calculi or obstructive change. Left renal cyst is noted stable from the prior exam. Bladder is within normal limits. Stomach/Bowel: Appendix has been surgically removed. Diverticular change is noted without evidence of diverticulitis. Stomach is distended with fluid. Small bowel is unremarkable. Vascular/Lymphatic: Aortic atherosclerosis. No enlarged abdominal or pelvic lymph nodes. Reproductive: Status post hysterectomy. No adnexal masses. Other: No abdominal wall hernia or abnormality. No abdominopelvic ascites. Musculoskeletal: Degenerative change without acute abnormality. IMPRESSION: Diverticulosis without diverticulitis. No acute abnormality is noted. Electronically Signed   By: Inez Catalina M.D.   On: 07/30/2019 16:05   CT CORONARY MORPH W/CTA COR W/SCORE W/CA W/CM &/OR WO/CM  Result Date: 08/04/2019 CLINICAL DATA:  59 year old female admitted with DKA and chest pain. EXAM: Cardiac/Coronary  CTA TECHNIQUE: The patient was scanned on a [employer]. FINDINGS: A 100 kV prospective scan was triggered in the descending thoracic aorta at 111 HU's. Axial non-contrast 3 mm slices were carried out through the heart. The data set was analyzed on a dedicated work station and scored using the [employer]. Gantry rotation speed was 250 msecs and collimation was .6 mm. 100 mg PO  Metoprolol, 15 mg PO Ivabradine and 0.8 mg of sl NTG was given. The 3D data set was reconstructed in 5% intervals of the 67-82 % of the R-R cycle. Diastolic phases were analyzed on a dedicated work station using MPR, MIP and VRT modes. The patient received 80 cc of contrast. Aorta:  Normal size.  No calcifications.  No dissection. Aortic Valve:  Trileaflet.  No calcifications. Coronary Arteries:  Normal coronary origin.  Right dominance. RCA is a large dominant artery that gives rise to PDA and PLA. There are only luminal irregularities. Left main is a large artery that gives rise to LAD and LCX arteries. Left main has no plaque. LAD is a large vessel that has minimal non-calcified plaque in the proximal  LAD with stenosis 0-25%. LCX is a non-dominant artery that gives rise to one large OM1 branch. There is no plaque. Other findings: Normal pulmonary vein drainage into the left atrium. Normal left atrial appendage without a thrombus. Normal size of the pulmonary artery. IMPRESSION: 1. Coronary calcium score of 0. This was 0 percentile for age and sex matched control. 2. Normal coronary origin with right dominance. 3. CAD-RADS 1. Minimal non-obstructive CAD in the proximal LAD (0-24%). Consider non-atherosclerotic causes of chest pain. Consider preventive therapy and risk factor modification. Electronically Signed   By: Ena Dawley   On: 08/04/2019 12:54   DG Chest Portable 1 View  Result Date: 07/30/2019 CLINICAL DATA:  Chest pain, shortness of breath. EXAM: PORTABLE CHEST 1 VIEW COMPARISON:  September 21, 2014. FINDINGS: The heart size and mediastinal contours are within normal limits. Both lungs are clear. No pneumothorax or pleural effusion is noted. The visualized skeletal structures are unremarkable. IMPRESSION: No active disease. Electronically Signed   By: Marijo Conception M.D.   On: 07/30/2019 12:09   ECHOCARDIOGRAM COMPLETE  Result Date: 08/01/2019    ECHOCARDIOGRAM REPORT   Patient Name:   Tracey Castillo Date of Exam: 08/01/2019 Medical Rec #:  [id number]         Height:       66.0 in Accession #:    [id number]        Weight:       309.0 lb Date of Birth:  09-May-1960         BSA:          2.406 m Patient Age:    65 years          BP:           163/68 mmHg Patient Gender: F                 HR:           79 bpm. Exam Location:  Inpatient Procedure: 2D Echo and Intracardiac Opacification Agent Indications:    Chest Pain 786.50 / R07.9  History:        Patient has no prior history of Echocardiogram examinations.                 Risk Factors:Dyslipidemia and Obesity. AKI (acute kidney                 injury).  Sonographer:    Vikki Ports Turrentine Referring Phys: [id number] Dorothy Spark  Sonographer Comments: Suboptimal apical window and suboptimal subcostal window. Image acquisition challenging due to patient body habitus. IMPRESSIONS  1. Left ventricular ejection fraction, by estimation, is 70 to 75%. The left ventricle has hyperdynamic function, with midcavitary obstruction, maximum instantaneous gradient 29 mmHg (velocity 2.7 m/s) . The left ventricle has no regional wall motion abnormalities. Left ventricular diastolic parameters are consistent with Grade I diastolic dysfunction (impaired relaxation).  2. Right ventricular systolic function is normal. The right ventricular size is normal.  3. The mitral valve is normal in structure. No evidence of mitral valve regurgitation. No evidence of mitral stenosis.  4. The aortic valve is tricuspid. Aortic valve regurgitation is not visualized. No aortic stenosis is present. Conclusion(s)/Recommendation(s): Normal coronary artery origins. FINDINGS  Left Ventricle: Left ventricular ejection fraction, by estimation, is 70 to 75%. The left ventricle has hyperdynamic function. The left ventricle has no regional wall motion abnormalities. Definity contrast agent was given IV to delineate the left ventricular endocardial borders. The left ventricular  internal cavity size  was normal in size. There is no left ventricular hypertrophy. Left ventricular diastolic parameters are consistent with Grade I diastolic dysfunction (impaired relaxation). Right Ventricle: The right ventricular size is normal. No increase in right ventricular wall thickness. Right ventricular systolic function is normal. Left Atrium: Left atrial size was normal in size. Right Atrium: Right atrial size was normal in size. Pericardium: There is no evidence of pericardial effusion. Mitral Valve: The mitral valve is normal in structure. Normal mobility of the mitral valve leaflets. No evidence of mitral valve regurgitation. No evidence of mitral valve stenosis. Tricuspid Valve: The tricuspid valve is normal in structure. Tricuspid valve regurgitation is trivial. No evidence of tricuspid stenosis. Aortic Valve: The aortic valve is tricuspid. Aortic valve regurgitation is not visualized. No aortic stenosis is present. Aortic valve mean gradient measures 9.0 mmHg. Aortic valve peak gradient measures 15.4 mmHg. Aortic valve area, by VTI measures 1.67  cm. Pulmonic Valve: The pulmonic valve was normal in structure. Pulmonic valve regurgitation is trivial. No evidence of pulmonic stenosis. Aorta: The aortic root is normal in size and structure. Venous: The inferior vena cava was not well visualized. IAS/Shunts: The interatrial septum was not well visualized.  LEFT VENTRICLE PLAX 2D LVIDd:         3.90 cm  Diastology LVIDs:         2.40 cm  LV e' lateral:   7.07 cm/s LV PW:         1.00 cm  LV E/e' lateral: 13.3 LV IVS:        1.00 cm  LV e' medial:    6.53 cm/s LVOT diam:     1.80 cm  LV E/e' medial:  14.4 LV SV:         56 LV SV Index:   23 LVOT Area:     2.54 cm  LEFT ATRIUM           Index       RIGHT ATRIUM           Index LA diam:      3.40 cm 1.41 cm/m  RA Area:     14.10 cm LA Vol (A4C): 34.1 ml 14.17 ml/m RA Volume:   37.90 ml  15.75 ml/m  AORTIC VALVE AV Area (Vmax):    1.42 cm AV Area (Vmean):   1.46 cm AV  Area (VTI):     1.67 cm AV Vmax:           196.00 cm/s AV Vmean:          140.000 cm/s AV VTI:            0.338 m AV Peak Grad:      15.4 mmHg AV Mean Grad:      9.0 mmHg LVOT Vmax:         109.00 cm/s LVOT Vmean:        80.300 cm/s LVOT VTI:          0.222 m LVOT/AV VTI ratio: 0.66  AORTA Ao Root diam: 2.30 cm MITRAL VALVE MV Area (PHT): 3.74 cm     SHUNTS MV Decel Time: 203 msec     Systemic VTI:  0.22 m MV E velocity: 93.80 cm/s   Systemic Diam: 1.80 cm MV A velocity: 102.00 cm/s MV E/A ratio:  0.92 Cherlynn Kaiser MD Electronically signed by Cherlynn Kaiser MD Signature Date/Time: 08/01/2019/10:08:54 PM    Final    [place] EKG SITE RITE  Result  Date: 07/31/2019 If Site Rite image not attached, placement could not be confirmed due to current cardiac rhythm.   Labs: BNP (last 3 results) No results for input(s): BNP in the last 8760 hours. Basic Metabolic Panel: Recent Labs  Lab 07/30/19 2011 07/30/19 2203 07/31/19 1631 08/01/19 0622 08/02/19 0439 08/03/19 0301 08/04/19 0811  NA 137   < > 137 135 137 137 141  K 3.7   < > 3.8 4.3 4.0 4.0 3.7  CL 103   < > 105 105 109 107 109  CO2 18*   < > 20* 20* 19* 20* 21*  GLUCOSE 392*   < > 230* 247* 198* 239* 190*  BUN 14   < > _0 CREATININE 1.20*   < > 1.07* 0.81 0.74 0.75 0.71  CALCIUM 8.6*   < > 8.4* 8.1* 8.4* 8.5* 8.7*  MG 1.7  --   --   --   --   --   --    < > = values in this interval not displayed.   Liver Function Tests: Recent Labs  Lab 07/30/19 1203 07/31/19 0612 08/02/19 0439 08/03/19 0301 08/04/19 0811  AST 48* 38 30 31 67*  ALT 78* 57* 35 33 42  ALKPHOS 219* 137* 113 116 118  BILITOT 1.2 0.8 0.7 0.5 0.5  PROT 9.8* 8.3* 6.6 6.6 6.9  ALBUMIN 4.0 3.4* 2.6* 2.8* 3.0*   Recent Labs  Lab 07/30/19 2011  LIPASE 29   No results for input(s): AMMONIA in the last 168 hours. CBC: Recent Labs  Lab 07/30/19 1203 07/30/19 1210 07/31/19 0242 08/02/19 0439  WBC 13.6*  --  13.0* 6.5  NEUTROABS 11.7*  --   --   --    HGB 14.7 16.3* 12.8 11.1*  HCT 44.4 48.0* 40.3 34.5*  MCV 85.1  --  88.0 88.5  PLT 442*  --  374 276   Cardiac Enzymes: No results for input(s): CKTOTAL, CKMB, CKMBINDEX, TROPONINI in the last 168 hours. BNP: Invalid input(s): POCBNP CBG: Recent Labs  Lab 08/03/19 1148 08/03/19 1641 08/03/19 2105 08/04/19 0741 08/04/19 1150  GLUCAP 134* 221* 169* 141* 145*   D-Dimer No results for input(s): DDIMER in the last 72 hours. Hgb A1c No results for input(s): HGBA1C in the last 72 hours. Lipid Profile No results for input(s): CHOL, HDL, LDLCALC, TRIG, CHOLHDL, LDLDIRECT in the last 72 hours. Thyroid function studies No results for input(s): TSH, T4TOTAL, T3FREE, THYROIDAB in the last 72 hours.  Invalid input(s): FREET3 Anemia work up No results for input(s): VITAMINB12, FOLATE, FERRITIN, TIBC, IRON, RETICCTPCT in the last 72 hours. Urinalysis    Component Value Date/Time   COLORURINE YELLOW 07/30/2019 1445   APPEARANCEUR CLEAR 07/30/2019 1445   LABSPEC 1.025 07/30/2019 1445   PHURINE 5.0 07/30/2019 1445   GLUCOSEU >=500 (A) 07/30/2019 1445   HGBUR NEGATIVE 07/30/2019 1445   BILIRUBINUR SMALL (A) 07/30/2019 1445   KETONESUR >80 (A) 07/30/2019 1445   PROTEINUR NEGATIVE 07/30/2019 1445   NITRITE NEGATIVE 07/30/2019 1445   LEUKOCYTESUR NEGATIVE 07/30/2019 1445   Sepsis Labs Invalid input(s): PROCALCITONIN,  WBC,  LACTICIDVEN Microbiology Recent Results (from the past 240 hour(s))  SARS Coronavirus 2 by RT PCR (hospital order, performed in [place] hospital lab) Nasopharyngeal Nasopharyngeal Swab     Status: None   Collection Time: 07/30/19 12:18 PM   Specimen: Nasopharyngeal Swab  Result Value Ref Range Status   SARS Coronavirus 2 NEGATIVE NEGATIVE Final  Comment: (NOTE) SARS-CoV-2 target nucleic acids are NOT DETECTED. The SARS-CoV-2 RNA is generally detectable in upper and lower respiratory specimens during the acute phase of infection. The lowest concentration  of SARS-CoV-2 viral copies this assay can detect is 250 copies / mL. A negative result does not preclude SARS-CoV-2 infection and should not be used as the sole basis for treatment or other patient management decisions.  A negative result may occur with improper specimen collection / handling, submission of specimen other than nasopharyngeal swab, presence of viral mutation(s) within the areas targeted by this assay, and inadequate number of viral copies (<250 copies / mL). A negative result must be combined with clinical observations, patient history, and epidemiological information. Fact Sheet for Patients:   StrictlyIdeas.no Fact Sheet for Healthcare Providers: BankingDealers.co.za This test is not yet approved or cleared  by the [place] FDA and has been authorized for detection and/or diagnosis of SARS-CoV-2 by FDA under an Emergency Use Authorization (EUA).  This EUA will remain in effect (meaning this test can be used) for the duration of the COVID-19 declaration under Section 564(b)(1) of the Act, 21 U.S.C. section 360bbb-3(b)(1), unless the authorization is terminated or revoked sooner. Performed at [facility], [address] [place], [place] [address]   MRSA PCR Screening     Status: None   Collection Time: 07/30/19  6:16 PM   Specimen: Nasal Mucosa; Nasopharyngeal  Result Value Ref Range Status   MRSA by PCR NEGATIVE NEGATIVE Final    Comment:        The GeneXpert MRSA Assay (FDA approved for NASAL specimens only), is one component of a comprehensive MRSA colonization surveillance program. It is not intended to diagnose MRSA infection nor to guide or monitor treatment for MRSA infections. Performed at [facility], [place] [address], [place], [place] [address]      Time coordinating discharge: 35 minutes  SIGNED: Antonieta Pert, MD  [facility] 08/04/2019, 1:44 PM  If  7PM-7AM, please contact night-coverage [email or web address]

## 2019-08-04 NOTE — Progress Notes (Signed)
Progress Note  Patient Name: Tracey Castillo Date of Encounter: 08/04/2019  Primary Cardiologist: New   Subjective   The patient had mild pleuritic type of chest pain with deep inspiration, no SOB.   Inpatient Medications    Scheduled Meds: . aspirin EC  81 mg Oral Daily  . atorvastatin  80 mg Oral Daily  . carvedilol  25 mg Oral BID WC  . enoxaparin (LOVENOX) injection  70 mg Subcutaneous Q24H  . insulin aspart  0-15 Units Subcutaneous TID WC  . insulin aspart  5 Units Subcutaneous TID WC  . insulin detemir  20 Units Subcutaneous Daily  . irbesartan  150 mg Oral Daily  . loratadine  10 mg Oral Daily  . metoCLOPramide (REGLAN) injection  5 mg Intravenous Q6H  . metoprolol tartrate  100 mg Oral Once  . pantoprazole  40 mg Oral QHS  . sodium chloride flush  10-40 mL Intracatheter Q12H   Continuous Infusions:  PRN Meds: acetaminophen, alum & mag hydroxide-simeth, dextrose, hydrALAZINE, labetalol, morphine injection, ondansetron (ZOFRAN) IV, sodium chloride, sodium chloride flush   Vital Signs    Vitals:   08/03/19 2103 08/04/19 0152 08/04/19 0528 08/04/19 0819  BP: (!) 119/51 140/72 (!) 118/58 (!) 159/60  Pulse: 85 88 94 89  Resp: 18 18 18    Temp: 98.2 F (36.8 C) 98 F (36.7 C) 98.4 F (36.9 C)   TempSrc: Oral Oral Oral   SpO2: 99% 98% 99% 99%  Weight:      Height:        Intake/Output Summary (Last 24 hours) at 08/04/2019 0840 Last data filed at 08/03/2019 0900 Gross per 24 hour  Intake 691.35 ml  Output --  Net 691.35 ml   Filed Weights   07/30/19 1132  Weight: (!) 140.2 kg    Physical Exam   General: Obese, NAD Skin: Warm, dry, intact  Lungs:Clear to ausculation bilaterally. Breathing is unlabored. Cardiovascular: RRR with S1 S2. No murmurs Abdomen: Soft, non-tender, non-distended. No obvious abdominal masses. Extremities: No edema. Radial pulses 2+ bilaterally Neuro: Alert and oriented. No focal deficits. No facial asymmetry. MAE  spontaneously. Psych: Responds to questions appropriately with normal affect.    Labs    Chemistry Recent Labs  Lab 07/31/19 0612 07/31/19 1027 08/01/19 0622 08/02/19 0439 08/03/19 0301  NA 138   < > 135 137 137  K 3.4*   < > 4.3 4.0 4.0  CL 104   < > 105 109 107  CO2 24   < > 20* 19* 20*  GLUCOSE 209*   < > 247* 198* 239*  BUN 14   < > 10 11 11   CREATININE 1.17*   < > 0.81 0.74 0.75  CALCIUM 8.5*   < > 8.1* 8.4* 8.5*  PROT 8.3*  --   --  6.6 6.6  ALBUMIN 3.4*  --   --  2.6* 2.8*  AST 38  --   --  30 31  ALT 57*  --   --  35 33  ALKPHOS 137*  --   --  113 116  BILITOT 0.8  --   --  0.7 0.5  GFRNONAA 52*   < > >60 >60 >60  GFRAA 60*   < > >60 >60 >60  ANIONGAP 10   < > 10 9 10    < > = values in this interval not displayed.     Hematology Recent Labs  Lab 07/30/19 1203 07/30/19 1203 07/30/19 1210  07/31/19 0242 08/02/19 0439  WBC 13.6*  --   --  13.0* 6.5  RBC 5.22*  --   --  4.58 3.90  HGB 14.7   < > 16.3* 12.8 11.1*  HCT 44.4   < > 48.0* 40.3 34.5*  MCV 85.1  --   --  88.0 88.5  MCH 28.2  --   --  27.9 28.5  MCHC 33.1  --   --  31.8 32.2  RDW 14.8  --   --  15.0 15.3  PLT 442*  --   --  374 276   < > = values in this interval not displayed.    Cardiac EnzymesNo results for input(s): TROPONINI in the last 168 hours. No results for input(s): TROPIPOC in the last 168 hours.   BNPNo results for input(s): BNP, PROBNP in the last 168 hours.   DDimer No results for input(s): DDIMER in the last 168 hours.   Radiology     Telemetry    08/02/19 NSR 90's - Personally Reviewed  ECG    No new tracing as of 08/02/19- Personally Reviewed  Cardiac Studies   Echo 08/01/19:  1. Left ventricular ejection fraction, by estimation, is 70 to 75%. The  left ventricle has hyperdynamic function, with midcavitary obstruction,  maximum instantaneous gradient 29 mmHg (velocity 2.7 m/s) . The left  ventricle has no regional wall motion  abnormalities. Left ventricular  diastolic parameters are consistent with  Grade I diastolic dysfunction (impaired relaxation).  2. Right ventricular systolic function is normal. The right ventricular  size is normal.  3. The mitral valve is normal in structure. No evidence of mitral valve  regurgitation. No evidence of mitral stenosis.  4. The aortic valve is tricuspid. Aortic valve regurgitation is not  visualized. No aortic stenosis is present.   Patient Profile     59 y.o. female with a hx of type 2 diabetes, gout hypertension hyperlipidemia who is being seen today for the evaluation of elevated troponin at the request of Lanae Boast, MD.  Assessment & Plan    1. Elevated hsT with chest pain: -Likely reflects demand ischemia in the setting of DKA -hsT, 36>45>22 -EKG remains WNL with no acute changes however givern her most recent symptoms of what sounds like exertional chest pain>>plan is for CCTA when stable from DKA.  -Carvedilol increased to 12.5mg  08/01/19>>>would favor increasing further to 25mg  PO BID  -Also started on ASA 81 and atorvastatin 80 -Plan for CCTA - mild chest pressure on and off - ventricular rates still in 90s  - carvedilol was increased to 25 mg p.o. twice daily.  - start irbesartan 75 mg daily.   - Echocardiogram showed hyperdynamic LVEF over 70% with mid cavitary obstruction with maximum instantaneous gradient of 30 mmHg, therefore she will definitely benefit from good beta-blockade.  She has no significant valvular abnormalities. - I will plan for a CCTA tomorrow with ivabradine 15 mg po x 1 in the morning for HR control, HR still in 90', we will give additional 100 mg PO Metoprolol.  2. HTN: -Uncontrolled on admission 202/64, now at goal < 130/70 mmHg -Continue carvedilol 25mg  pO BID, irbesartan 150 mg po daily -IVF stopped per IM -Creatinine stable at 0.74  3. HLD: -Started on atorvastatin 80mg  PO QD 08/01/19 -Lipid panel  -Needs f/u lipid and LFTs in 6-8 weeks   Other hospital  issues per primary team include: -DKA  , MD 08/04/2019'

## 2019-09-15 ENCOUNTER — Other Ambulatory Visit: Payer: Self-pay

## 2019-09-15 ENCOUNTER — Emergency Department (HOSPITAL_BASED_OUTPATIENT_CLINIC_OR_DEPARTMENT_OTHER): Payer: Medicaid Other

## 2019-09-15 ENCOUNTER — Encounter (HOSPITAL_BASED_OUTPATIENT_CLINIC_OR_DEPARTMENT_OTHER): Payer: Self-pay | Admitting: Emergency Medicine

## 2019-09-15 ENCOUNTER — Emergency Department (HOSPITAL_BASED_OUTPATIENT_CLINIC_OR_DEPARTMENT_OTHER)
Admission: EM | Admit: 2019-09-15 | Discharge: 2019-09-15 | Disposition: A | Discharge status: Home / Self Care | Payer: Medicaid Other | Attending: Emergency Medicine | Admitting: Emergency Medicine

## 2019-09-15 DIAGNOSIS — Y999 Unspecified external cause status: Secondary | ICD-10-CM | POA: Diagnosis not present

## 2019-09-15 DIAGNOSIS — Z91010 Allergy to peanuts: Secondary | ICD-10-CM | POA: Insufficient documentation

## 2019-09-15 DIAGNOSIS — I10 Essential (primary) hypertension: Secondary | ICD-10-CM | POA: Insufficient documentation

## 2019-09-15 DIAGNOSIS — Y939 Activity, unspecified: Secondary | ICD-10-CM | POA: Diagnosis not present

## 2019-09-15 DIAGNOSIS — Z794 Long term (current) use of insulin: Secondary | ICD-10-CM | POA: Insufficient documentation

## 2019-09-15 DIAGNOSIS — S161XXA Strain of muscle, fascia and tendon at neck level, initial encounter: Secondary | ICD-10-CM | POA: Diagnosis not present

## 2019-09-15 DIAGNOSIS — Z79899 Other long term (current) drug therapy: Secondary | ICD-10-CM | POA: Diagnosis not present

## 2019-09-15 DIAGNOSIS — R519 Headache, unspecified: Secondary | ICD-10-CM | POA: Insufficient documentation

## 2019-09-15 DIAGNOSIS — E1110 Type 2 diabetes mellitus with ketoacidosis without coma: Secondary | ICD-10-CM | POA: Insufficient documentation

## 2019-09-15 DIAGNOSIS — Y92410 Unspecified street and highway as the place of occurrence of the external cause: Secondary | ICD-10-CM | POA: Diagnosis not present

## 2019-09-15 DIAGNOSIS — S199XXA Unspecified injury of neck, initial encounter: Secondary | ICD-10-CM | POA: Diagnosis present

## 2019-09-15 MED ORDER — KETOROLAC TROMETHAMINE 60 MG/2ML IM SOLN
60.0000 mg | Freq: Once | INTRAMUSCULAR | Status: AC
  Administered 2019-09-15: 60 mg via INTRAMUSCULAR
  Filled 2019-09-15: qty 2

## 2019-09-15 MED ORDER — CYCLOBENZAPRINE HCL 10 MG PO TABS
10.0000 mg | ORAL_TABLET | Freq: Two times a day (BID) | ORAL | 0 refills | Status: DC | PRN
Start: 2019-09-15 — End: 2022-01-05
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
## 2020-06-13 IMAGING — CT CT HEART MORP W/ CTA COR W/ SCORE W/ CA W/CM &/OR W/O CM
4 of 7 series · 8 of 20 positions shown, 9 images · non-contrast
Comparison: CT of the chest on 02/11/2017

Addendum:
CLINICAL DATA: 58-year-old female admitted with DKA and chest pain.

EXAM:
Cardiac/Coronary  CTA
TECHNIQUE: The patient was scanned on a [employer] Force scanner.

[Series 7: best diast 71 % · axial · 0.39mm/px · z∈[-232,-195]mm · 2 of 281 slices shown]
[im 94/281  vessel]
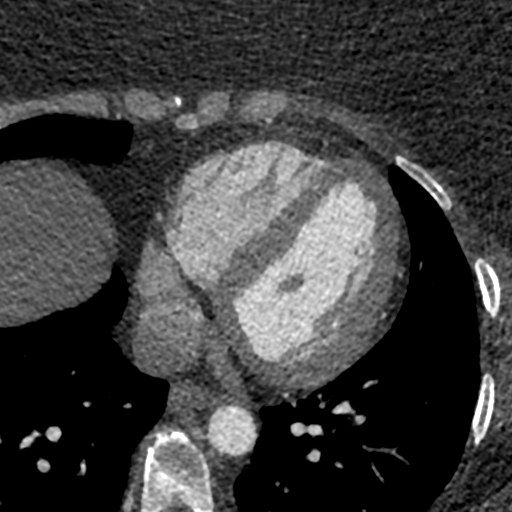
[im 187/281  vessel]
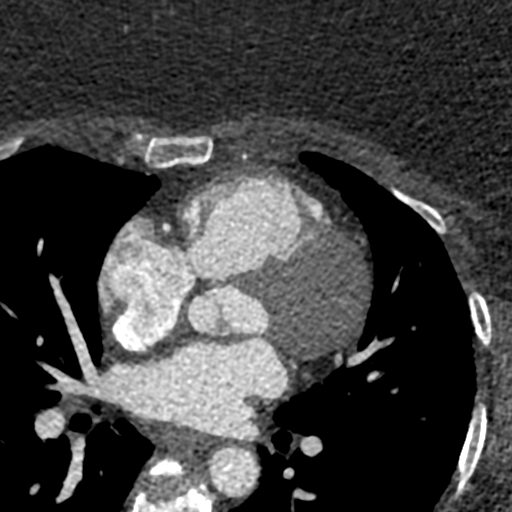

[Series 8: best syst · axial · 0.39mm/px · z∈[-232,-195]mm · 2 of 281 slices shown, 3 images]
[im 94/281  vessel]
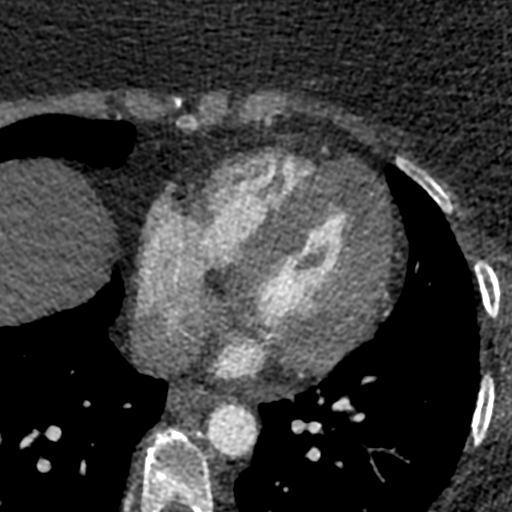
[im 94/281  lung]
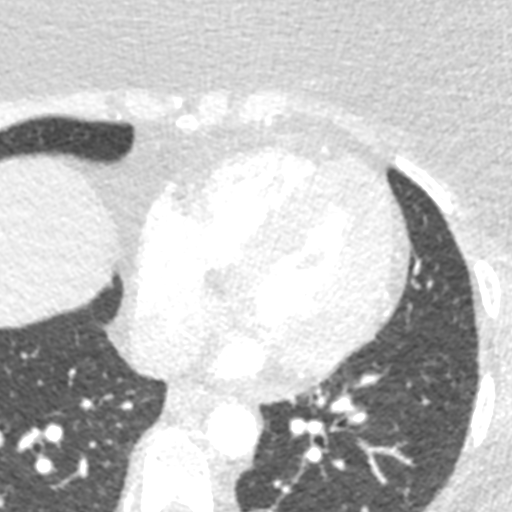
[im 187/281  vessel]
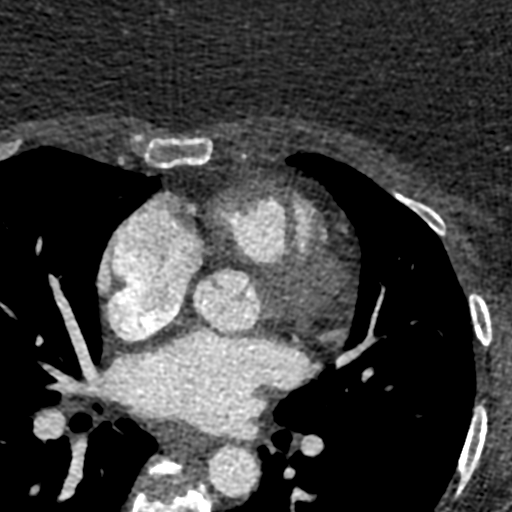

[Series 9: ts diast sharp 71 % · axial · 0.39mm/px · z∈[-232,-195]mm · 2 of 281 slices shown]
[im 94/281  lung]
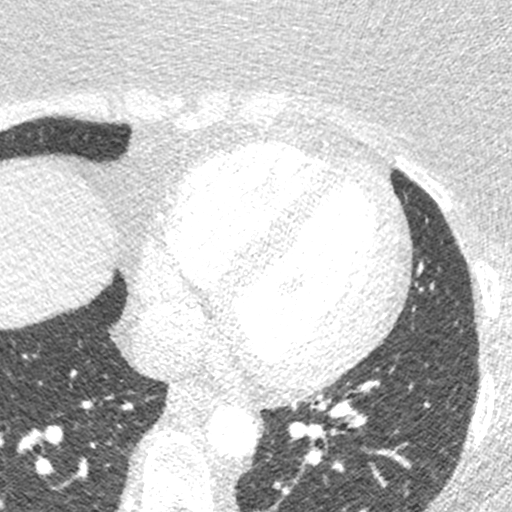
[im 187/281  lung]
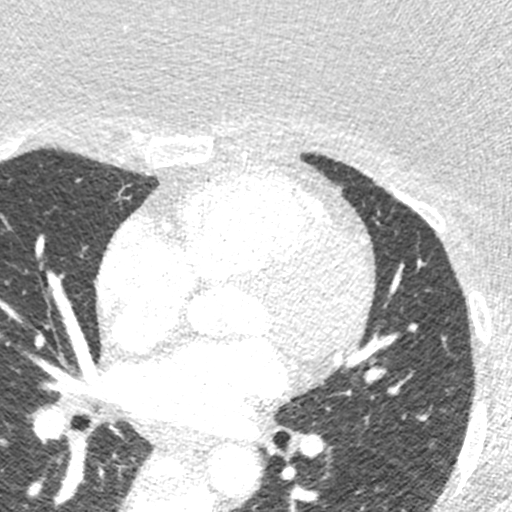

[Series 10: ts syst sharp · axial · 0.39mm/px · z∈[-232,-195]mm · 2 of 281 slices shown]
[im 94/281  lung]
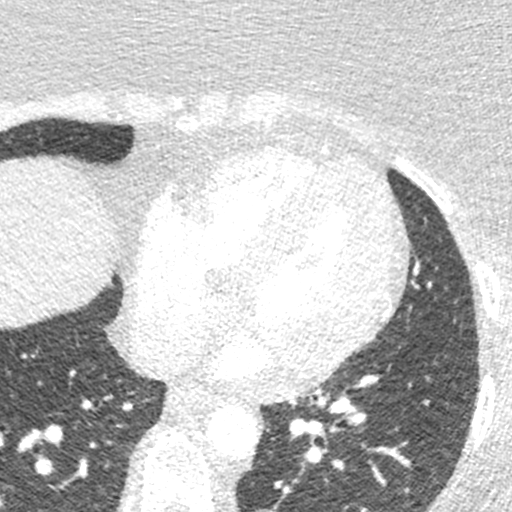
[im 187/281  lung]
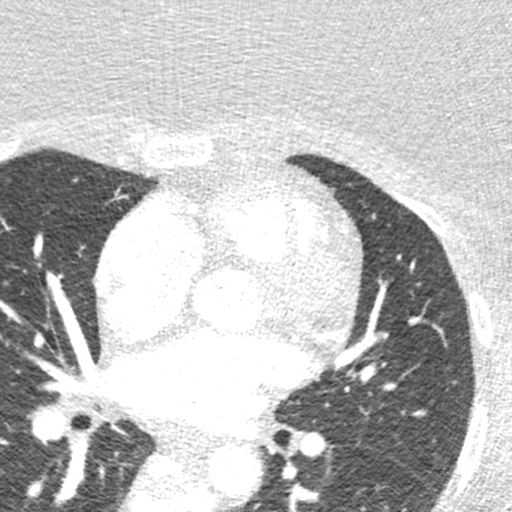

[8 of 20 positions shown; findings below may reference images not displayed]

FINDINGS: A 100 kV prospective scan was triggered in the descending thoracic
aorta at 111 HU's. Axial non-contrast 3 mm slices were carried out
through the heart. The data set was analyzed on a dedicated work
station and scored using the Agatson method. Gantry rotation speed
was 250 msecs and collimation was .6 mm. 100 mg PO Metoprolol, 15 mg
PO Ivabradine and 0.8 mg of sl NTG was given. The 3D data set was
reconstructed in 5% intervals of the 67-82 % of the R-R cycle.
Diastolic phases were analyzed on a dedicated work station using
MPR, MIP and VRT modes. The patient received 80 cc of contrast.

Aorta:  Normal size.  No calcifications.  No dissection.

Aortic Valve:  Trileaflet.  No calcifications.

Coronary Arteries:  Normal coronary origin.  Right dominance.

RCA is a large dominant artery that gives rise to PDA and PLA. There
are only luminal irregularities.

Left main is a large artery that gives rise to LAD and LCX arteries.
Left main has no plaque.

LAD is a large vessel that has minimal non-calcified plaque in the
proximal LAD with stenosis 0-25%.

LCX is a non-dominant artery that gives rise to one large OM1
branch. There is no plaque.

Other findings:

Normal pulmonary vein drainage into the left atrium.

Normal left atrial appendage without a thrombus.

Normal size of the pulmonary artery.
IMPRESSION: 1. Coronary calcium score of 0. This was 0 percentile for age and
sex matched control.

2. Normal coronary origin with right dominance.

3. CAD-RADS 1. Minimal non-obstructive CAD in the proximal LAD
(0-24%). Consider non-atherosclerotic causes of chest pain. Consider
preventive therapy and risk factor modification.

EXAM:
OVER-READ INTERPRETATION  CT CHEST

The following report is an over-read performed by radiologist Dr.
over-read does not include interpretation of cardiac or coronary
anatomy or pathology. The coronary CTA interpretation by the
cardiologist is attached.
FINDINGS: No incidental vascular findings. On the upper cuts of the study lung
windows demonstrate some patchy airspace opacity in the right
perihilar lung. This is nonspecific but may reflect inflammatory or
infectious process. This was not present on the prior CT of the
chest in 0602. No edema, pleural fluid, pneumothorax or nodule
identified in the visualized lungs. Visualized upper abdomen and
bony structures are unremarkable.
IMPRESSION: Patchy airspace opacity in the right perihilar lung which is
incompletely visualized on the coronary CTA. This is nonspecific but
may reflect inflammatory or infectious process.

*** End of Addendum ***
FINDINGS: A 100 kV prospective scan was triggered in the descending thoracic
aorta at 111 HU's. Axial non-contrast 3 mm slices were carried out
through the heart. The data set was analyzed on a dedicated work
station and scored using the Agatson method. Gantry rotation speed
was 250 msecs and collimation was .6 mm. 100 mg PO Metoprolol, 15 mg
PO Ivabradine and 0.8 mg of sl NTG was given. The 3D data set was
reconstructed in 5% intervals of the 67-82 % of the R-R cycle.
Diastolic phases were analyzed on a dedicated work station using
MPR, MIP and VRT modes. The patient received 80 cc of contrast.

Aorta:  Normal size.  No calcifications.  No dissection.

Aortic Valve:  Trileaflet.  No calcifications.

Coronary Arteries:  Normal coronary origin.  Right dominance.

RCA is a large dominant artery that gives rise to PDA and PLA. There
are only luminal irregularities.

Left main is a large artery that gives rise to LAD and LCX arteries.
Left main has no plaque.

LAD is a large vessel that has minimal non-calcified plaque in the
proximal LAD with stenosis 0-25%.

LCX is a non-dominant artery that gives rise to one large OM1
branch. There is no plaque.

Other findings:

Normal pulmonary vein drainage into the left atrium.

Normal left atrial appendage without a thrombus.

Normal size of the pulmonary artery.
IMPRESSION: 1. Coronary calcium score of 0. This was 0 percentile for age and
sex matched control.

2. Normal coronary origin with right dominance.

3. CAD-RADS 1. Minimal non-obstructive CAD in the proximal LAD
(0-24%). Consider non-atherosclerotic causes of chest pain. Consider
preventive therapy and risk factor modification.

## 2020-10-19 ENCOUNTER — Other Ambulatory Visit: Payer: Self-pay | Admitting: Anesthesiology

## 2020-10-19 DIAGNOSIS — M545 Low back pain, unspecified: Secondary | ICD-10-CM

## 2020-11-04 ENCOUNTER — Other Ambulatory Visit: Payer: Medicaid Other

## 2022-01-04 ENCOUNTER — Encounter (HOSPITAL_BASED_OUTPATIENT_CLINIC_OR_DEPARTMENT_OTHER): Payer: Self-pay

## 2022-01-04 ENCOUNTER — Other Ambulatory Visit: Payer: Self-pay

## 2022-01-04 ENCOUNTER — Emergency Department (HOSPITAL_BASED_OUTPATIENT_CLINIC_OR_DEPARTMENT_OTHER): Payer: Medicaid Other

## 2022-01-04 DIAGNOSIS — K5730 Diverticulosis of large intestine without perforation or abscess without bleeding: Secondary | ICD-10-CM | POA: Diagnosis present

## 2022-01-04 DIAGNOSIS — Z6841 Body Mass Index (BMI) 40.0 and over, adult: Secondary | ICD-10-CM

## 2022-01-04 DIAGNOSIS — E872 Acidosis, unspecified: Secondary | ICD-10-CM | POA: Diagnosis present

## 2022-01-04 DIAGNOSIS — Z90710 Acquired absence of both cervix and uterus: Secondary | ICD-10-CM

## 2022-01-04 DIAGNOSIS — E7800 Pure hypercholesterolemia, unspecified: Secondary | ICD-10-CM | POA: Diagnosis present

## 2022-01-04 DIAGNOSIS — K2900 Acute gastritis without bleeding: Principal | ICD-10-CM | POA: Diagnosis present

## 2022-01-04 DIAGNOSIS — F419 Anxiety disorder, unspecified: Secondary | ICD-10-CM | POA: Diagnosis present

## 2022-01-04 DIAGNOSIS — R6510 Systemic inflammatory response syndrome (SIRS) of non-infectious origin without acute organ dysfunction: Secondary | ICD-10-CM | POA: Diagnosis present

## 2022-01-04 DIAGNOSIS — R81 Glycosuria: Secondary | ICD-10-CM | POA: Diagnosis present

## 2022-01-04 DIAGNOSIS — Z9049 Acquired absence of other specified parts of digestive tract: Secondary | ICD-10-CM

## 2022-01-04 DIAGNOSIS — R824 Acetonuria: Secondary | ICD-10-CM | POA: Diagnosis present

## 2022-01-04 DIAGNOSIS — Z1152 Encounter for screening for COVID-19: Secondary | ICD-10-CM

## 2022-01-04 DIAGNOSIS — N179 Acute kidney failure, unspecified: Secondary | ICD-10-CM | POA: Diagnosis present

## 2022-01-04 DIAGNOSIS — M109 Gout, unspecified: Secondary | ICD-10-CM | POA: Diagnosis present

## 2022-01-04 DIAGNOSIS — E119 Type 2 diabetes mellitus without complications: Secondary | ICD-10-CM | POA: Diagnosis present

## 2022-01-04 DIAGNOSIS — K5900 Constipation, unspecified: Secondary | ICD-10-CM | POA: Diagnosis present

## 2022-01-04 DIAGNOSIS — Z79899 Other long term (current) drug therapy: Secondary | ICD-10-CM

## 2022-01-04 DIAGNOSIS — K219 Gastro-esophageal reflux disease without esophagitis: Secondary | ICD-10-CM | POA: Diagnosis present

## 2022-01-04 DIAGNOSIS — R109 Unspecified abdominal pain: Secondary | ICD-10-CM | POA: Diagnosis not present

## 2022-01-04 DIAGNOSIS — R7401 Elevation of levels of liver transaminase levels: Secondary | ICD-10-CM | POA: Diagnosis present

## 2022-01-04 DIAGNOSIS — I10 Essential (primary) hypertension: Secondary | ICD-10-CM | POA: Diagnosis present

## 2022-01-04 DIAGNOSIS — Z794 Long term (current) use of insulin: Secondary | ICD-10-CM

## 2022-01-04 DIAGNOSIS — R112 Nausea with vomiting, unspecified: Secondary | ICD-10-CM | POA: Diagnosis present

## 2022-01-04 DIAGNOSIS — R7989 Other specified abnormal findings of blood chemistry: Secondary | ICD-10-CM | POA: Diagnosis present

## 2022-01-04 DIAGNOSIS — E860 Dehydration: Secondary | ICD-10-CM | POA: Diagnosis present

## 2022-01-04 DIAGNOSIS — F329 Major depressive disorder, single episode, unspecified: Secondary | ICD-10-CM | POA: Diagnosis present

## 2022-01-04 NOTE — ED Triage Notes (Signed)
Vomiting x1 week, unable to eat/drink without food coming right back up. Pt reports fever (subjective) at home. Pt reports worsening SOB over past week. Pt reports epigastric pain that is constant aching that worsens when vomiting. Pt endorses weakness/fatigue.

## 2022-01-05 ENCOUNTER — Inpatient Hospital Stay (HOSPITAL_BASED_OUTPATIENT_CLINIC_OR_DEPARTMENT_OTHER)
Admission: EM | Admit: 2022-01-05 | Discharge: 2022-01-10 | DRG: 392 | Disposition: A | Discharge status: Home / Self Care | Payer: Medicaid Other | Attending: Internal Medicine | Admitting: Internal Medicine

## 2022-01-05 ENCOUNTER — Emergency Department (HOSPITAL_BASED_OUTPATIENT_CLINIC_OR_DEPARTMENT_OTHER): Payer: Medicaid Other

## 2022-01-05 ENCOUNTER — Encounter (HOSPITAL_COMMUNITY): Payer: Self-pay

## 2022-01-05 ENCOUNTER — Encounter (HOSPITAL_COMMUNITY): Payer: Self-pay | Admitting: Internal Medicine

## 2022-01-05 DIAGNOSIS — I10 Essential (primary) hypertension: Secondary | ICD-10-CM | POA: Diagnosis not present

## 2022-01-05 DIAGNOSIS — Z794 Long term (current) use of insulin: Secondary | ICD-10-CM | POA: Diagnosis not present

## 2022-01-05 DIAGNOSIS — F419 Anxiety disorder, unspecified: Secondary | ICD-10-CM | POA: Diagnosis not present

## 2022-01-05 DIAGNOSIS — R000 Tachycardia, unspecified: Secondary | ICD-10-CM | POA: Diagnosis present

## 2022-01-05 DIAGNOSIS — R6510 Systemic inflammatory response syndrome (SIRS) of non-infectious origin without acute organ dysfunction: Secondary | ICD-10-CM | POA: Diagnosis not present

## 2022-01-05 DIAGNOSIS — Z6841 Body Mass Index (BMI) 40.0 and over, adult: Secondary | ICD-10-CM | POA: Diagnosis not present

## 2022-01-05 DIAGNOSIS — K2900 Acute gastritis without bleeding: Secondary | ICD-10-CM | POA: Diagnosis present

## 2022-01-05 DIAGNOSIS — E119 Type 2 diabetes mellitus without complications: Secondary | ICD-10-CM

## 2022-01-05 DIAGNOSIS — R1013 Epigastric pain: Secondary | ICD-10-CM | POA: Diagnosis present

## 2022-01-05 DIAGNOSIS — R81 Glycosuria: Secondary | ICD-10-CM | POA: Diagnosis not present

## 2022-01-05 DIAGNOSIS — E785 Hyperlipidemia, unspecified: Secondary | ICD-10-CM | POA: Diagnosis present

## 2022-01-05 DIAGNOSIS — F329 Major depressive disorder, single episode, unspecified: Secondary | ICD-10-CM | POA: Diagnosis not present

## 2022-01-05 DIAGNOSIS — K219 Gastro-esophageal reflux disease without esophagitis: Secondary | ICD-10-CM | POA: Diagnosis not present

## 2022-01-05 DIAGNOSIS — K5730 Diverticulosis of large intestine without perforation or abscess without bleeding: Secondary | ICD-10-CM | POA: Diagnosis not present

## 2022-01-05 DIAGNOSIS — R112 Nausea with vomiting, unspecified: Secondary | ICD-10-CM | POA: Diagnosis not present

## 2022-01-05 DIAGNOSIS — Z1152 Encounter for screening for COVID-19: Secondary | ICD-10-CM | POA: Diagnosis not present

## 2022-01-05 DIAGNOSIS — E66813 Obesity, class 3: Secondary | ICD-10-CM | POA: Diagnosis present

## 2022-01-05 DIAGNOSIS — M109 Gout, unspecified: Secondary | ICD-10-CM | POA: Diagnosis not present

## 2022-01-05 DIAGNOSIS — E860 Dehydration: Secondary | ICD-10-CM | POA: Diagnosis present

## 2022-01-05 DIAGNOSIS — R7989 Other specified abnormal findings of blood chemistry: Secondary | ICD-10-CM | POA: Diagnosis not present

## 2022-01-05 DIAGNOSIS — K5900 Constipation, unspecified: Secondary | ICD-10-CM | POA: Diagnosis not present

## 2022-01-05 DIAGNOSIS — Z79899 Other long term (current) drug therapy: Secondary | ICD-10-CM | POA: Diagnosis not present

## 2022-01-05 DIAGNOSIS — R7401 Elevation of levels of liver transaminase levels: Secondary | ICD-10-CM | POA: Diagnosis present

## 2022-01-05 DIAGNOSIS — E7800 Pure hypercholesterolemia, unspecified: Secondary | ICD-10-CM | POA: Diagnosis not present

## 2022-01-05 DIAGNOSIS — Z90710 Acquired absence of both cervix and uterus: Secondary | ICD-10-CM | POA: Diagnosis not present

## 2022-01-05 DIAGNOSIS — R109 Unspecified abdominal pain: Secondary | ICD-10-CM | POA: Diagnosis present

## 2022-01-05 DIAGNOSIS — E872 Acidosis, unspecified: Secondary | ICD-10-CM | POA: Diagnosis not present

## 2022-01-05 DIAGNOSIS — N179 Acute kidney failure, unspecified: Secondary | ICD-10-CM | POA: Diagnosis not present

## 2022-01-05 HISTORY — DX: Gastro-esophageal reflux disease without esophagitis: K21.9

## 2022-01-05 HISTORY — DX: Anxiety disorder, unspecified: F41.9

## 2022-01-05 LAB — CBC WITH DIFFERENTIAL/PLATELET
Abs Immature Granulocytes: 0.03 10*3/uL (ref 0.00–0.07)
Abs Immature Granulocytes: 0.02 10*3/uL (ref 0.00–0.07)
Basophils Absolute: 0 10*3/uL (ref 0.0–0.1)
Basophils Absolute: 0 10*3/uL (ref 0.0–0.1)
Basophils Relative: 0 %
Basophils Relative: 0 %
Eosinophils Absolute: 0 10*3/uL (ref 0.0–0.5)
Eosinophils Absolute: 0 10*3/uL (ref 0.0–0.5)
Eosinophils Relative: 0 %
Eosinophils Relative: 0 %
HCT: 41.6 % (ref 36.0–46.0)
HCT: 38.5 % (ref 36.0–46.0)
Hemoglobin: 13.9 g/dL (ref 12.0–15.0)
Hemoglobin: 12.4 g/dL (ref 12.0–15.0)
Immature Granulocytes: 0 %
Immature Granulocytes: 0 %
Lymphocytes Relative: 19 %
Lymphocytes Relative: 24 %
Lymphs Abs: 1.7 10*3/uL (ref 0.7–4.0)
Lymphs Abs: 2.1 10*3/uL (ref 0.7–4.0)
MCH: 28.7 pg (ref 26.0–34.0)
MCH: 28.4 pg (ref 26.0–34.0)
MCHC: 33.4 g/dL (ref 30.0–36.0)
MCHC: 32.2 g/dL (ref 30.0–36.0)
MCV: 86 fL (ref 80.0–100.0)
MCV: 88.1 fL (ref 80.0–100.0)
Monocytes Absolute: 0.6 10*3/uL (ref 0.1–1.0)
Monocytes Absolute: 0.8 10*3/uL (ref 0.1–1.0)
Monocytes Relative: 7 %
Monocytes Relative: 10 %
Neutro Abs: 6.5 10*3/uL (ref 1.7–7.7)
Neutro Abs: 5.7 10*3/uL (ref 1.7–7.7)
Neutrophils Relative %: 74 %
Neutrophils Relative %: 66 %
Platelets: 428 10*3/uL — ABNORMAL HIGH (ref 150–400)
Platelets: 371 10*3/uL (ref 150–400)
RBC: 4.84 MIL/uL (ref 3.87–5.11)
RBC: 4.37 MIL/uL (ref 3.87–5.11)
RDW: 14.7 % (ref 11.5–15.5)
RDW: 15.2 % (ref 11.5–15.5)
WBC: 8.9 10*3/uL (ref 4.0–10.5)
WBC: 8.7 10*3/uL (ref 4.0–10.5)
nRBC: 0 % (ref 0.0–0.2)
nRBC: 0 % (ref 0.0–0.2)

## 2022-01-05 LAB — PHOSPHORUS: Phosphorus: 2.9 mg/dL (ref 2.5–4.6)

## 2022-01-05 LAB — URINALYSIS, ROUTINE W REFLEX MICROSCOPIC
Glucose, UA: 100 mg/dL — AB
Hgb urine dipstick: NEGATIVE
Ketones, ur: >=80 mg/dL — AB
Leukocytes,Ua: NEGATIVE
Nitrite: NEGATIVE
Protein, ur: NEGATIVE mg/dL
Specific Gravity, Urine: 1.015 (ref 1.005–1.030)
pH: 5 (ref 5.0–8.0)

## 2022-01-05 LAB — COMPREHENSIVE METABOLIC PANEL
ALT: 119 U/L — ABNORMAL HIGH (ref 0–44)
ALT: 97 U/L — ABNORMAL HIGH (ref 0–44)
AST: 147 U/L — ABNORMAL HIGH (ref 15–41)
AST: 102 U/L — ABNORMAL HIGH (ref 15–41)
Albumin: 3.8 g/dL (ref 3.5–5.0)
Albumin: 3.5 g/dL (ref 3.5–5.0)
Alkaline Phosphatase: 155 U/L — ABNORMAL HIGH (ref 38–126)
Alkaline Phosphatase: 125 U/L (ref 38–126)
Anion gap: 16 — ABNORMAL HIGH (ref 5–15)
Anion gap: 12 (ref 5–15)
BUN: 16 mg/dL (ref 6–20)
BUN: 11 mg/dL (ref 6–20)
CO2: 19 mmol/L — ABNORMAL LOW (ref 22–32)
CO2: 22 mmol/L (ref 22–32)
Calcium: 9.7 mg/dL (ref 8.9–10.3)
Calcium: 9.2 mg/dL (ref 8.9–10.3)
Chloride: 99 mmol/L (ref 98–111)
Chloride: 105 mmol/L (ref 98–111)
Creatinine, Ser: 1.4 mg/dL — ABNORMAL HIGH (ref 0.44–1.00)
Creatinine, Ser: 0.9 mg/dL (ref 0.44–1.00)
GFR, Estimated: 43 mL/min — ABNORMAL LOW (ref >60)
GFR, Estimated: >60 mL/min (ref >60)
Glucose, Bld: 280 mg/dL — ABNORMAL HIGH (ref 70–99)
Glucose, Bld: 184 mg/dL — ABNORMAL HIGH (ref 70–99)
Potassium: 4.9 mmol/L (ref 3.5–5.1)
Potassium: 3.9 mmol/L (ref 3.5–5.1)
Sodium: 134 mmol/L — ABNORMAL LOW (ref 135–145)
Sodium: 139 mmol/L (ref 135–145)
Total Bilirubin: 1.4 mg/dL — ABNORMAL HIGH (ref 0.3–1.2)
Total Bilirubin: 1.1 mg/dL (ref 0.3–1.2)
Total Protein: 9.6 g/dL — ABNORMAL HIGH (ref 6.5–8.1)
Total Protein: 8.4 g/dL — ABNORMAL HIGH (ref 6.5–8.1)

## 2022-01-05 LAB — TSH: TSH: 0.527 u[IU]/mL (ref 0.350–4.500)

## 2022-01-05 LAB — HEPATIC FUNCTION PANEL
ALT: 114 U/L — ABNORMAL HIGH (ref 0–44)
AST: 133 U/L — ABNORMAL HIGH (ref 15–41)
Albumin: 3.4 g/dL — ABNORMAL LOW (ref 3.5–5.0)
Alkaline Phosphatase: 146 U/L — ABNORMAL HIGH (ref 38–126)
Bilirubin, Direct: 0.2 mg/dL (ref 0.0–0.2)
Indirect Bilirubin: 0.9 mg/dL (ref 0.3–0.9)
Total Bilirubin: 1.1 mg/dL (ref 0.3–1.2)
Total Protein: 9.2 g/dL — ABNORMAL HIGH (ref 6.5–8.1)

## 2022-01-05 LAB — LACTIC ACID, PLASMA
Lactic Acid, Venous: 4.6 mmol/L (ref 0.5–1.9)
Lactic Acid, Venous: 3.4 mmol/L (ref 0.5–1.9)
Lactic Acid, Venous: 2.1 mmol/L (ref 0.5–1.9)

## 2022-01-05 LAB — GLUCOSE, CAPILLARY
Glucose-Capillary: 182 mg/dL — ABNORMAL HIGH (ref 70–99)
Glucose-Capillary: 161 mg/dL — ABNORMAL HIGH (ref 70–99)

## 2022-01-05 LAB — TROPONIN I (HIGH SENSITIVITY)
Troponin I (High Sensitivity): 22 ng/L — ABNORMAL HIGH (ref <18)
Troponin I (High Sensitivity): 20 ng/L — ABNORMAL HIGH (ref <18)

## 2022-01-05 LAB — I-STAT VENOUS BLOOD GAS, ED
Acid-base deficit: 3 mmol/L — ABNORMAL HIGH (ref 0.0–2.0)
Bicarbonate: 18.5 mmol/L — ABNORMAL LOW (ref 20.0–28.0)
Calcium, Ion: 1.19 mmol/L (ref 1.15–1.40)
HCT: 44 % (ref 36.0–46.0)
Hemoglobin: 15 g/dL (ref 12.0–15.0)
O2 Saturation: 83 %
Patient temperature: 98.1
Potassium: 3.7 mmol/L (ref 3.5–5.1)
Sodium: 137 mmol/L (ref 135–145)
TCO2: 19 mmol/L — ABNORMAL LOW (ref 22–32)
pCO2, Ven: 24.3 mmHg — ABNORMAL LOW (ref 44–60)
pH, Ven: 7.489 — ABNORMAL HIGH (ref 7.25–7.43)
pO2, Ven: 42 mmHg (ref 32–45)

## 2022-01-05 LAB — LIPASE, BLOOD: Lipase: 33 U/L (ref 11–51)

## 2022-01-05 LAB — PREGNANCY, URINE: Preg Test, Ur: NEGATIVE

## 2022-01-05 LAB — CBG MONITORING, ED: Glucose-Capillary: 174 mg/dL — ABNORMAL HIGH (ref 70–99)

## 2022-01-05 LAB — PROTIME-INR
INR: 1.1 (ref 0.8–1.2)
Prothrombin Time: 14.1 seconds (ref 11.4–15.2)

## 2022-01-05 LAB — MAGNESIUM: Magnesium: 1.4 mg/dL — ABNORMAL LOW (ref 1.7–2.4)

## 2022-01-05 LAB — HEMOGLOBIN A1C
Hgb A1c MFr Bld: 9.4 % — ABNORMAL HIGH (ref 4.8–5.6)
Mean Plasma Glucose: 223.08 mg/dL

## 2022-01-05 LAB — D-DIMER, QUANTITATIVE: D-Dimer, Quant: 0.62 ug/mL-FEU — ABNORMAL HIGH (ref 0.00–0.50)

## 2022-01-05 LAB — SARS CORONAVIRUS 2 BY RT PCR: SARS Coronavirus 2 by RT PCR: NEGATIVE

## 2022-01-05 MED ORDER — AMLODIPINE BESYLATE 5 MG PO TABS
5.0000 mg | ORAL_TABLET | Freq: Every day | ORAL | Status: DC
  Administered 2022-01-05 – 2022-01-06 (×2): 5 mg via ORAL
  Filled 2022-01-05 (×2): qty 1

## 2022-01-05 MED ORDER — PANTOPRAZOLE SODIUM 40 MG IV SOLR
40.0000 mg | Freq: Once | INTRAVENOUS | Status: AC
  Administered 2022-01-05: 40 mg via INTRAVENOUS
  Filled 2022-01-05: qty 10

## 2022-01-05 MED ORDER — PNEUMOCOCCAL 20-VAL CONJ VACC 0.5 ML IM SUSY
0.5000 mL | PREFILLED_SYRINGE | INTRAMUSCULAR | Status: DC
  Filled 2022-01-05: qty 0.5

## 2022-01-05 MED ORDER — ALLOPURINOL 300 MG PO TABS
300.0000 mg | ORAL_TABLET | Freq: Every day | ORAL | Status: DC
  Administered 2022-01-06 – 2022-01-10 (×5): 300 mg via ORAL
  Filled 2022-01-05 (×6): qty 1

## 2022-01-05 MED ORDER — INSULIN ASPART 100 UNIT/ML IJ SOLN
0.0000 [IU] | Freq: Three times a day (TID) | INTRAMUSCULAR | Status: DC
  Administered 2022-01-06: 3 [IU] via SUBCUTANEOUS
  Administered 2022-01-06: 2 [IU] via SUBCUTANEOUS
  Administered 2022-01-06: 3 [IU] via SUBCUTANEOUS
  Administered 2022-01-07: 5 [IU] via SUBCUTANEOUS
  Administered 2022-01-07: 2 [IU] via SUBCUTANEOUS
  Administered 2022-01-07 – 2022-01-08 (×2): 5 [IU] via SUBCUTANEOUS
  Administered 2022-01-08 – 2022-01-09 (×3): 3 [IU] via SUBCUTANEOUS
  Administered 2022-01-09: 2 [IU] via SUBCUTANEOUS
  Administered 2022-01-10: 5 [IU] via SUBCUTANEOUS
  Administered 2022-01-10: 2 [IU] via SUBCUTANEOUS

## 2022-01-05 MED ORDER — LACTATED RINGERS IV BOLUS
1000.0000 mL | Freq: Once | INTRAVENOUS | Status: AC
  Administered 2022-01-05: 1000 mL via INTRAVENOUS

## 2022-01-05 MED ORDER — SODIUM CHLORIDE 0.9% FLUSH
3.0000 mL | Freq: Two times a day (BID) | INTRAVENOUS | Status: DC
  Administered 2022-01-05 – 2022-01-10 (×7): 3 mL via INTRAVENOUS

## 2022-01-05 MED ORDER — POLYETHYLENE GLYCOL 3350 17 G PO PACK
17.0000 g | PACK | Freq: Every day | ORAL | Status: DC | PRN
  Administered 2022-01-06: 17 g via ORAL
  Filled 2022-01-05: qty 1

## 2022-01-05 MED ORDER — HYDRALAZINE HCL 20 MG/ML IJ SOLN: 10.0000 mg | Freq: Four times a day (QID) | INTRAMUSCULAR | Status: DC | PRN

## 2022-01-05 MED ORDER — ONDANSETRON HCL 4 MG/2ML IJ SOLN: 4.0000 mg | Freq: Once | INTRAMUSCULAR | Status: DC

## 2022-01-05 MED ORDER — CITALOPRAM HYDROBROMIDE 20 MG PO TABS
40.0000 mg | ORAL_TABLET | Freq: Every day | ORAL | Status: DC
  Administered 2022-01-06 – 2022-01-10 (×5): 40 mg via ORAL
  Filled 2022-01-05 (×5): qty 2

## 2022-01-05 MED ORDER — SENNA 8.6 MG PO TABS
1.0000 | ORAL_TABLET | Freq: Two times a day (BID) | ORAL | Status: DC
  Administered 2022-01-05 – 2022-01-08 (×6): 8.6 mg via ORAL
  Filled 2022-01-05 (×6): qty 1

## 2022-01-05 MED ORDER — INFLUENZA VAC SPLIT QUAD 0.5 ML IM SUSY: 0.5000 mL | PREFILLED_SYRINGE | INTRAMUSCULAR | Status: DC

## 2022-01-05 MED ORDER — INSULIN GLARGINE-YFGN 100 UNIT/ML ~~LOC~~ SOLN
10.0000 [IU] | Freq: Every day | SUBCUTANEOUS | Status: DC
  Administered 2022-01-05 – 2022-01-06 (×2): 10 [IU] via SUBCUTANEOUS
  Filled 2022-01-05 (×3): qty 0.1

## 2022-01-05 MED ORDER — ONDANSETRON HCL 4 MG PO TABS: 4.0000 mg | ORAL_TABLET | Freq: Four times a day (QID) | ORAL | Status: DC | PRN

## 2022-01-05 MED ORDER — SODIUM CHLORIDE 0.9 % IV BOLUS
1000.0000 mL | Freq: Once | INTRAVENOUS | Status: AC
  Administered 2022-01-05: 1000 mL via INTRAVENOUS

## 2022-01-05 MED ORDER — HYDROCODONE-ACETAMINOPHEN 10-325 MG PO TABS
1.0000 | ORAL_TABLET | Freq: Three times a day (TID) | ORAL | Status: DC | PRN
  Administered 2022-01-06 – 2022-01-10 (×10): 1 via ORAL
  Filled 2022-01-05 (×11): qty 1

## 2022-01-05 MED ORDER — ENOXAPARIN SODIUM 60 MG/0.6ML IJ SOSY
60.0000 mg | PREFILLED_SYRINGE | INTRAMUSCULAR | Status: DC
  Administered 2022-01-05 – 2022-01-09 (×5): 60 mg via SUBCUTANEOUS
  Filled 2022-01-05 (×5): qty 0.6

## 2022-01-05 MED ORDER — HYDROMORPHONE HCL 1 MG/ML IJ SOLN
1.0000 mg | Freq: Once | INTRAMUSCULAR | Status: AC
  Administered 2022-01-05: 1 mg via INTRAVENOUS
  Filled 2022-01-05: qty 1

## 2022-01-05 MED ORDER — ONDANSETRON HCL 4 MG/2ML IJ SOLN: 4.0000 mg | Freq: Four times a day (QID) | INTRAMUSCULAR | Status: DC | PRN

## 2022-01-05 MED ORDER — ATORVASTATIN CALCIUM 40 MG PO TABS: 80.0000 mg | ORAL_TABLET | Freq: Every day | ORAL | Status: DC

## 2022-01-05 MED ORDER — PROCHLORPERAZINE EDISYLATE 10 MG/2ML IJ SOLN
10.0000 mg | Freq: Once | INTRAMUSCULAR | Status: AC
  Administered 2022-01-05: 10 mg via INTRAVENOUS
  Filled 2022-01-05: qty 2

## 2022-01-05 MED ORDER — FLUTICASONE PROPIONATE 50 MCG/ACT NA SUSP
1.0000 | Freq: Every day | NASAL | Status: DC
  Administered 2022-01-06 – 2022-01-08 (×2): 1 via NASAL
  Filled 2022-01-05: qty 16

## 2022-01-05 MED ORDER — SODIUM CHLORIDE 0.9 % IV SOLN: INTRAVENOUS | Status: DC

## 2022-01-05 MED ORDER — MAGNESIUM SULFATE 4 GM/100ML IV SOLN
4.0000 g | Freq: Once | INTRAVENOUS | Status: AC
  Administered 2022-01-05: 4 g via INTRAVENOUS
  Filled 2022-01-05: qty 100

## 2022-01-05 MED ORDER — SORBITOL 70 % SOLN: 30.0000 mL | Freq: Every day | Status: DC | PRN

## 2022-01-05 MED ORDER — PANTOPRAZOLE SODIUM 40 MG IV SOLR
40.0000 mg | Freq: Two times a day (BID) | INTRAVENOUS | Status: DC
  Administered 2022-01-05 – 2022-01-10 (×10): 40 mg via INTRAVENOUS
  Filled 2022-01-05 (×10): qty 10

## 2022-01-05 MED ORDER — ONDANSETRON HCL 4 MG/2ML IJ SOLN
4.0000 mg | Freq: Once | INTRAMUSCULAR | Status: AC
  Administered 2022-01-05: 4 mg via INTRAVENOUS
  Filled 2022-01-05: qty 2

## 2022-01-05 MED ORDER — MORPHINE SULFATE (PF) 4 MG/ML IV SOLN
4.0000 mg | INTRAVENOUS | Status: DC | PRN
  Administered 2022-01-05 – 2022-01-08 (×8): 4 mg via INTRAVENOUS
  Filled 2022-01-05 (×8): qty 1

## 2022-01-05 MED ORDER — METOPROLOL TARTRATE 5 MG/5ML IV SOLN
5.0000 mg | Freq: Three times a day (TID) | INTRAVENOUS | Status: DC
  Administered 2022-01-05 – 2022-01-06 (×2): 5 mg via INTRAVENOUS
  Filled 2022-01-05 (×2): qty 5

## 2022-01-05 MED ORDER — CYCLOBENZAPRINE HCL 10 MG PO TABS
10.0000 mg | ORAL_TABLET | Freq: Two times a day (BID) | ORAL | Status: DC | PRN
  Administered 2022-01-09: 10 mg via ORAL
  Filled 2022-01-05: qty 1

## 2022-01-05 MED ORDER — MORPHINE SULFATE (PF) 4 MG/ML IV SOLN
4.0000 mg | Freq: Once | INTRAVENOUS | Status: AC
  Administered 2022-01-05: 4 mg via INTRAVENOUS
  Filled 2022-01-05: qty 1

## 2022-01-05 NOTE — ED Notes (Signed)
Patient continues to be tachy.  MD notified no new orders at this time.

## 2022-01-05 NOTE — Plan of Care (Signed)
[facility] will assume care on arrival to accepting facility. Until arrival, care as per EDP. However, [facility] available 24/7 for questions and assistance.  Nursing staff, please page [facility] Admits and Consults ([phone]) as soon as the patient arrives to the hospital.   

## 2022-01-05 NOTE — ED Notes (Signed)
Report given to [facility]. 

## 2022-01-05 NOTE — H&P (Signed)
History and Physical    [name] KYH:[id number] DOB: [birth year] DOA: 01/05/2022  [facility]P: [facility]  Patient coming from: Home  I have personally briefly reviewed patient's old medical records in [place]  Chief Complaint: Nausea vomiting cannot keep anything down.  HPI: Tracey Castillo is a 61 y.o. female with medical history significant of insulin-dependent type 2 diabetes, hypertension, hyperlipidemia, GERD who presented to [place] ED with a 8-day history of intractable nausea and emesis describing the emesis as brownish-reddish in nature, subjective fevers, nausea, abdominal pain, intermittent chills, lightheadedness and dizziness.  Patient denies any chest pain, no shortness of breath, no diarrhea, no constipation, no melena, no syncopal episodes.  Patient denies any recent sick contacts.  Patient denies any history of peptic ulcer disease.  ED Course: Patient seen in the ED noted initially to be tachycardic on presentation with heart rates in the 140s, noted to have a significantly elevated blood pressure of 204/69 which had improved, CBC done within normal limits.  Comprehensive metabolic profile done with a sodium of 134, bicarb of 19, glucose of 280, creatinine of 1.4, alk phosphatase of 155, AST of 147, ALT of 119, total protein of 9.6, bilirubin of 1.4.  Lactic acid initially elevated at 4.6 with trended down to 3.4 and subsequently 2.1.  Urine pregnancy negative.  Urinalysis cloudy, small bilirubin, 100 of glucose, ketones> 80, nitrite negative, leukocytes negative.  Blood cultures x2 ordered in the ED. patient hydrated with IV fluids with some improvement with tachycardic with heart rates now in the 120s.  Review of Systems: As per HPI otherwise all other systems reviewed and are negative.  Past Medical History:  Diagnosis Date   Anxiety    Contusion of right knee 02/17/2017   Diabetes mellitus    GERD (gastroesophageal reflux disease)     Gout    High cholesterol    Hypertension    Migraine     Past Surgical History:  Procedure Laterality Date   ABDOMINAL HYSTERECTOMY     CARPAL TUNNEL RELEASE     CHOLECYSTECTOMY     HAND SURGERY     KNEE SURGERY     TONSILLECTOMY      Social History  reports that she has never smoked. She has never used smokeless tobacco. She reports that she does not drink alcohol and does not use drugs.  Allergies  Allergen Reactions   Peanut Oil Itching    History reviewed. No pertinent family history. Mother deceased in his 67s from neurological issues per daughter, father deceased in his 1s from a motor vehicle accident.  Prior to Admission medications   Medication Sig Start Date End Date Taking? Authorizing Provider  HYDROcodone-acetaminophen (NORCO) 10-325 MG tablet Take 1 tablet by mouth 3 (three) times daily as needed for moderate pain.  07/20/19  Yes [provider]  losartan (COZAAR) 100 MG tablet Take 100 mg by mouth daily. 07/30/21  Yes [provider]  TRULICITY 4.5 TD/1.7OH SOPN SMARTSIG:4.5 Milligram(s) SUB-Q Once a Week 11/19/21  Yes [provider]  UBRELVY 100 MG TABS Take by mouth. 12/28/21  Yes [provider]  ACCU-CHEK AVIVA PLUS test strip 2 (two) times daily. test blood sugar 07/08/19   [provider]  allopurinol (ZYLOPRIM) 300 MG tablet Take 300 mg by mouth daily.    [provider]  atorvastatin (LIPITOR) 80 MG tablet Take 1 tablet (80 mg total) by mouth daily. 08/04/19 09/03/19  Antonieta Pert, MD  blood glucose  meter kit and supplies Dispense based on patient and insurance preference. Use up to four times daily as directed. (FOR ICD-10 E10.9, E11.9). 08/04/19   Antonieta Pert, MD  carvedilol (COREG) 25 MG tablet Take 1 tablet (25 mg total) by mouth 2 (two) times daily with a meal. 08/04/19 09/03/19  Antonieta Pert, MD  citalopram (CELEXA) 40 MG tablet Take 40 mg by mouth daily. Patient not taking: Reported on 01/05/2022 06/27/19    [provider]  colchicine 0.6 MG tablet Take 0.6 mg by mouth daily.      [provider]  cyclobenzaprine (FLEXERIL) 10 MG tablet Take 1 tablet (10 mg total) by mouth 2 (two) times daily as needed for muscle spasms. 09/15/19   Gareth Morgan, MD  fluticasone (FLONASE) 50 MCG/ACT nasal spray Place 1 spray into both nostrils daily. 05/17/19   [provider]  insulin aspart (NOVOLOG FLEXPEN) 100 UNIT/ML FlexPen Inject 5 Units into the skin 3 (three) times daily with meals. 08/04/19 10/03/19  Antonieta Pert, MD  insulin glargine (LANTUS SOLOSTAR) 100 UNIT/ML Solostar Pen Inject 20 Units into the skin at bedtime. 08/04/19   Antonieta Pert, MD  irbesartan (AVAPRO) 150 MG tablet Take 1 tablet (150 mg total) by mouth daily. 08/05/19 09/04/19  Antonieta Pert, MD  oxyCODONE-acetaminophen (PERCOCET) 10-325 MG tablet Take 1 tablet by mouth every 4 (four) hours as needed for pain.    [provider]  pantoprazole (PROTONIX) 40 MG tablet Take 1 tablet (40 mg total) by mouth at bedtime. 08/04/19 09/03/19  Antonieta Pert, MD  saxagliptin HCl (ONGLYZA) 5 MG TABS tablet Take 5 mg by mouth daily.    [provider]  tiZANidine (ZANAFLEX) 4 MG tablet Take 4 mg by mouth 3 (three) times daily. 07/17/19   [provider]    Physical Exam: Vitals:   01/05/22 1500 01/05/22 1507 01/05/22 1541 01/05/22 1634  BP: (!) 176/66  (!) 179/75 (!) 151/96  Pulse: (!) 124  (!) 125 (!) 121  Resp: 20  17 20   Temp:  98.7 F (37.1 C)  98.2 F (36.8 C)  TempSrc:  Oral  Oral  SpO2: 97%  97% 100%  Weight:      Height:        Constitutional: NAD, calm, comfortable Vitals:   01/05/22 1500 01/05/22 1507 01/05/22 1541 01/05/22 1634  BP: (!) 176/66  (!) 179/75 (!) 151/96  Pulse: (!) 124  (!) 125 (!) 121  Resp: 20  17 20   Temp:  98.7 F (37.1 C)  98.2 F (36.8 C)  TempSrc:  Oral  Oral  SpO2: 97%  97% 100%  Weight:      Height:       Eyes: PERRL, lids and conjunctivae normal ENMT: Mucous  membranes are dry. Posterior pharynx clear of any exudate or lesions.Normal dentition.  Neck: normal, supple, no masses, no thyromegaly Respiratory: clear to auscultation bilaterally, no wheezing, no crackles. Normal respiratory effort. No accessory muscle use.  Cardiovascular: Tachycardia.  No murmurs rubs or gallops.  No lower extremity edema.  Abdomen: Soft, significantly tender to palpation in the epigastric region and right upper quadrant, positive bowel sounds, no rebound, no guarding.  Musculoskeletal: no clubbing / cyanosis. No joint deformity upper and lower extremities. Good ROM, no contractures. Normal muscle tone.  Skin: no rashes, lesions, ulcers. No induration Neurologic: CN 2-12 grossly intact. Sensation intact, DTR normal. Strength 5/5 in all 4.  Psychiatric: Normal judgment and insight. Alert and oriented x 3. Normal mood.  Labs on Admission: I have personally reviewed following labs and imaging studies  CBC: Recent Labs  Lab 01/05/22 0109 01/05/22 0246  WBC 8.9  --   NEUTROABS 6.5  --   HGB 13.9 15.0  HCT 41.6 44.0  MCV 86.0  --   PLT 428*  --     Basic Metabolic Panel: Recent Labs  Lab 01/05/22 0109 01/05/22 0246  NA 134* 137  K 4.9 3.7  CL 99  --   CO2 19*  --   GLUCOSE 280*  --   BUN 16  --   CREATININE 1.40*  --   CALCIUM 9.7  --     GFR: Estimated Creatinine Clearance: 55.6 mL/min (A) (by C-G formula based on SCr of 1.4 mg/dL (H)).  Liver Function Tests: Recent Labs  Lab 01/05/22 0109 01/05/22 0336  AST 147* 133*  ALT 119* 114*  ALKPHOS 155* 146*  BILITOT 1.4* 1.1  PROT 9.6* 9.2*  ALBUMIN 3.8 3.4*    Urine analysis:    Component Value Date/Time   COLORURINE YELLOW 01/05/2022 0618   APPEARANCEUR CLOUDY (A) 01/05/2022 0618   LABSPEC 1.015 01/05/2022 0618   PHURINE 5.0 01/05/2022 0618   GLUCOSEU 100 (A) 01/05/2022 0618   HGBUR NEGATIVE 01/05/2022 0618   BILIRUBINUR SMALL (A) 01/05/2022 0618   KETONESUR >=80 (A) 01/05/2022 0618    PROTEINUR NEGATIVE 01/05/2022 0618   NITRITE NEGATIVE 01/05/2022 0618   LEUKOCYTESUR NEGATIVE 01/05/2022 0618    Radiological Exams on Admission: CT ABDOMEN PELVIS WO CONTRAST  Result Date: 01/05/2022 CLINICAL DATA:  Vomiting and epigastric pain for 1 week, initial encounter EXAM: CT ABDOMEN AND PELVIS WITHOUT CONTRAST TECHNIQUE: Multidetector CT imaging of the abdomen and pelvis was performed following the standard protocol without IV contrast. RADIATION DOSE REDUCTION: This exam was performed according to the departmental dose-optimization program which includes automated exposure control, adjustment of the mA and/or kV according to patient size and/or use of iterative reconstruction technique. COMPARISON:  07/30/2019 FINDINGS: Lower chest: No acute abnormality. Hepatobiliary: No focal liver abnormality is seen. Status post cholecystectomy. No biliary dilatation. Pancreas: Unremarkable. No pancreatic ductal dilatation or surrounding inflammatory changes. Spleen: Normal in size without focal abnormality. Adrenals/Urinary Tract: Adrenal glands are within normal limits. Kidneys show no renal calculi or obstructive changes. Small left renal cyst is noted stable from the prior exam. No further follow-up is recommended. No obstructive changes are seen. The bladder is decompressed. Stomach/Bowel: The appendix has been surgically removed. No obstructive or inflammatory changes of the colon are noted. Scattered diverticular changes noted. Small bowel and stomach are within normal limits with the exception of a small hiatal hernia. Vascular/Lymphatic: Aortic atherosclerosis. No enlarged abdominal or pelvic lymph nodes. Reproductive: Status post hysterectomy. No adnexal masses. Other: No abdominal wall hernia or abnormality. No abdominopelvic ascites. Musculoskeletal: Degenerative changes of lumbar spine are noted. Stable scarring in the anterior abdominal wall on the left is noted. IMPRESSION: Diverticulosis  without diverticulitis. No acute abnormality noted. Electronically Signed   By: Inez Catalina M.D.   On: 01/05/2022 02:20   DG Chest Port 1 View  Result Date: 01/05/2022 CLINICAL DATA:  Vomiting for 1 week, epigastric pain, and increasing shortness of breath. EXAM: PORTABLE CHEST 1 VIEW COMPARISON:  07/30/2019. FINDINGS: The heart size and mediastinal contours are within normal limits. There is mild atherosclerotic calcification of the aorta. Elevation of the right diaphragm is noted. No consolidation, effusion, or pneumothorax. Degenerative changes are present in the thoracic spine. No acute osseous abnormality.  IMPRESSION: No active disease. Electronically Signed   By: Brett Fairy M.D.   On: 01/05/2022 01:38    EKG: Independently reviewed.  Sinus tachycardia heart rate 140s  Assessment/Plan Principal Problem:   Intractable nausea and vomiting Active Problems:   Epigastric abdominal pain   SIRS (systemic inflammatory response syndrome) (HCC)   Transaminitis   Obesity, Class III, BMI 40-49.9 (morbid obesity) (HCC)   Dyslipidemia   GERD (gastroesophageal reflux disease)   MDD (major depressive disorder)   AKI (acute kidney injury) ([place])   Dehydration   Sinus tachycardia   Elevated liver function tests   Nausea and vomiting   Hypertension   #1 intractable nausea vomiting epigastric abdominal pain -Patient presented with nausea vomiting x8 days, epigastric abdominal pain and on examination right upper quadrant pain. -Patient noted on labs to have a transaminitis. -Patient with 20-year history of diabetes mellitus, insulin-dependent. -Patient denies any history of peptic ulcer disease. -CT abdomen and pelvis done with diverticulosis without diverticulitis, no acute abnormality noted, no biliary dilatation noted, status postcholecystectomy, no focal liver abnormality seen, unremarkable pancreas. -Labs on presentation in the ED noted a lactic acidosis which improved with hydration,  transaminitis, urinalysis with some glycosuria and ketonuria, initial comprehensive metabolic profile with a slight acidosis with bicarb of 20, anion gap of 16, glucose of 280. -Patient states last episode of emesis yesterday on presentation to the ED. -Repeat comprehensive metabolic profile, CBC with differential, check a lipase, check a troponin, chest check a COVID-19 PCR. -Hydrate aggressively with IV fluids. -Clear liquids. -Placed on IV PPI twice daily. -If no significant improvement may need a gastric emptying study and GI consultation for further evaluation and management.  2.  Dehydration -IV fluids.  3.  Transaminitis -?  Etiology. -CT abdomen and pelvis with no acute abnormalities noted. -Check an acute hepatitis panel. -IV fluids. -Repeat labs in the AM.  4.  SIRS -Patient met criteria of SIRS on presentation with tachycardia, elevated lactic acid level. -Blood cultures pending. -Chest x-ray with no acute abnormalities. -CT abdomen and pelvis with diverticulosis without diverticulitis, no acute abnormalities noted. -Urinalysis nitrite negative, leukocytes negative. -IV fluids, supportive care.  5.  GERD -IV PPI.  6.  Sinus tachycardia -Likely secondary to dehydration, nausea and vomiting. -Check a EKG, troponins, TSH, D-dimer. -Patient states was on Coreg prior to admission however run out of pills and needing refills. -IV fluids. -Place on IV Lopressor. -Supportive care.  7.  Diabetes mellitus type 2, insulin-dependent -Check a hemoglobin A1c. -Placed on half dose Semglee 10 units daily. -Moderate SSI.  8.  Hyperlipidemia -Hold statin.  9.  Acute kidney injury -Likely secondary to prerenal azotemia in the setting of ongoing nausea and vomiting with poor oral intake in the setting of ARB. -Hold ARB. -CT abdomen and pelvis with no obstructive changes noted, no renal calculi, bladder decompressed. -IV fluids. -Repeat labs in the AM.  10.  Morbid  obesity -Lifestyle modification -Outpatient follow-up with PCP.  11.  History of depressive disorder -Stable. -Resume home regimen Celexa.  12.  Hypertension -Due to acute kidney injury we will hold off on resuming patient's ARB. -Patient states was on Coreg at home prior to admission however ran out of pills and needs to get refills. -Place on IV Lopressor.   DVT prophylaxis: Lovenox Code Status:   Full Family Communication:  Updated patient, sister and brother at bedside. Disposition Plan:   Patient is from:  Home  Anticipated DC to:  Home  Anticipated DC  date:  3 to 4 days  Anticipated DC barriers: Clinical improvement Consults called:  None Admission status:  Placed in observation/telemetry.  Severity of Illness: The appropriate patient status for this patient is OBSERVATION. Observation status is judged to be reasonable and necessary in order to provide the required intensity of service to ensure the patient's safety. The patient's presenting symptoms, physical exam findings, and initial radiographic and laboratory data in the context of their medical condition is felt to place them at decreased risk for further clinical deterioration. Furthermore, it is anticipated that the patient will be medically stable for discharge from the hospital within 2 midnights of admission.     Irine Seal MD [facility]  How to contact the [facility] Attending or Consulting provider Susan Moore or covering provider during after hours [place], for this patient?   Check the care team in [facility] and look for a) attending/consulting [facility] provider listed and b) the [facility] team listed Log into [email or web address] and use [place]'s universal password to access. If you do not have the password, please contact the hospital operator. Locate the [facility] provider you are looking for under [facility] and page to a number that you can be directly reached. If you still have difficulty reaching the provider, please  page the [facility] (Director on Call) for the Hospitalists listed on amion for assistance.  01/05/2022, 6:30 PM

## 2022-01-05 NOTE — ED Notes (Signed)
Called 4E at [facility] for a purple man

## 2022-01-05 NOTE — ED Notes (Signed)
Previous two nurses report patient is a hard stick and unable to get 2nd set of blood cultures.

## 2022-01-05 NOTE — ED Provider Notes (Signed)
[facility] EMERGENCY DEPARTMENT Provider Note   CSN: [id number] Arrival date & time: 01/04/22  2341     History  Chief Complaint  Patient presents with   Vomiting    Tracey Castillo is a 61 y.o. female.  The history is provided by the patient.  She has history of hypertension, diabetes, hyperlipidemia and comes in because of upper abdominal pain and vomiting for the last 8 days.  She has not been able to hold anything down.  She denies any blood in the emesis.  She has had 1 bowel movement during this time and has been passing flatus.  She has had subjective fever but denies chills or sweats.  She has not done anything to treat her symptoms at home.  Blood sugars have been running high, in the 400s.  She has continued to take her insulin even though she has been vomiting.   Home Medications Prior to Admission medications   Medication Sig Start Date End Date Taking? Authorizing Provider  ACCU-CHEK AVIVA PLUS test strip 2 (two) times daily. test blood sugar 07/08/19   [provider]  allopurinol (ZYLOPRIM) 300 MG tablet Take 300 mg by mouth daily.    [provider]  atorvastatin (LIPITOR) 80 MG tablet Take 1 tablet (80 mg total) by mouth daily. 08/04/19 09/03/19  Antonieta Pert, MD  blood glucose meter kit and supplies Dispense based on patient and insurance preference. Use up to four times daily as directed. (FOR ICD-10 E10.9, E11.9). 08/04/19   Antonieta Pert, MD  carvedilol (COREG) 25 MG tablet Take 1 tablet (25 mg total) by mouth 2 (two) times daily with a meal. 08/04/19 09/03/19  Antonieta Pert, MD  citalopram (CELEXA) 40 MG tablet Take 40 mg by mouth daily. 06/27/19   [provider]  colchicine 0.6 MG tablet Take 0.6 mg by mouth daily.      [provider]  cyclobenzaprine (FLEXERIL) 10 MG tablet Take 1 tablet (10 mg total) by mouth 2 (two) times daily as needed for muscle spasms. 09/15/19   Gareth Morgan, MD  fluticasone (FLONASE) 50 MCG/ACT  nasal spray Place 1 spray into both nostrils daily. 05/17/19   [provider]  HYDROcodone-acetaminophen (NORCO) 10-325 MG tablet Take 1 tablet by mouth 3 (three) times daily as needed for moderate pain.  07/20/19   [provider]  insulin aspart (NOVOLOG FLEXPEN) 100 UNIT/ML FlexPen Inject 5 Units into the skin 3 (three) times daily with meals. 08/04/19 10/03/19  Antonieta Pert, MD  insulin glargine (LANTUS SOLOSTAR) 100 UNIT/ML Solostar Pen Inject 20 Units into the skin at bedtime. 08/04/19   Antonieta Pert, MD  irbesartan (AVAPRO) 150 MG tablet Take 1 tablet (150 mg total) by mouth daily. 08/05/19 09/04/19  Antonieta Pert, MD  pantoprazole (PROTONIX) 40 MG tablet Take 1 tablet (40 mg total) by mouth at bedtime. 08/04/19 09/03/19  Antonieta Pert, MD  saxagliptin HCl (ONGLYZA) 5 MG TABS tablet Take 5 mg by mouth daily.    [provider]  tiZANidine (ZANAFLEX) 4 MG tablet Take 4 mg by mouth 3 (three) times daily. 07/17/19   [provider]      Allergies    Peanut oil    Review of Systems   Review of Systems  All other systems reviewed and are negative.   Physical Exam Updated Vital Signs BP (!) 159/88 (BP Location: Right Arm)   Pulse (!) 135   Temp 98.2 F (36.8 C) (Oral)   Resp (!) 27  Ht _0  (1.676 m)   Wt 117 kg   SpO2 100%   BMI 41.64 kg/m  Physical Exam Vitals and nursing note reviewed.   61 year old female, resting comfortably and in no acute distress. Vital signs are significant for elevated heart rate, respiratory rate, blood pressure. Oxygen saturation is 100%, which is normal. Head is normocephalic and atraumatic. PERRLA, EOMI. Oropharynx is clear. Neck is nontender and supple without adenopathy or JVD. Back is nontender and there is no CVA tenderness. Lungs are clear without rales, wheezes, or rhonchi. Chest is nontender. Heart is tachycardic with 2/6 systolic ejection murmur heard diffusely through the precordium. Abdomen is soft, flat, with moderate  epigastric tenderness.  There is no rebound or guarding.  Peristalsis is hypoactive. Extremities have no cyanosis or edema, full range of motion is present. Skin is warm and dry without rash. Neurologic: Mental status is normal, cranial nerves are intact, moves all extremities equally.  ED Results / Procedures / Treatments   Labs (all labs ordered are listed, but only abnormal results are displayed) Labs Reviewed  COMPREHENSIVE METABOLIC PANEL - Abnormal; Notable for the following components:      Result Value   Sodium 134 (*)    CO2 19 (*)    Glucose, Bld 280 (*)    Creatinine, Ser 1.40 (*)    Total Protein 9.6 (*)    AST 147 (*)    ALT 119 (*)    Alkaline Phosphatase 155 (*)    Total Bilirubin 1.4 (*)    GFR, Estimated 43 (*)    Anion gap 16 (*)    All other components within normal limits  LACTIC ACID, PLASMA - Abnormal; Notable for the following components:   Lactic Acid, Venous 4.6 (*)    All other components within normal limits  LACTIC ACID, PLASMA - Abnormal; Notable for the following components:   Lactic Acid, Venous 3.4 (*)    All other components within normal limits  CBC WITH DIFFERENTIAL/PLATELET - Abnormal; Notable for the following components:   Platelets 428 (*)    All other components within normal limits  URINALYSIS, ROUTINE W REFLEX MICROSCOPIC - Abnormal; Notable for the following components:   APPearance CLOUDY (*)    Glucose, UA 100 (*)    Bilirubin Urine SMALL (*)    Ketones, ur >=80 (*)    All other components within normal limits  HEPATIC FUNCTION PANEL - Abnormal; Notable for the following components:   Total Protein 9.2 (*)    Albumin 3.4 (*)    AST 133 (*)    ALT 114 (*)    Alkaline Phosphatase 146 (*)    All other components within normal limits  I-STAT VENOUS BLOOD GAS, ED - Abnormal; Notable for the following components:   pH, Ven 7.489 (*)    pCO2, Ven 24.3 (*)    Bicarbonate 18.5 (*)    TCO2 19 (*)    Acid-base deficit 3.0 (*)    All  other components within normal limits  CULTURE, BLOOD (ROUTINE X 2)  CULTURE, BLOOD (ROUTINE X 2)  PROTIME-INR  PREGNANCY, URINE    EKG EKG Interpretation  Date/Time:  Tuesday January 04 2022 23:53:19 EDT Ventricular Rate:  149 PR Interval:  112 QRS Duration: 64 QT Interval:  334 QTC Calculation: 526 R Axis:   58 Text Interpretation: Sinus tachycardia Right atrial enlargement Borderline ECG When compared with ECG of 02-Aug-2019 16:09, HEART RATE has increased T wave abnormality is no longer present  Confirmed by Delora Fuel ([id number]) on 01/05/2022 1:45:11 AM  Radiology CT ABDOMEN PELVIS WO CONTRAST  Result Date: 01/05/2022 CLINICAL DATA:  Vomiting and epigastric pain for 1 week, initial encounter EXAM: CT ABDOMEN AND PELVIS WITHOUT CONTRAST TECHNIQUE: Multidetector CT imaging of the abdomen and pelvis was performed following the standard protocol without IV contrast. RADIATION DOSE REDUCTION: This exam was performed according to the departmental dose-optimization program which includes automated exposure control, adjustment of the mA and/or kV according to patient size and/or use of iterative reconstruction technique. COMPARISON:  07/30/2019 FINDINGS: Lower chest: No acute abnormality. Hepatobiliary: No focal liver abnormality is seen. Status post cholecystectomy. No biliary dilatation. Pancreas: Unremarkable. No pancreatic ductal dilatation or surrounding inflammatory changes. Spleen: Normal in size without focal abnormality. Adrenals/Urinary Tract: Adrenal glands are within normal limits. Kidneys show no renal calculi or obstructive changes. Small left renal cyst is noted stable from the prior exam. No further follow-up is recommended. No obstructive changes are seen. The bladder is decompressed. Stomach/Bowel: The appendix has been surgically removed. No obstructive or inflammatory changes of the colon are noted. Scattered diverticular changes noted. Small bowel and stomach are within normal  limits with the exception of a small hiatal hernia. Vascular/Lymphatic: Aortic atherosclerosis. No enlarged abdominal or pelvic lymph nodes. Reproductive: Status post hysterectomy. No adnexal masses. Other: No abdominal wall hernia or abnormality. No abdominopelvic ascites. Musculoskeletal: Degenerative changes of lumbar spine are noted. Stable scarring in the anterior abdominal wall on the left is noted. IMPRESSION: Diverticulosis without diverticulitis. No acute abnormality noted. Electronically Signed   By: Inez Catalina M.D.   On: 01/05/2022 02:20   DG Chest Port 1 View  Result Date: 01/05/2022 CLINICAL DATA:  Vomiting for 1 week, epigastric pain, and increasing shortness of breath. EXAM: PORTABLE CHEST 1 VIEW COMPARISON:  07/30/2019. FINDINGS: The heart size and mediastinal contours are within normal limits. There is mild atherosclerotic calcification of the aorta. Elevation of the right diaphragm is noted. No consolidation, effusion, or pneumothorax. Degenerative changes are present in the thoracic spine. No acute osseous abnormality. IMPRESSION: No active disease. Electronically Signed   By: Brett Fairy M.D.   On: 01/05/2022 01:38    Procedures Procedures  Cardiac monitor shows sinus tachycardia, per my interpretation.  Medications Ordered in ED Medications  lactated ringers bolus 1,000 mL (has no administration in time range)  prochlorperazine (COMPAZINE) injection 10 mg (has no administration in time range)  ondansetron (ZOFRAN) injection 4 mg (4 mg Intravenous Given 01/05/22 0124)  lactated ringers bolus 1,000 mL (1,000 mLs Intravenous New Bag/Given 01/05/22 0229)  morphine (PF) 4 MG/ML injection 4 mg (4 mg Intravenous Given 01/05/22 0222)  pantoprazole (PROTONIX) injection 40 mg (40 mg Intravenous Given 01/05/22 0220)    ED Course/ Medical Decision Making/ A&P                           Medical Decision Making Amount and/or Complexity of Data Reviewed Labs: ordered. Radiology:  ordered.  Risk Prescription drug management. Decision regarding hospitalization.   Abdominal pain and vomiting.  Consider small bowel obstruction, diverticulitis, pancreatitis, cholecystitis, gastritis.  She appears to be dehydrated based on tachycardia, but does not have clue small respirations that would be typical of ketoacidosis.  I have ordered IV fluids, morphine for pain, ondansetron for nausea.  I have reviewed and interpreted her electrocardiogram and my interpretation is sinus tachycardia with no ST or T changes.  I have reviewed and interpreted her  laboratory tests, and my interpretation is mild hyponatremia which is not felt to be clinically significant, mild metabolic acidosis with borderline elevated anion gap, doubt ketoacidosis, mild hyperglycemia, elevated alkaline phosphatase and transaminases and bilirubin but in the setting of hemolysis present will need to repeat these tests.  If accurate, could represent hepatitis which could also be behind her symptoms.  Creatinine is noted to be significantly elevated compared with prior, but most recent value was on 08/04/2019.  I suspect that this is acute kidney injury secondary to dehydration but have no recent labs to compare with.  Elevated lactic acid level is felt to represent dehydration and not sepsis.  I have ordered CT of abdomen and pelvis to further evaluate her condition.  I have reviewed her past medical records, and on 07/30/2019 she was admitted to the hospital for ketoacidosis.  At that time, her initial glucose was 737 and an initial CO2 10.  Chest x-ray shows no acute process.  CT scan shows no acute intra-abdominal process.  I have independently viewed the images, and agree with the radiologist's interpretation.  Heart rate has come down slightly with initial IV fluids, I have ordered additional IV fluids.  I have reviewed her venous blood gas and my interpretation is that it shows no evidence of ketoacidosis.  Patient continues  to complain of nausea, I have ordered a dose of prochlorperazine.  I have discussed case with Dr. Marlowe Sax of [facility], who agrees to admit the patient.  Repeat lactic acid level has decreased to 3.4, still elevated.  Additional IV fluids have been ordered.  Repeat hepatic function panel shows no significant change in transaminase levels and alkaline phosphatase level although bilirubin is now normal.  CRITICAL CARE Performed by: Delora Fuel Total critical care time: 60 minutes Critical care time was exclusive of separately billable procedures and treating other patients. Critical care was necessary to treat or prevent imminent or life-threatening deterioration. Critical care was time spent personally by me on the following activities: development of treatment plan with patient and/or surrogate as well as nursing, discussions with consultants, evaluation of patient's response to treatment, examination of patient, obtaining history from patient or surrogate, ordering and performing treatments and interventions, ordering and review of laboratory studies, ordering and review of radiographic studies, pulse oximetry and re-evaluation of patient's condition.  Final Clinical Impression(s) / ED Diagnoses Final diagnoses:  Acute kidney injury (nontraumatic) (HCC)  Nausea and vomiting, unspecified vomiting type  Elevated lactic acid level  Elevated liver function tests    Rx / DC Orders ED Discharge Orders     None         Delora Fuel, MD 94/47/39 [phone]

## 2022-01-05 NOTE — ED Notes (Signed)
Attempted x 1 for IV unsuccessful, requested [place] iv

## 2022-01-05 NOTE — ED Notes (Signed)
Writer attempted to place IV in left [facility], attempt was unsuccessful.

## 2022-01-06 ENCOUNTER — Observation Stay (HOSPITAL_COMMUNITY): Payer: Medicaid Other

## 2022-01-06 DIAGNOSIS — N179 Acute kidney failure, unspecified: Secondary | ICD-10-CM | POA: Diagnosis not present

## 2022-01-06 DIAGNOSIS — E860 Dehydration: Secondary | ICD-10-CM

## 2022-01-06 DIAGNOSIS — E785 Hyperlipidemia, unspecified: Secondary | ICD-10-CM | POA: Diagnosis not present

## 2022-01-06 DIAGNOSIS — R778 Other specified abnormalities of plasma proteins: Secondary | ICD-10-CM | POA: Diagnosis not present

## 2022-01-06 DIAGNOSIS — R7989 Other specified abnormal findings of blood chemistry: Secondary | ICD-10-CM

## 2022-01-06 DIAGNOSIS — R112 Nausea with vomiting, unspecified: Secondary | ICD-10-CM | POA: Diagnosis not present

## 2022-01-06 LAB — ECHOCARDIOGRAM COMPLETE
AR max vel: 1.77 cm2
AV Area VTI: 1.81 cm2
AV Area mean vel: 1.59 cm2
AV Mean grad: 10 mmHg
AV Peak grad: 17.8 mmHg
Ao pk vel: 2.11 m/s
Area-P 1/2: 3.99 cm2
Calc EF: 66.7 %
S' Lateral: 2.4 cm
Single Plane A2C EF: 61.2 %
Single Plane A4C EF: 68.3 %
Weight: 4056.46 oz

## 2022-01-06 LAB — GLUCOSE, CAPILLARY
Glucose-Capillary: 138 mg/dL — ABNORMAL HIGH (ref 70–99)
Glucose-Capillary: 155 mg/dL — ABNORMAL HIGH (ref 70–99)
Glucose-Capillary: 155 mg/dL — ABNORMAL HIGH (ref 70–99)
Glucose-Capillary: 169 mg/dL — ABNORMAL HIGH (ref 70–99)

## 2022-01-06 LAB — CBC WITH DIFFERENTIAL/PLATELET
Abs Immature Granulocytes: 0.01 10*3/uL (ref 0.00–0.07)
Basophils Absolute: 0 10*3/uL (ref 0.0–0.1)
Basophils Relative: 1 %
Eosinophils Absolute: 0.1 10*3/uL (ref 0.0–0.5)
Eosinophils Relative: 1 %
HCT: 35.4 % — ABNORMAL LOW (ref 36.0–46.0)
Hemoglobin: 11.4 g/dL — ABNORMAL LOW (ref 12.0–15.0)
Immature Granulocytes: 0 %
Lymphocytes Relative: 31 %
Lymphs Abs: 2.1 10*3/uL (ref 0.7–4.0)
MCH: 28.3 pg (ref 26.0–34.0)
MCHC: 32.2 g/dL (ref 30.0–36.0)
MCV: 87.8 fL (ref 80.0–100.0)
Monocytes Absolute: 0.9 10*3/uL (ref 0.1–1.0)
Monocytes Relative: 14 %
Neutro Abs: 3.5 10*3/uL (ref 1.7–7.7)
Neutrophils Relative %: 53 %
Platelets: 337 10*3/uL (ref 150–400)
RBC: 4.03 MIL/uL (ref 3.87–5.11)
RDW: 15.4 % (ref 11.5–15.5)
WBC: 6.6 10*3/uL (ref 4.0–10.5)
nRBC: 0 % (ref 0.0–0.2)

## 2022-01-06 LAB — URINALYSIS, COMPLETE (UACMP) WITH MICROSCOPIC
Bilirubin Urine: NEGATIVE
Glucose, UA: NEGATIVE mg/dL
Hgb urine dipstick: NEGATIVE
Ketones, ur: 20 mg/dL — AB
Leukocytes,Ua: NEGATIVE
Nitrite: NEGATIVE
Protein, ur: NEGATIVE mg/dL
Specific Gravity, Urine: 1.015 (ref 1.005–1.030)
pH: 5 (ref 5.0–8.0)

## 2022-01-06 LAB — COMPREHENSIVE METABOLIC PANEL
ALT: 87 U/L — ABNORMAL HIGH (ref 0–44)
AST: 94 U/L — ABNORMAL HIGH (ref 15–41)
Albumin: 3.1 g/dL — ABNORMAL LOW (ref 3.5–5.0)
Alkaline Phosphatase: 110 U/L (ref 38–126)
Anion gap: 8 (ref 5–15)
BUN: 8 mg/dL (ref 6–20)
CO2: 22 mmol/L (ref 22–32)
Calcium: 8.7 mg/dL — ABNORMAL LOW (ref 8.9–10.3)
Chloride: 111 mmol/L (ref 98–111)
Creatinine, Ser: 0.82 mg/dL (ref 0.44–1.00)
GFR, Estimated: >60 mL/min (ref >60)
Glucose, Bld: 138 mg/dL — ABNORMAL HIGH (ref 70–99)
Potassium: 3.6 mmol/L (ref 3.5–5.1)
Sodium: 141 mmol/L (ref 135–145)
Total Bilirubin: 0.9 mg/dL (ref 0.3–1.2)
Total Protein: 7.5 g/dL (ref 6.5–8.1)

## 2022-01-06 LAB — HEPATITIS PANEL, ACUTE
HCV Ab: NONREACTIVE
Hep A IgM: NONREACTIVE
Hep B C IgM: NONREACTIVE
Hepatitis B Surface Ag: NONREACTIVE

## 2022-01-06 LAB — HIV ANTIBODY (ROUTINE TESTING W REFLEX): HIV Screen 4th Generation wRfx: NONREACTIVE

## 2022-01-06 LAB — LACTIC ACID, PLASMA: Lactic Acid, Venous: 0.8 mmol/L (ref 0.5–1.9)

## 2022-01-06 LAB — MAGNESIUM: Magnesium: 2.3 mg/dL (ref 1.7–2.4)

## 2022-01-06 MED ORDER — PERFLUTREN LIPID MICROSPHERE
1.0000 mL | INTRAVENOUS | Status: AC | PRN
  Administered 2022-01-06: 2 mL via INTRAVENOUS

## 2022-01-06 MED ORDER — CARVEDILOL 25 MG PO TABS
25.0000 mg | ORAL_TABLET | Freq: Two times a day (BID) | ORAL | Status: DC
  Administered 2022-01-06 – 2022-01-10 (×9): 25 mg via ORAL
  Filled 2022-01-06 (×9): qty 1

## 2022-01-06 MED ORDER — SUCRALFATE 1 GM/10ML PO SUSP
1.0000 g | Freq: Three times a day (TID) | ORAL | Status: DC
  Administered 2022-01-06 – 2022-01-10 (×14): 1 g via ORAL
  Filled 2022-01-06 (×13): qty 10

## 2022-01-06 MED ORDER — HYDROXYZINE HCL 25 MG PO TABS
25.0000 mg | ORAL_TABLET | Freq: Every day | ORAL | Status: DC
  Administered 2022-01-06 – 2022-01-09 (×4): 25 mg via ORAL
  Filled 2022-01-06 (×4): qty 1

## 2022-01-06 MED ORDER — BISACODYL 10 MG RE SUPP: 10.0000 mg | Freq: Once | RECTAL | Status: DC

## 2022-01-06 NOTE — Progress Notes (Signed)
  Transition of Care [facility]) Screening Note   Patient Details  Name: Tracey Castillo Date of Birth: Jan 10, 1961   Transition of Care [facility]) CM/SW Contact:    Dessa Phi, RN Phone Number: 01/06/2022, 2:53 PM    Transition of Care Department [facility]) has reviewed patient and no TOC needs have been identified at this time. We will continue to monitor patient advancement through interdisciplinary progression rounds. If new patient transition needs arise, please place a TOC consult.

## 2022-01-06 NOTE — Progress Notes (Signed)
  Echocardiogram 2D Echocardiogram has been performed.  Tracey Castillo 01/06/2022, 10:52 AM

## 2022-01-06 NOTE — Evaluation (Signed)
Physical Therapy Evaluation Patient Details Name: Tracey Castillo MRN: [id number] DOB: 09/20/1960 Today's Date: 01/06/2022  History of Present Illness  61 y.o. female with medical history significant of insulin-dependent type 2 diabetes, hypertension, hyperlipidemia, GERD, gout who presented to med [facility] ED with a 8-day history of intractable nausea and emesis. Dx of dehydration, diverticulosis, lactic acidosis.  Clinical Impression  Pt ambulated 100' without an assistive device, no loss of balance. She was able to perform bed mobility and transfers without assistance. She does not need further PT as she is mobilizing well independently. PT signing off.        Recommendations for follow up therapy are one component of a multi-disciplinary discharge planning process, led by the attending physician.  Recommendations may be updated based on patient status, additional functional criteria and insurance authorization.  Follow Up Recommendations No PT follow up      Assistance Recommended at Discharge None  Patient can return home with the following       Equipment Recommendations None recommended by PT  Recommendations for Other Services       Functional Status Assessment Patient has not had a recent decline in their functional status     Precautions / Restrictions Precautions Precautions: Fall Precaution Comments: pt denied h/o falls in past 6 months, however she told OT she's had a few falls due to her dogs Restrictions Weight Bearing Restrictions: No      Mobility  Bed Mobility Overal bed mobility: Modified Independent             General bed mobility comments: used rails, HOB up    Transfers Overall transfer level: Independent Equipment used: None                    Ambulation/Gait Ambulation/Gait assistance: Independent Gait Distance (Feet): 100 Feet Assistive device: None Gait Pattern/deviations: WFL(Within Functional Limits)        General Gait Details: steady, no loss of balance  Stairs            Wheelchair Mobility    Modified Rankin (Stroke Patients Only)       Balance Overall balance assessment: Mild deficits observed, not formally tested                                           Pertinent Vitals/Pain Pain Assessment Pain Assessment: 0-10 Pain Score: 7  Pain Location: abdomen Pain Descriptors / Indicators: Aching Pain Intervention(s): Limited activity within patient's tolerance, Monitored during session, Repositioned, Patient requesting pain meds-RN notified    Home Living Family/patient expects to be discharged to:: Private residence Living Arrangements: Children Available Help at Discharge: Family;Available 24 hours/day Type of Home: Apartment Home Access: Stairs to enter Entrance Stairs-Rails: Left Entrance Stairs-Number of Steps: 5   Home Layout: One level Home Equipment: None Additional Comments: son and daughter live with pt    Prior Function Prior Level of Function : Independent/Modified Independent             Mobility Comments: ambulates without DME, denies falls in past 6 months ADLs Comments: stands for showering, daughter does IADL     Hand Dominance   Dominant Hand: Right    Extremity/Trunk Assessment   Upper Extremity Assessment Upper Extremity Assessment: Defer to OT evaluation    Lower Extremity Assessment Lower Extremity Assessment: Overall WFL for tasks assessed  Cervical / Trunk Assessment Cervical / Trunk Assessment: Normal  Communication   Communication: No difficulties  Cognition Arousal/Alertness: Awake/alert Behavior During Therapy: WFL for tasks assessed/performed, Flat affect Overall Cognitive Status: Within Functional Limits for tasks assessed                                 General Comments: able to follow commands        General Comments      Exercises     Assessment/Plan    PT Assessment  Patient does not need any further PT services  PT Problem List         PT Treatment Interventions      PT Goals (Current goals can be found in the Care Plan section)  Acute Rehab PT Goals PT Goal Formulation: All assessment and education complete, DC therapy    Frequency       Co-evaluation               AM-PAC PT "6 Clicks" Mobility  Outcome Measure Help needed turning from your back to your side while in a flat bed without using bedrails?: None Help needed moving from lying on your back to sitting on the side of a flat bed without using bedrails?: None Help needed moving to and from a bed to a chair (including a wheelchair)?: None Help needed standing up from a chair using your arms (e.g., wheelchair or bedside chair)?: None Help needed to walk in hospital room?: None Help needed climbing 3-5 steps with a railing? : None 6 Click Score: 24    End of Session Equipment Utilized During Treatment: Gait belt Activity Tolerance: Patient tolerated treatment well Patient left: in chair;with call bell/phone within reach Nurse Communication: Mobility status      Time: 0258-5277 PT Time Calculation (min) (ACUTE ONLY): 12 min   Charges:   PT Evaluation $PT Eval Low Complexity: 1 Low          Philomena Doheny PT 01/06/2022  Acute Rehabilitation Services  Office [phone]

## 2022-01-06 NOTE — Progress Notes (Signed)
Occupational Therapy Evaluation Patient Details Name: Tracey Castillo MRN: [id number] DOB: 22-Nov-1960 Today's Date: 01/06/2022   History of Present Illness 61 y.o. female with medical history significant of insulin-dependent type 2 diabetes, hypertension, hyperlipidemia, GERD, gout who presented to [place] ED with a 8-day history of intractable nausea and emesis. Dx of dehydration, diverticulosis, lactic acidosis.   Clinical Impression   Pt typically mod I in home environment without DME. States only falls have been from when her 2 small dogs run under her feet and trip her. Children do IADL. Pt today is up ambulating around room when OT entered. Able to demonstrate transfers, sink level grooming, toilet transfer and peri care, LB dressing at mod I level. Pt with little line safety awareness. Pt also falling asleep mid conversation once seated in recliner. Pt with no complaints of nausea to OT. Since Pt functioning at baseline for ADL OT will sign off at this time, education complete.      Recommendations for follow up therapy are one component of a multi-disciplinary discharge planning process, led by the attending physician.  Recommendations may be updated based on patient status, additional functional criteria and insurance authorization.   Follow Up Recommendations  No OT follow up    Assistance Recommended at Discharge PRN  Patient can return home with the following Direct supervision/assist for medications management;Assist for transportation;Help with stairs or ramp for entrance    Functional Status Assessment  Patient has had a recent decline in their functional status and demonstrates the ability to make significant improvements in function in a reasonable and predictable amount of time.  Equipment Recommendations  None recommended by OT    Recommendations for Other Services       Precautions / Restrictions Restrictions Weight Bearing Restrictions: No       Mobility Bed Mobility               General bed mobility comments: OOB in recliner    Transfers Overall transfer level: Modified independent Equipment used: None               General transfer comment: OT assisting with line management      Balance Overall balance assessment: Mild deficits observed, not formally tested                                         ADL either performed or assessed with clinical judgement   ADL Overall ADL's : At baseline                                       General ADL Comments: Pt able to demonstrate UB and LB dressing, transfers, sink level grooming. no line awareness, sleepy once in recliner - but when moving able to function and perform all aspects of ADL without physical assist     Vision Baseline Vision/History: 0 No visual deficits Ability to See in Adequate Light: 0 Adequate Patient Visual Report: No change from baseline       Perception     Praxis      Pertinent Vitals/Pain Pain Assessment Pain Assessment: No/denies pain     Hand Dominance Right   Extremity/Trunk Assessment Upper Extremity Assessment Upper Extremity Assessment: Overall WFL for tasks assessed   Lower Extremity Assessment Lower Extremity Assessment: Defer to  PT evaluation       Communication Communication Communication: No difficulties   Cognition Arousal/Alertness: Awake/alert (moments of closing eyes and needing arousal) Behavior During Therapy: Flat affect Overall Cognitive Status: No family/caregiver present to determine baseline cognitive functioning                                 General Comments: Pt oriented, following commands, able to answer set up questions but falling asleep very easily even mid-conversation     General Comments       Exercises     Shoulder Instructions      Home Living Family/patient expects to be discharged to:: Private residence Living Arrangements:  Children Available Help at Discharge: Family;Available PRN/intermittently Type of Home: Apartment Home Access: Stairs to enter Entrance Stairs-Number of Steps: 5 Entrance Stairs-Rails: Left Home Layout: One level     Bathroom Shower/Tub: Chief Strategy Officer: Standard     Home Equipment: None   Additional Comments: son and daughter live with pt      Prior Functioning/Environment Prior Level of Function : Independent/Modified Independent             Mobility Comments: ambulates without DME, denies falls in past 6 months ADLs Comments: stands for showering, daughter does IADL        OT Problem List: Decreased activity tolerance      OT Treatment/Interventions:      OT Goals(Current goals can be found in the care plan section) Acute Rehab OT Goals Patient Stated Goal: get home to dogs OT Goal Formulation: With patient Time For Goal Achievement: 01/20/22 Potential to Achieve Goals: Good  OT Frequency:      Co-evaluation              AM-PAC OT "6 Clicks" Daily Activity     Outcome Measure Help from another person eating meals?: None Help from another person taking care of personal grooming?: None Help from another person toileting, which includes using toliet, bedpan, or urinal?: None Help from another person bathing (including washing, rinsing, drying)?: None Help from another person to put on and taking off regular upper body clothing?: None Help from another person to put on and taking off regular lower body clothing?: None 6 Click Score: 24   End of Session Equipment Utilized During Treatment: Gait belt Nurse Communication: Mobility status (no chair alarm)  Activity Tolerance: Patient tolerated treatment well Patient left: in chair;with call bell/phone within reach;Other (comment) (RN aware that she has been moving around the room independently)  OT Visit Diagnosis: Other abnormalities of gait and mobility (R26.89);Muscle weakness  (generalized) (M62.81)                Time: 0263-7858 OT Time Calculation (min): 17 min Charges:  OT General Charges $OT Visit: 1 Visit OT Evaluation $OT Eval Low Complexity: 1 Low  Nyoka Cowden OTR/L Acute Rehabilitation Services Office: [phone]  Emelda Fear 01/06/2022, 2:33 PM

## 2022-01-06 NOTE — Consult Note (Signed)
Referring Provider: Dr. Grandville Silos Primary Care Physician:  [facility] Primary Gastroenterologist:  Althia Forts  Reason for Consultation:  Epigastric pain; Nausea/vomiting  HPI: Tracey Castillo is a 61 y.o. female with Type 2 DM on insulin admitted for 8 days of intractable nausea/vomiting and sharp epigastric pain. Denies hematemesis, melena, or hematochezia. Has a formed stool twice per week without straining and reports last BM was one week ago. Denies NSAIDs or alcohol. No history of ulcers. Tolerated full liquids so far today without vomiting. Mild nausea at this time. Has been heartburn recently that is new for her.  Past Medical History:  Diagnosis Date   Anxiety    Contusion of right knee 02/17/2017   Diabetes mellitus    GERD (gastroesophageal reflux disease)    Gout    High cholesterol    Hypertension    Migraine     Past Surgical History:  Procedure Laterality Date   ABDOMINAL HYSTERECTOMY     CARPAL TUNNEL RELEASE     CHOLECYSTECTOMY     HAND SURGERY     KNEE SURGERY     TONSILLECTOMY      Prior to Admission medications   Medication Sig Start Date End Date Taking? Authorizing Provider  hydrOXYzine (VISTARIL) 25 MG capsule Take 25 mg by mouth at bedtime. 07/30/21  Yes [provider]  insulin aspart (NOVOLOG FLEXPEN) 100 UNIT/ML FlexPen Inject 5 Units into the skin 3 (three) times daily with meals. 08/04/19 01/05/22 Yes Kc, Maren Beach, MD  insulin glargine (LANTUS SOLOSTAR) 100 UNIT/ML Solostar Pen Inject 20 Units into the skin at bedtime. 08/04/19  Yes Antonieta Pert, MD  losartan (COZAAR) 100 MG tablet Take 100 mg by mouth daily. 07/30/21  Yes [provider]  oxyCODONE-acetaminophen (PERCOCET) 10-325 MG tablet Take 1 tablet by mouth every 4 (four) hours as needed for pain.   Yes [provider]  TRULICITY 4.5 TM/1.9QQ SOPN SMARTSIG:4.5 Milligram(s) SUB-Q Once a Week 11/19/21  Yes [provider]  UBRELVY 100 MG TABS Take by mouth.  12/28/21  Yes [provider]  ACCU-CHEK AVIVA PLUS test strip 2 (two) times daily. test blood sugar 07/08/19   [provider]  Accu-Chek Softclix Lancets lancets 4 (four) times daily. 10/30/21   [provider]  B-D ULTRAFINE III SHORT PEN 31G X 8 MM MISC Inject into the skin 4 (four) times daily. 09/27/21   [provider]  blood glucose meter kit and supplies Dispense based on patient and insurance preference. Use up to four times daily as directed. (FOR ICD-10 E10.9, E11.9). 08/04/19   Antonieta Pert, MD  carvedilol (COREG) 25 MG tablet Take 1 tablet (25 mg total) by mouth 2 (two) times daily with a meal. Patient not taking: Reported on 01/05/2022 08/04/19 09/03/19  Antonieta Pert, MD  citalopram (CELEXA) 40 MG tablet Take 40 mg by mouth daily. Patient not taking: Reported on 01/05/2022 06/27/19   [provider]  folic acid (FOLVITE) 1 MG tablet Take 1 mg by mouth daily. Patient not taking: Reported on 01/05/2022 07/16/21   [provider]  HYDROcodone-acetaminophen (NORCO) 10-325 MG tablet Take 1 tablet by mouth 3 (three) times daily as needed for moderate pain.  Patient not taking: Reported on 01/05/2022 07/20/19   [provider]  irbesartan (AVAPRO) 150 MG tablet Take 1 tablet (150 mg total) by mouth daily. Patient not taking: Reported on 01/05/2022 08/05/19 09/04/19  Antonieta Pert, MD  omeprazole (PRILOSEC) 40 MG capsule Take 40 mg by mouth daily. Patient  not taking: Reported on 01/05/2022 09/30/21   [provider]  pantoprazole (PROTONIX) 40 MG tablet Take 1 tablet (40 mg total) by mouth at bedtime. Patient not taking: Reported on 01/05/2022 08/04/19 09/03/19  Antonieta Pert, MD  tizanidine (ZANAFLEX) 6 MG capsule Take 6 mg by mouth 3 (three) times daily as needed. Patient not taking: Reported on 01/05/2022 12/02/21   [provider]    Scheduled Meds:  allopurinol  300 mg Oral Daily   amLODipine  5 mg Oral Daily   bisacodyl  10  mg Rectal Once   carvedilol  25 mg Oral BID   citalopram  40 mg Oral Daily   enoxaparin (LOVENOX) injection  60 mg Subcutaneous Q24H   fluticasone  1 spray Each Nare Daily   hydrOXYzine  25 mg Oral QHS   influenza vac split quadrivalent PF  0.5 mL Intramuscular Tomorrow-1000   insulin aspart  0-15 Units Subcutaneous TID WC   insulin glargine-yfgn  10 Units Subcutaneous QHS   pantoprazole (PROTONIX) IV  40 mg Intravenous Q12H   pneumococcal 20-valent conjugate vaccine  0.5 mL Intramuscular Tomorrow-1000   senna  1 tablet Oral BID   sodium chloride flush  3 mL Intravenous Q12H   Continuous Infusions:  sodium chloride 100 mL/hr at 01/06/22 0935   PRN Meds:.cyclobenzaprine, hydrALAZINE, HYDROcodone-acetaminophen, morphine injection, ondansetron **OR** ondansetron (ZOFRAN) IV, polyethylene glycol, sorbitol  Allergies as of 01/04/2022 - Review Complete 01/04/2022  Allergen Reaction Noted   Peanut oil Itching 01/19/2011    History reviewed. No pertinent family history.  Social History   Socioeconomic History   Marital status: Single    Spouse name: Not on file   Number of children: Not on file   Years of education: Not on file   Highest education level: Not on file  Occupational History   Not on file  Tobacco Use   Smoking status: Never   Smokeless tobacco: Never  Substance and Sexual Activity   Alcohol use: No   Drug use: No   Sexual activity: Yes    Birth control/protection: Surgical  Other Topics Concern   Not on file  Social History Narrative   Not on file   Social Determinants of Health   Financial Resource Strain: Not on file  Food Insecurity: No Food Insecurity (01/05/2022)   Hunger Vital Sign    Worried About Running Out of Food in the Last Year: Never true    Ran Out of Food in the Last Year: Never true  Transportation Needs: No Transportation Needs (01/05/2022)   PRAPARE - Hydrologist (Medical): No    Lack of Transportation  (Non-Medical): No  Physical Activity: Not on file  Stress: Not on file  Social Connections: Not on file  Intimate Partner Violence: Not At Risk (01/05/2022)   Humiliation, Afraid, Rape, and Kick questionnaire    Fear of Current or Ex-Partner: No    Emotionally Abused: No    Physically Abused: No    Sexually Abused: No    Review of Systems: All negative except as stated above in HPI.  Physical Exam: Vital signs: Vitals:   01/06/22 0739 01/06/22 1242  BP: (!) 158/62 (!) 154/69  Pulse: 91 88  Resp: 18 18  Temp: 98 F (36.7 C) 97.7 F (36.5 C)  SpO2: 99% 100%   Last BM Date :  (patient stated it was over a week ago) General:  Lethargic, obese, no acute distress   Head: normocephalic, atraumatic Eyes:  anicteric sclera ENT: oropharynx clear Neck: supple, nontender Lungs:  Clear throughout to auscultation.   No wheezes, crackles, or rhonchi. No acute distress. Heart:  Regular rate and rhythm; no murmurs, clicks, rubs,  or gallops. Abdomen: diffuse tenderness (worse in epigastric region) with guarding, soft, nondistended, +BS Rectal:  Deferred Ext: no edema  GI:  Lab Results: Recent Labs    01/05/22 0109 01/05/22 0246 01/05/22 1830 01/06/22 0435  WBC 8.9  --  8.7 6.6  HGB 13.9 15.0 12.4 11.4*  HCT 41.6 44.0 38.5 35.4*  PLT 428*  --  371 337   BMET Recent Labs    01/05/22 0109 01/05/22 0246 01/05/22 1830 01/06/22 0435  NA 134* 137 139 141  K 4.9 3.7 3.9 3.6  CL 99  --  105 111  CO2 19*  --  22 22  GLUCOSE 280*  --  184* 138*  BUN 16  --  11 8  CREATININE 1.40*  --  0.90 0.82  CALCIUM 9.7  --  9.2 8.7*   LFT Recent Labs    01/05/22 0336 01/05/22 1830 01/06/22 0435  PROT 9.2*   < > 7.5  ALBUMIN 3.4*   < > 3.1*  AST 133*   < > 94*  ALT 114*   < > 87*  ALKPHOS 146*   < > 110  BILITOT 1.1   < > 0.9  BILIDIR 0.2  --   --   IBILI 0.9  --   --    < > = values in this interval not displayed.   PT/INR Recent Labs    01/05/22 0109  LABPROT 14.1  INR  1.1     Studies/Results: ECHOCARDIOGRAM COMPLETE  Result Date: 01/06/2022    ECHOCARDIOGRAM REPORT   Patient Name:   EVAMARIA DETORE Date of Exam: 01/06/2022 Medical Rec #:  [id number]         Height:       66.0 in Accession #:    [id number]        Weight:       253.5 lb Date of Birth:  [birth year]         BSA:          2.212 m Patient Age:    31 years          BP:           158/62 mmHg Patient Gender: F                 HR:           96 bpm. Exam Location:  Inpatient Procedure: 2D Echo and Intracardiac Opacification Agent Indications:    elevated troponin  History:        Patient has prior history of Echocardiogram examinations, most                 recent 08/01/2019. Risk Factors:Hypertension and Dyslipidemia.  Sonographer:    Harvie Junior Referring Phys: [phone] DANIEL V THOMPSON  Sonographer Comments: Technically difficult study due to poor echo windows and patient is obese. Image acquisition challenging due to patient body habitus. IMPRESSIONS  1. Left ventricular ejection fraction, by estimation, is 60 to 65%. The left ventricle has normal function. The left ventricle has no regional wall motion abnormalities. There is mild concentric left ventricular hypertrophy. Left ventricular diastolic parameters are indeterminate.  2. Right ventricular systolic function is normal. The right ventricular size is normal. There is normal pulmonary artery systolic pressure.  3. The  mitral valve is normal in structure. Trivial mitral valve regurgitation. No evidence of mitral stenosis.  4. Tricuspid aortic valve with focal calcification on the left cusp. Aortic valve regurgitation is not visualized. Aortic valve sclerosis/calcification is present, without any evidence of aortic stenosis.  5. The inferior vena cava is normal in size with greater than 50% respiratory variability, suggesting right atrial pressure of 3 mmHg. FINDINGS  Left Ventricle: Left ventricular ejection fraction, by estimation, is 60 to 65%. The left  ventricle has normal function. The left ventricle has no regional wall motion abnormalities. Definity contrast agent was given IV to delineate the left ventricular  endocardial borders. The left ventricular internal cavity size was normal in size. There is mild concentric left ventricular hypertrophy. Left ventricular diastolic parameters are indeterminate. Right Ventricle: The right ventricular size is normal. No increase in right ventricular wall thickness. Right ventricular systolic function is normal. There is normal pulmonary artery systolic pressure. The tricuspid regurgitant velocity is 2.50 m/s, and  with an assumed right atrial pressure of 3 mmHg, the estimated right ventricular systolic pressure is 72.0 mmHg. Left Atrium: Left atrial size was normal in size. Right Atrium: Right atrial size was normal in size. Pericardium: Trivial pericardial effusion is present. The pericardial effusion is posterior to the left ventricle. Presence of epicardial fat layer. Mitral Valve: The mitral valve is normal in structure. There is mild thickening of the mitral valve leaflet(s). Mild mitral annular calcification. Trivial mitral valve regurgitation. No evidence of mitral valve stenosis. Tricuspid Valve: The tricuspid valve is normal in structure. Tricuspid valve regurgitation is mild . No evidence of tricuspid stenosis. Aortic Valve: Tricuspid aortic valve with focal calcification on the left cusp. Aortic valve regurgitation is not visualized. Aortic valve sclerosis/calcification is present, without any evidence of aortic stenosis. Aortic valve mean gradient measures 10.0 mmHg. Aortic valve peak gradient measures 17.8 mmHg. Aortic valve area, by VTI measures 1.81 cm. Pulmonic Valve: The pulmonic valve was normal in structure. Pulmonic valve regurgitation is trivial. No evidence of pulmonic stenosis. Aorta: The aortic root is normal in size and structure. Venous: The inferior vena cava is normal in size with greater than  50% respiratory variability, suggesting right atrial pressure of 3 mmHg. IAS/Shunts: No atrial level shunt detected by color flow Doppler.  LEFT VENTRICLE PLAX 2D LVIDd:         3.90 cm     Diastology LVIDs:         2.40 cm     LV e' medial:    5.98 cm/s LV PW:         1.00 cm     LV E/e' medial:  14.3 LV IVS:        1.10 cm     LV e' lateral:   8.05 cm/s LVOT diam:     1.90 cm     LV E/e' lateral: 10.6 LV SV:         67 LV SV Index:   30 LVOT Area:     2.84 cm  LV Volumes (MOD) LV vol d, MOD A2C: 49.7 ml LV vol d, MOD A4C: 81.2 ml LV vol s, MOD A2C: 19.3 ml LV vol s, MOD A4C: 25.7 ml LV SV MOD A2C:     30.4 ml LV SV MOD A4C:     81.2 ml LV SV MOD BP:      46.4 ml RIGHT VENTRICLE RV Basal diam:  2.70 cm RV Mid diam:    2.40 cm TAPSE (  M-mode): 1.9 cm LEFT ATRIUM             Index       RIGHT ATRIUM          Index LA diam:        3.40 cm 1.54 cm/m  RA Area:     7.52 cm LA Vol (A2C):   18.8 ml 8.50 ml/m  RA Volume:   12.10 ml 5.47 ml/m LA Vol (A4C):   15.2 ml 6.87 ml/m LA Biplane Vol: 16.9 ml 7.64 ml/m  AORTIC VALVE                     PULMONIC VALVE AV Area (Vmax):    1.77 cm      PV Vmax:       1.54 m/s AV Area (Vmean):   1.59 cm      PV Peak grad:  9.5 mmHg AV Area (VTI):     1.81 cm AV Vmax:           211.00 cm/s AV Vmean:          144.000 cm/s AV VTI:            0.370 m AV Peak Grad:      17.8 mmHg AV Mean Grad:      10.0 mmHg LVOT Vmax:         132.00 cm/s LVOT Vmean:        81.000 cm/s LVOT VTI:          0.236 m LVOT/AV VTI ratio: 0.64  AORTA Ao Root diam: 2.70 cm Ao Asc diam:  2.70 cm MITRAL VALVE                TRICUSPID VALVE MV Area (PHT): 3.99 cm     TR Peak grad:   25.0 mmHg MV Decel Time: 190 msec     TR Vmax:        250.00 cm/s MV E velocity: 85.30 cm/s MV A velocity: 101.00 cm/s  SHUNTS MV E/A ratio:  0.84         Systemic VTI:  0.24 m                             Systemic Diam: 1.90 cm Kardie Tobb DO Electronically signed by Berniece Salines DO Signature Date/Time: 01/06/2022/11:26:43 AM    Final     CT ABDOMEN PELVIS WO CONTRAST  Result Date: 01/05/2022 CLINICAL DATA:  Vomiting and epigastric pain for 1 week, initial encounter EXAM: CT ABDOMEN AND PELVIS WITHOUT CONTRAST TECHNIQUE: Multidetector CT imaging of the abdomen and pelvis was performed following the standard protocol without IV contrast. RADIATION DOSE REDUCTION: This exam was performed according to the departmental dose-optimization program which includes automated exposure control, adjustment of the mA and/or kV according to patient size and/or use of iterative reconstruction technique. COMPARISON:  07/30/2019 FINDINGS: Lower chest: No acute abnormality. Hepatobiliary: No focal liver abnormality is seen. Status post cholecystectomy. No biliary dilatation. Pancreas: Unremarkable. No pancreatic ductal dilatation or surrounding inflammatory changes. Spleen: Normal in size without focal abnormality. Adrenals/Urinary Tract: Adrenal glands are within normal limits. Kidneys show no renal calculi or obstructive changes. Small left renal cyst is noted stable from the prior exam. No further follow-up is recommended. No obstructive changes are seen. The bladder is decompressed. Stomach/Bowel: The appendix has been surgically removed. No obstructive or inflammatory changes of the colon are noted. Scattered diverticular changes  noted. Small bowel and stomach are within normal limits with the exception of a small hiatal hernia. Vascular/Lymphatic: Aortic atherosclerosis. No enlarged abdominal or pelvic lymph nodes. Reproductive: Status post hysterectomy. No adnexal masses. Other: No abdominal wall hernia or abnormality. No abdominopelvic ascites. Musculoskeletal: Degenerative changes of lumbar spine are noted. Stable scarring in the anterior abdominal wall on the left is noted. IMPRESSION: Diverticulosis without diverticulitis. No acute abnormality noted. Electronically Signed   By: Inez Catalina M.D.   On: 01/05/2022 02:20   DG Chest Port 1  View  Result Date: 01/05/2022 CLINICAL DATA:  Vomiting for 1 week, epigastric pain, and increasing shortness of breath. EXAM: PORTABLE CHEST 1 VIEW COMPARISON:  07/30/2019. FINDINGS: The heart size and mediastinal contours are within normal limits. There is mild atherosclerotic calcification of the aorta. Elevation of the right diaphragm is noted. No consolidation, effusion, or pneumothorax. Degenerative changes are present in the thoracic spine. No acute osseous abnormality. IMPRESSION: No active disease. Electronically Signed   By: Brett Fairy M.D.   On: 01/05/2022 01:38    Impression/Plan: Epigastric pain/N/V - doubt peptic ulcer disease but still possible. Suspect dyspepsia but gastroparesis also in the differential. LFTs improving. Hold off on EGD unless unable to tolerate advancing of diet. Tolerating full liquids so far today. Continue IV PPI Q 12 hours. Add Sucralfate slurry QID. Supportive care. Will follow.    LOS: 0 days   [name]  01/06/2022, 1:54 PM  Questions please call [phone]

## 2022-01-06 NOTE — Inpatient Diabetes Management (Signed)
Inpatient Diabetes Program Recommendations  AACE/ADA: New Consensus Statement on Inpatient Glycemic Control (2015)  Target Ranges:  Prepandial:   less than 140 mg/dL      Peak postprandial:   less than 180 mg/dL (1-2 hours)      Critically ill patients:  140 - 180 mg/dL   Lab Results  Component Value Date   GLUCAP 155 (H) 01/06/2022   HGBA1C 9.4 (H) 01/05/2022    Review of Glycemic Control  Latest Reference Range & Units 01/05/22 16:26 01/05/22 21:02 01/06/22 07:27 01/06/22 11:30  Glucose-Capillary 70 - 99 mg/dL 182 (H) 161 (H) 138 (H) 155 (H)  (H): Data is abnormally high Diabetes history: Type 2 DM Outpatient Diabetes medications: Trulicity 4.5 mg Qwk, Novolog 5 units TID, Lantus 20 units QHS Current orders for Inpatient glycemic control: Semglee 10 units QHS, Novolog 0-15 units TID  Inpatient Diabetes Program Recommendations:    Spoke with patient regarding outpatient diabetes management. Patient verifies home medications and denies missing doses. Reviewed patient's current A1c of 9.4% down from >12% in 2021. Explained what a A1c is and what it measures. Also reviewed goal A1c with patient, importance of good glucose control @ home, and blood sugar goals. Reviewed patho of DM, need for improved control, role of pancreas, vascular changes, and commorbidities.  Patient reports checking CBGs multiple times a day and that the lowest values have been in the 170's mg/dL. Patient is scheduled for a follow up with PCP in 2 weeks. Reviewed when to reach out to Md.  Admits to drinking large amounts of soda per day (~5-7 cans). When asked if she could half this amount or reduce intake she states, "I think you are crazy and I just have to have it." Not interested in making beverage or dietary changes. Educated on how these sugars increase blood sugars and reviewed alternatives.  No further questions at this time.   Thanks, Bronson Curb, MSN, RNC-OB Diabetes Coordinator [phone]  (8a-5p)

## 2022-01-06 NOTE — Progress Notes (Signed)
PROGRESS NOTE    Tracey Castillo  YTK:[id number] DOB: 04-20-1960 DOA: 01/05/2022 PCP: [facility]    Chief Complaint  Patient presents with   Vomiting    Brief Narrative:  Patient 61 year old female history of insulin-dependent type 2 diabetes, hypertension, hyperlipidemia, GERD presented to [place] freestanding ED with a day history of intractable nausea vomiting, epigastric abdominal pain.  Patient noted to have a transaminitis, noted to be dehydrated, noted to have elevated blood pressure on presentation.  Patient admitted for further evaluation and work-up.   Assessment & Plan:   Principal Problem:   Intractable nausea and vomiting Active Problems:   Epigastric abdominal pain   SIRS (systemic inflammatory response syndrome) (HCC)   Transaminitis   Obesity, Class III, BMI 40-49.9 (morbid obesity) (HCC)   Dyslipidemia   GERD (gastroesophageal reflux disease)   MDD (major depressive disorder)   AKI (acute kidney injury) ([place])   Dehydration   Sinus tachycardia   Elevated liver function tests   Nausea and vomiting   Hypertension   Hypomagnesemia   Acute kidney injury (nontraumatic) (HCC)   Elevated lactic acid level  #1 intractable nausea vomiting epigastric abdominal pain -Patient presented with nausea vomiting x8 days, epigastric abdominal pain and on examination right upper quadrant pain. -Concern for gastritis versus peptic ulcer disease versus upper GI etiology. -Patient noted on labs to have a transaminitis which is trending back down. -Patient with 20-year history of diabetes mellitus, insulin-dependent. -Patient denies any history of peptic ulcer disease. -CT abdomen and pelvis done with diverticulosis without diverticulitis, no acute abnormality noted, no biliary dilatation noted, status postcholecystectomy, no focal liver abnormality seen, unremarkable pancreas. -Labs on presentation in the ED noted a lactic acidosis which improved with  hydration, transaminitis, urinalysis with some glycosuria and ketonuria, initial comprehensive metabolic profile with a slight acidosis with bicarb of 20, anion gap of 16, glucose of 280. -Patient states last episode of emesis was on 01/04/2022.  -Improving clinically with no further emesis but does endorse nausea.   -Was placed on clear liquid diet yesterday, advance to full liquid diet this morning which she seems to be tolerating.   -Patient however still with ongoing epigastric and right upper quadrant pain, nausea, concern for upper GI etiology, peptic ulcer disease versus gastritis.   -Continue IV PPI twice daily.   -Consult with GI for further evaluation and management.    2.  Dehydration -IV fluids.   3.  Transaminitis -?  Etiology. -Likely secondary to dehydration. -CT abdomen and pelvis with no acute abnormalities noted. -Acute hepatitis panel negative.   -LFTs trending down with fluid resuscitation.   -IV fluids, supportive care.   -Repeat labs in the AM.   4.  SIRS -Patient met criteria of SIRS on presentation with tachycardia, elevated lactic acid level. -Blood cultures pending with no growth to date. -Chest x-ray with no acute abnormalities. -CT abdomen and pelvis with diverticulosis without diverticulitis, no acute abnormalities noted. -Urinalysis nitrite negative, leukocytes negative. -IV fluids, supportive care. -Hold off on antibiotics at this time.   5.  GERD -Continue IV PPI.   6.  Sinus tachycardia -Likely secondary to dehydration, nausea and vomiting. -Patient also noted to have been on a beta-blocker prior to admission and had run out of medications and needed refills. -Improving. -EKG with sinus tachycardia with no ischemic changes noted.  -TSH within normal limits at 0.527.  D-dimer slightly elevated however low probability for PE.   -Troponin noted at 22 >> 20, and flattened.  Patient denies any overt chest pain.   -2D echo with EF of 60 to 65%, NWMA.    -Continue for fluid resuscitation with IV fluids.   -Was on IV Lopressor on admission transition back to home dose oral Coreg.   -Supportive care.   7.  Diabetes mellitus type 2, insulin-dependent -Hemoglobin A1c at 9.4.  -CBG 138 this morning.  -Continue current regimen of Semglee 10 units daily, SSI.  -Diabetes coordinator following.   8.  Hyperlipidemia -Continue to hold statin.   9.  Acute kidney injury -Likely secondary to prerenal azotemia in the setting of ongoing nausea and vomiting with poor oral intake in the setting of ARB. -Renal function improved with hydration. -Continue to hold ARB. -CT abdomen and pelvis with no obstructive changes noted, no renal calculi, bladder decompressed. -Decrease IV fluid rate.   -Repeat labs in the AM.   10.  Morbid obesity -Lifestyle modification -Outpatient follow-up with PCP.   11.  History of depressive disorder -Stable. -Celexa.   12.  Hypertension -Due to acute kidney injury ARB was held on admission.  -Patient on IV Lopressor will change and placed back on home regimen Coreg. -Patient started on Norvasc 5 mg daily. -IV hydralazine as needed.      DVT prophylaxis: Lovenox Code Status: Full Family Communication: Updated patient.  No family at bedside. Disposition: Home once clinically improved, tolerating oral intake.  Status is: Observation The patient remains OBS appropriate and will d/c before 2 midnights.   Consultants:  Gastroenterology: Dr. Michail Sermon pending  Procedures:  CT abdomen and pelvis 01/05/2022 Chest x-ray 01/05/2022  Antimicrobials:  None   Subjective: Patient sitting up in bed.  Denies any further emesis but complains of nausea.  Still with epigastric and upper abdominal pain.  Just started with full liquids which she seems to be tolerating.  No chest pain.  No shortness of breath.  Overall feels a little bit better than she did on admission.  Objective: Vitals:   01/06/22 0027 01/06/22  0446 01/06/22 0739 01/06/22 1242  BP: (!) 173/61 (!) 177/70 (!) 158/62 (!) 154/69  Pulse: (!) 102 (!) 101 91 88  Resp:   18 18  Temp: 98.2 F (36.8 C) 97.7 F (36.5 C) 98 F (36.7 C) 97.7 F (36.5 C)  TempSrc: Oral Oral Oral Oral  SpO2: 99% 99% 99% 100%  Weight:  115 kg    Height:        Intake/Output Summary (Last 24 hours) at 01/06/2022 1819 Last data filed at 01/06/2022 1254 Gross per 24 hour  Intake 2840.15 ml  Output 550 ml  Net 2290.15 ml   Filed Weights   01/04/22 2349 01/06/22 0446  Weight: 117 kg 115 kg    Examination:  General exam: NAD. Respiratory system: Lungs clear to auscultation bilaterally.  No wheezes, no crackles, no rhonchi.  Fair air movement.  Speaking in full sentences.   Cardiovascular system: Regular rate rhythm no murmurs rubs or gallops.  No JVD.  No lower extremity edema. Gastrointestinal system: Abdomen is soft, nontender, nondistended, positive bowel sounds.  No rebound.  No guarding.   Central nervous system: Alert and oriented. No focal neurological deficits. Extremities: Symmetric 5 x 5 power. Skin: No rashes, lesions or ulcers Psychiatry: Judgement and insight appear normal. Mood & affect appropriate.     Data Reviewed: I have personally reviewed following labs and imaging studies  CBC: Recent Labs  Lab 01/05/22 0109 01/05/22 0246 01/05/22 1830 01/06/22 0435  WBC 8.9  --  8.7 6.6  NEUTROABS 6.5  --  5.7 3.5  HGB 13.9 15.0 12.4 11.4*  HCT 41.6 44.0 38.5 35.4*  MCV 86.0  --  88.1 87.8  PLT 428*  --  371 370    Basic Metabolic Panel: Recent Labs  Lab 01/05/22 0109 01/05/22 0246 01/05/22 1830 01/06/22 0435  NA 134* 137 139 141  K 4.9 3.7 3.9 3.6  CL 99  --  105 111  CO2 19*  --  22 22  GLUCOSE 280*  --  184* 138*  BUN 16  --  11 8  CREATININE 1.40*  --  0.90 0.82  CALCIUM 9.7  --  9.2 8.7*  MG  --   --  1.4* 2.3  PHOS  --   --  2.9  --     GFR: Estimated Creatinine Clearance: 94 mL/min (by C-G formula based on  SCr of 0.82 mg/dL).  Liver Function Tests: Recent Labs  Lab 01/05/22 0109 01/05/22 0336 01/05/22 1830 01/06/22 0435  AST 147* 133* 102* 94*  ALT 119* 114* 97* 87*  ALKPHOS 155* 146* 125 110  BILITOT 1.4* 1.1 1.1 0.9  PROT 9.6* 9.2* 8.4* 7.5  ALBUMIN 3.8 3.4* 3.5 3.1*    CBG: Recent Labs  Lab 01/05/22 1626 01/05/22 2102 01/06/22 0727 01/06/22 1130 01/06/22 1744  GLUCAP 182* 161* 138* 155* 155*     Recent Results (from the past 240 hour(s))  Culture, blood (Routine x 2)     Status: None (Preliminary result)   Collection Time: 01/05/22  1:00 AM   Specimen: BLOOD  Result Value Ref Range Status   Specimen Description   Final    BLOOD LEFT ANTECUBITAL Performed at [facility], [address] [place], [place] [address]    Special Requests   Final    BOTTLES DRAWN AEROBIC AND ANAEROBIC Blood Culture adequate volume Performed at [facility], [address] [place], [place] [address]    Culture   Final    NO GROWTH < 24 HOURS Performed at [facility], [place] [address], [place], [place] [address]    Report Status PENDING  Incomplete  Culture, blood (single)     Status: None (Preliminary result)   Collection Time: 01/05/22  9:34 AM   Specimen: BLOOD RIGHT HAND  Result Value Ref Range Status   Specimen Description   Final    BLOOD RIGHT HAND Performed at [facility], [address] [place], [place] [address]    Special Requests   Final    BOTTLES DRAWN AEROBIC AND ANAEROBIC Blood Culture adequate volume Performed at [facility], [address] [place], [place] [address]    Culture   Final    NO GROWTH < 24 HOURS Performed at [facility], [place] [address] [place], [place] [address]    Report Status PENDING  Incomplete  SARS Coronavirus 2 by RT PCR (hospital order, performed in [facility] hospital lab) *cepheid single result test* Anterior Nasal Swab     Status: None   Collection Time: 01/05/22  6:36 PM    Specimen: Anterior Nasal Swab  Result Value Ref Range Status   SARS Coronavirus 2 by RT PCR NEGATIVE NEGATIVE Final    Comment: (NOTE) SARS-CoV-2 target nucleic acids are NOT DETECTED.  The SARS-CoV-2 RNA is generally detectable in upper and lower respiratory specimens during the acute phase of infection. The lowest concentration of SARS-CoV-2 viral  copies this assay can detect is 250 copies / mL. A negative result does not preclude SARS-CoV-2 infection and should not be used as the sole basis for treatment or other patient management decisions.  A negative result may occur with improper specimen collection / handling, submission of specimen other than nasopharyngeal swab, presence of viral mutation(s) within the areas targeted by this assay, and inadequate number of viral copies (<250 copies / mL). A negative result must be combined with clinical observations, patient history, and epidemiological information.  Fact Sheet for Patients:   https://www.patel.info/  Fact Sheet for Healthcare Providers: https://hall.com/  This test is not yet approved or  cleared by the [place] FDA and has been authorized for detection and/or diagnosis of SARS-CoV-2 by FDA under an Emergency Use Authorization (EUA).  This EUA will remain in effect (meaning this test can be used) for the duration of the COVID-19 declaration under Section 564(b)(1) of the Act, 21 U.S.C. section 360bbb-3(b)(1), unless the authorization is terminated or revoked sooner.  Performed at [facility], [place] [address] [place], [place] [address]          Radiology Studies: ECHOCARDIOGRAM COMPLETE  Result Date: 01/06/2022    ECHOCARDIOGRAM REPORT   Patient Name:   LAKEN ROG Date of Exam: 01/06/2022 Medical Rec #:  [id number]         Height:       66.0 in Accession #:    [id number]        Weight:       253.5 lb Date of Birth:  1960/06/05          BSA:          2.212 m Patient Age:    69 years          BP:           158/62 mmHg Patient Gender: F                 HR:           96 bpm. Exam Location:  Inpatient Procedure: 2D Echo and Intracardiac Opacification Agent Indications:    elevated troponin  History:        Patient has prior history of Echocardiogram examinations, most                 recent 08/01/2019. Risk Factors:Hypertension and Dyslipidemia.  Sonographer:    Harvie Junior Referring Phys: [phone] Mutasim Tuckey V Gianfranco Araki  Sonographer Comments: Technically difficult study due to poor echo windows and patient is obese. Image acquisition challenging due to patient body habitus. IMPRESSIONS  1. Left ventricular ejection fraction, by estimation, is 60 to 65%. The left ventricle has normal function. The left ventricle has no regional wall motion abnormalities. There is mild concentric left ventricular hypertrophy. Left ventricular diastolic parameters are indeterminate.  2. Right ventricular systolic function is normal. The right ventricular size is normal. There is normal pulmonary artery systolic pressure.  3. The mitral valve is normal in structure. Trivial mitral valve regurgitation. No evidence of mitral stenosis.  4. Tricuspid aortic valve with focal calcification on the left cusp. Aortic valve regurgitation is not visualized. Aortic valve sclerosis/calcification is present, without any evidence of aortic stenosis.  5. The inferior vena cava is normal in size with greater than 50% respiratory variability, suggesting right atrial pressure of 3 mmHg. FINDINGS  Left Ventricle: Left ventricular ejection fraction, by estimation, is 60 to 65%. The left ventricle has normal function. The left ventricle  has no regional wall motion abnormalities. Definity contrast agent was given IV to delineate the left ventricular  endocardial borders. The left ventricular internal cavity size was normal in size. There is mild concentric left ventricular hypertrophy. Left ventricular  diastolic parameters are indeterminate. Right Ventricle: The right ventricular size is normal. No increase in right ventricular wall thickness. Right ventricular systolic function is normal. There is normal pulmonary artery systolic pressure. The tricuspid regurgitant velocity is 2.50 m/s, and  with an assumed right atrial pressure of 3 mmHg, the estimated right ventricular systolic pressure is 96.0 mmHg. Left Atrium: Left atrial size was normal in size. Right Atrium: Right atrial size was normal in size. Pericardium: Trivial pericardial effusion is present. The pericardial effusion is posterior to the left ventricle. Presence of epicardial fat layer. Mitral Valve: The mitral valve is normal in structure. There is mild thickening of the mitral valve leaflet(s). Mild mitral annular calcification. Trivial mitral valve regurgitation. No evidence of mitral valve stenosis. Tricuspid Valve: The tricuspid valve is normal in structure. Tricuspid valve regurgitation is mild . No evidence of tricuspid stenosis. Aortic Valve: Tricuspid aortic valve with focal calcification on the left cusp. Aortic valve regurgitation is not visualized. Aortic valve sclerosis/calcification is present, without any evidence of aortic stenosis. Aortic valve mean gradient measures 10.0 mmHg. Aortic valve peak gradient measures 17.8 mmHg. Aortic valve area, by VTI measures 1.81 cm. Pulmonic Valve: The pulmonic valve was normal in structure. Pulmonic valve regurgitation is trivial. No evidence of pulmonic stenosis. Aorta: The aortic root is normal in size and structure. Venous: The inferior vena cava is normal in size with greater than 50% respiratory variability, suggesting right atrial pressure of 3 mmHg. IAS/Shunts: No atrial level shunt detected by color flow Doppler.  LEFT VENTRICLE PLAX 2D LVIDd:         3.90 cm     Diastology LVIDs:         2.40 cm     LV e' medial:    5.98 cm/s LV PW:         1.00 cm     LV E/e' medial:  14.3 LV IVS:         1.10 cm     LV e' lateral:   8.05 cm/s LVOT diam:     1.90 cm     LV E/e' lateral: 10.6 LV SV:         67 LV SV Index:   30 LVOT Area:     2.84 cm  LV Volumes (MOD) LV vol d, MOD A2C: 49.7 ml LV vol d, MOD A4C: 81.2 ml LV vol s, MOD A2C: 19.3 ml LV vol s, MOD A4C: 25.7 ml LV SV MOD A2C:     30.4 ml LV SV MOD A4C:     81.2 ml LV SV MOD BP:      46.4 ml RIGHT VENTRICLE RV Basal diam:  2.70 cm RV Mid diam:    2.40 cm TAPSE (M-mode): 1.9 cm LEFT ATRIUM             Index       RIGHT ATRIUM          Index LA diam:        3.40 cm 1.54 cm/m  RA Area:     7.52 cm LA Vol (A2C):   18.8 ml 8.50 ml/m  RA Volume:   12.10 ml 5.47 ml/m LA Vol (A4C):   15.2 ml 6.87 ml/m LA Biplane Vol: 16.9 ml  7.64 ml/m  AORTIC VALVE                     PULMONIC VALVE AV Area (Vmax):    1.77 cm      PV Vmax:       1.54 m/s AV Area (Vmean):   1.59 cm      PV Peak grad:  9.5 mmHg AV Area (VTI):     1.81 cm AV Vmax:           211.00 cm/s AV Vmean:          144.000 cm/s AV VTI:            0.370 m AV Peak Grad:      17.8 mmHg AV Mean Grad:      10.0 mmHg LVOT Vmax:         132.00 cm/s LVOT Vmean:        81.000 cm/s LVOT VTI:          0.236 m LVOT/AV VTI ratio: 0.64  AORTA Ao Root diam: 2.70 cm Ao Asc diam:  2.70 cm MITRAL VALVE                TRICUSPID VALVE MV Area (PHT): 3.99 cm     TR Peak grad:   25.0 mmHg MV Decel Time: 190 msec     TR Vmax:        250.00 cm/s MV E velocity: 85.30 cm/s MV A velocity: 101.00 cm/s  SHUNTS MV E/A ratio:  0.84         Systemic VTI:  0.24 m                             Systemic Diam: 1.90 cm Kardie Tobb DO Electronically signed by Berniece Salines DO Signature Date/Time: 01/06/2022/11:26:43 AM    Final    CT ABDOMEN PELVIS WO CONTRAST  Result Date: 01/05/2022 CLINICAL DATA:  Vomiting and epigastric pain for 1 week, initial encounter EXAM: CT ABDOMEN AND PELVIS WITHOUT CONTRAST TECHNIQUE: Multidetector CT imaging of the abdomen and pelvis was performed following the standard protocol without IV contrast.  RADIATION DOSE REDUCTION: This exam was performed according to the departmental dose-optimization program which includes automated exposure control, adjustment of the mA and/or kV according to patient size and/or use of iterative reconstruction technique. COMPARISON:  07/30/2019 FINDINGS: Lower chest: No acute abnormality. Hepatobiliary: No focal liver abnormality is seen. Status post cholecystectomy. No biliary dilatation. Pancreas: Unremarkable. No pancreatic ductal dilatation or surrounding inflammatory changes. Spleen: Normal in size without focal abnormality. Adrenals/Urinary Tract: Adrenal glands are within normal limits. Kidneys show no renal calculi or obstructive changes. Small left renal cyst is noted stable from the prior exam. No further follow-up is recommended. No obstructive changes are seen. The bladder is decompressed. Stomach/Bowel: The appendix has been surgically removed. No obstructive or inflammatory changes of the colon are noted. Scattered diverticular changes noted. Small bowel and stomach are within normal limits with the exception of a small hiatal hernia. Vascular/Lymphatic: Aortic atherosclerosis. No enlarged abdominal or pelvic lymph nodes. Reproductive: Status post hysterectomy. No adnexal masses. Other: No abdominal wall hernia or abnormality. No abdominopelvic ascites. Musculoskeletal: Degenerative changes of lumbar spine are noted. Stable scarring in the anterior abdominal wall on the left is noted. IMPRESSION: Diverticulosis without diverticulitis. No acute abnormality noted. Electronically Signed   By: Inez Catalina M.D.   On: 01/05/2022 02:20   DG Chest  Port 1 View  Result Date: 01/05/2022 CLINICAL DATA:  Vomiting for 1 week, epigastric pain, and increasing shortness of breath. EXAM: PORTABLE CHEST 1 VIEW COMPARISON:  07/30/2019. FINDINGS: The heart size and mediastinal contours are within normal limits. There is mild atherosclerotic calcification of the aorta. Elevation of  the right diaphragm is noted. No consolidation, effusion, or pneumothorax. Degenerative changes are present in the thoracic spine. No acute osseous abnormality. IMPRESSION: No active disease. Electronically Signed   By: Brett Fairy M.D.   On: 01/05/2022 01:38        Scheduled Meds:  allopurinol  300 mg Oral Daily   amLODipine  5 mg Oral Daily   bisacodyl  10 mg Rectal Once   carvedilol  25 mg Oral BID   citalopram  40 mg Oral Daily   enoxaparin (LOVENOX) injection  60 mg Subcutaneous Q24H   fluticasone  1 spray Each Nare Daily   hydrOXYzine  25 mg Oral QHS   influenza vac split quadrivalent PF  0.5 mL Intramuscular Tomorrow-1000   insulin aspart  0-15 Units Subcutaneous TID WC   insulin glargine-yfgn  10 Units Subcutaneous QHS   pantoprazole (PROTONIX) IV  40 mg Intravenous Q12H   pneumococcal 20-valent conjugate vaccine  0.5 mL Intramuscular Tomorrow-1000   senna  1 tablet Oral BID   sodium chloride flush  3 mL Intravenous Q12H   sucralfate  1 g Oral TID WC & HS   Continuous Infusions:  sodium chloride 100 mL/hr at 01/06/22 1430     LOS: 0 days    Time spent: 40 minutes    Irine Seal, MD [facility]   To contact the attending provider between 7A-7P or the covering provider during after hours 7P-7A, please log into the web site [email or web address] and access using universal [place] password for that web site. If you do not have the password, please call the hospital operator.  01/06/2022, 6:19 PM

## 2022-01-07 DIAGNOSIS — N179 Acute kidney failure, unspecified: Secondary | ICD-10-CM | POA: Diagnosis not present

## 2022-01-07 DIAGNOSIS — E785 Hyperlipidemia, unspecified: Secondary | ICD-10-CM | POA: Diagnosis not present

## 2022-01-07 DIAGNOSIS — R112 Nausea with vomiting, unspecified: Secondary | ICD-10-CM | POA: Diagnosis not present

## 2022-01-07 DIAGNOSIS — E860 Dehydration: Secondary | ICD-10-CM | POA: Diagnosis not present

## 2022-01-07 LAB — CBC
HCT: 33.7 % — ABNORMAL LOW (ref 36.0–46.0)
Hemoglobin: 10.9 g/dL — ABNORMAL LOW (ref 12.0–15.0)
MCH: 29 pg (ref 26.0–34.0)
MCHC: 32.3 g/dL (ref 30.0–36.0)
MCV: 89.6 fL (ref 80.0–100.0)
Platelets: 345 10*3/uL (ref 150–400)
RBC: 3.76 MIL/uL — ABNORMAL LOW (ref 3.87–5.11)
RDW: 15.8 % — ABNORMAL HIGH (ref 11.5–15.5)
WBC: 5.2 10*3/uL (ref 4.0–10.5)
nRBC: 0 % (ref 0.0–0.2)

## 2022-01-07 LAB — RETICULOCYTES
Immature Retic Fract: 8.3 % (ref 2.3–15.9)
RBC.: 4.2 MIL/uL (ref 3.87–5.11)
Retic Count, Absolute: 45.4 10*3/uL (ref 19.0–186.0)
Retic Ct Pct: 1.1 % (ref 0.4–3.1)

## 2022-01-07 LAB — FERRITIN: Ferritin: 213 ng/mL (ref 11–307)

## 2022-01-07 LAB — COMPREHENSIVE METABOLIC PANEL
ALT: 76 U/L — ABNORMAL HIGH (ref 0–44)
AST: 76 U/L — ABNORMAL HIGH (ref 15–41)
Albumin: 3.2 g/dL — ABNORMAL LOW (ref 3.5–5.0)
Alkaline Phosphatase: 109 U/L (ref 38–126)
Anion gap: 6 (ref 5–15)
BUN: 6 mg/dL (ref 6–20)
CO2: 22 mmol/L (ref 22–32)
Calcium: 8.9 mg/dL (ref 8.9–10.3)
Chloride: 113 mmol/L — ABNORMAL HIGH (ref 98–111)
Creatinine, Ser: 0.85 mg/dL (ref 0.44–1.00)
GFR, Estimated: >60 mL/min (ref >60)
Glucose, Bld: 173 mg/dL — ABNORMAL HIGH (ref 70–99)
Potassium: 3.4 mmol/L — ABNORMAL LOW (ref 3.5–5.1)
Sodium: 141 mmol/L (ref 135–145)
Total Bilirubin: 0.7 mg/dL (ref 0.3–1.2)
Total Protein: 7.8 g/dL (ref 6.5–8.1)

## 2022-01-07 LAB — FOLATE: Folate: 12.4 ng/mL (ref >5.9)

## 2022-01-07 LAB — GLUCOSE, CAPILLARY
Glucose-Capillary: 225 mg/dL — ABNORMAL HIGH (ref 70–99)
Glucose-Capillary: 137 mg/dL — ABNORMAL HIGH (ref 70–99)
Glucose-Capillary: 229 mg/dL — ABNORMAL HIGH (ref 70–99)
Glucose-Capillary: 136 mg/dL — ABNORMAL HIGH (ref 70–99)

## 2022-01-07 LAB — MAGNESIUM: Magnesium: 1.9 mg/dL (ref 1.7–2.4)

## 2022-01-07 LAB — IRON AND TIBC
Iron: 94 ug/dL (ref 28–170)
Saturation Ratios: 28 % (ref 10.4–31.8)
TIBC: 342 ug/dL (ref 250–450)
UIBC: 248 ug/dL

## 2022-01-07 LAB — VITAMIN B12: Vitamin B-12: 487 pg/mL (ref 180–914)

## 2022-01-07 MED ORDER — INSULIN GLARGINE-YFGN 100 UNIT/ML ~~LOC~~ SOLN
15.0000 [IU] | Freq: Every day | SUBCUTANEOUS | Status: DC
  Administered 2022-01-07 – 2022-01-09 (×3): 15 [IU] via SUBCUTANEOUS
  Filled 2022-01-07 (×4): qty 0.15

## 2022-01-07 MED ORDER — LOSARTAN POTASSIUM 50 MG PO TABS
100.0000 mg | ORAL_TABLET | Freq: Every day | ORAL | Status: DC
  Administered 2022-01-07 – 2022-01-08 (×2): 100 mg via ORAL
  Filled 2022-01-07 (×2): qty 2

## 2022-01-07 MED ORDER — POTASSIUM CHLORIDE CRYS ER 20 MEQ PO TBCR
40.0000 meq | EXTENDED_RELEASE_TABLET | Freq: Once | ORAL | Status: AC
  Administered 2022-01-07: 40 meq via ORAL
  Filled 2022-01-07: qty 2

## 2022-01-07 NOTE — Progress Notes (Signed)
[facility]ology Progress Note  Tracey Castillo 61 y.o. 07/30/1960   Subjective: Sleeping comfortably but easily arousable. Abdominal pain a little better. Tolerating solid food. Denies N/V.  Objective: Vital signs: Vitals:   01/07/22 1052 01/07/22 1342  BP: (!) 152/68 130/61  Pulse: 73 75  Resp: 18 20  Temp:  97.9 F (36.6 C)  SpO2: 100% 100%    Physical Exam: Gen: lethargic, obese, no acute distress  HEENT: anicteric sclera CV: RRR Chest: CTA B Abd: diffuse tenderness with guarding, soft, nondistended, +BS, obese Ext: no edema  Lab Results: Recent Labs    01/05/22 1830 01/06/22 0435 01/07/22 0514  NA 139 141 141  K 3.9 3.6 3.4*  CL 105 111 113*  CO2 22 22 22   GLUCOSE 184* 138* 173*  BUN 11 8 6   CREATININE 0.90 0.82 0.85  CALCIUM 9.2 8.7* 8.9  MG 1.4* 2.3 1.9  PHOS 2.9  --   --    Recent Labs    01/06/22 0435 01/07/22 0514  AST 94* 76*  ALT 87* 76*  ALKPHOS 110 109  BILITOT 0.9 0.7  PROT 7.5 7.8  ALBUMIN 3.1* 3.2*   Recent Labs    01/05/22 1830 01/06/22 0435 01/07/22 0514  WBC 8.7 6.6 5.2  NEUTROABS 5.7 3.5  --   HGB 12.4 11.4* 10.9*  HCT 38.5 35.4* 33.7*  MCV 88.1 87.8 89.6  PLT 371 337 345      Assessment/Plan: Epigastric pain that is improving with Carafate and IV PPI. Likely dyspepsia. No plans for EGD since tolerating solid food. At d/c would do Carafate slurry (tablets if slurry not covered by insurance) QID for 10 days and continue PPI BID until f/u in 6-8 weeks. Will sign off. Call if questions.   Tracey Castillo 01/07/2022, 4:49 PM  Questions please call [phone] Patient ID: Tracey Castillo, female   DOB: 15-Feb-1961, 61 y.o.   MRN: [id number]

## 2022-01-07 NOTE — Progress Notes (Signed)
Mobility Specialist - Progress Note   01/07/22 1421  Mobility  Activity Ambulated independently in hallway  Level of Assistance Independent  Assistive Device None  Distance Ambulated (ft) 120 ft  Range of Motion/Exercises Active  Activity Response Tolerated well  Mobility Referral Yes  $Mobility charge 1 Mobility    Pt was found on recliner chair and agreeable to ambulate. Had no complaints during ambulation and at EOS returned to recliner chair with all necessities in reach.  Ferd Hibbs Mobility Specialist

## 2022-01-07 NOTE — Progress Notes (Signed)
PROGRESS NOTE    Tracey Castillo  GYI:[id number] DOB: 09/01/1960 DOA: 01/05/2022 PCP: [facility]    Chief Complaint  Patient presents with   Vomiting    Brief Narrative:  Patient 61 year old female history of insulin-dependent type 2 diabetes, hypertension, hyperlipidemia, GERD presented to [place] freestanding ED with a day history of intractable nausea vomiting, epigastric abdominal pain.  Patient noted to have a transaminitis, noted to be dehydrated, noted to have elevated blood pressure on presentation.  Patient admitted for further evaluation and work-up.   Assessment & Plan:   Principal Problem:   Intractable nausea and vomiting Active Problems:   Epigastric abdominal pain   SIRS (systemic inflammatory response syndrome) (HCC)   Transaminitis   Obesity, Class III, BMI 40-49.9 (morbid obesity) (HCC)   Dyslipidemia   GERD (gastroesophageal reflux disease)   MDD (major depressive disorder)   AKI (acute kidney injury) ([place])   Dehydration   Sinus tachycardia   Elevated liver function tests   Nausea and vomiting   Hypertension   Hypomagnesemia   Acute kidney injury (nontraumatic) (HCC)   Elevated lactic acid level  #1 intractable nausea vomiting epigastric abdominal pain -Patient presented with nausea vomiting x8 days, epigastric abdominal pain and on examination right upper quadrant pain. -Concern for gastritis versus peptic ulcer disease versus upper GI etiology. -Likely secondary to dyspepsia. -Patient noted on labs to have a transaminitis which is trending back down. -Patient with 20-year history of diabetes mellitus, insulin-dependent. -Patient denies any history of peptic ulcer disease. -CT abdomen and pelvis done with diverticulosis without diverticulitis, no acute abnormality noted, no biliary dilatation noted, status postcholecystectomy, no focal liver abnormality seen, unremarkable pancreas. -Labs on presentation in the ED noted a lactic  acidosis which improved with hydration, transaminitis, urinalysis with some glycosuria and ketonuria, initial comprehensive metabolic profile with a slight acidosis with bicarb of 20, anion gap of 16, glucose of 280. -Patient states last episode of emesis was on 01/04/2022.  -Improving clinically daily.   -Was on a clear liquid diet, advance to full liquid diet which she is tolerating.   -Advance to a soft diet.   -Due to patient's ongoing epigastric and right upper quadrant pain and nausea GI consulted for further evaluation and management.  -GI feels could likely be secondary to dyspepsia and as patient is improving clinically we will hold off on upper endoscopy at this time unless patient is unable to tolerate advancement of diet.   -Continue IV PPI every 12 hours.   -Sucralfate slurry 4 times daily added per GI.   -Supportive care.   -GI following and appreciate input and recommendations.     2.  Dehydration -Improved with hydration.   -Saline lock IV fluids.   3.  Transaminitis -?  Etiology. -Likely secondary to dehydration. -CT abdomen and pelvis with no acute abnormalities noted. -Acute hepatitis panel negative.   -LFTs trending down with fluid resuscitation.   -Continue to hold statin. - Saline lock IVF. -Supportive care. -Repeat labs in the AM.   4.  SIRS, ruled out -Patient met criteria of SIRS on presentation with tachycardia, elevated lactic acid level. -Blood cultures pending with no growth to date. -Chest x-ray with no acute abnormalities. -CT abdomen and pelvis with diverticulosis without diverticulitis, no acute abnormalities noted. -Urinalysis nitrite negative, leukocytes negative. -Supportive care.   -Hold off on antibiotics at this time.   5.  GERD -Continue IV PPI.   6.  Sinus tachycardia -Likely secondary to dehydration, nausea and  vomiting. -Patient also noted to have been on a beta-blocker prior to admission and had run out of medications and needed  refills. -Improved. -EKG with sinus tachycardia with no ischemic changes noted.  -TSH within normal limits at 0.527.  D-dimer slightly elevated however low probability for PE.   -Troponin noted at 22 >> 20, and flattened.  Patient denies any overt chest pain.   -2D echo with EF of 60 to 65%, NWMA.   -Saline lock IV fluids.   -IV Lopressor transition back to home regimen Coreg.   -Supportive care.     7.  Diabetes mellitus type 2, insulin-dependent -Hemoglobin A1c at 9.4.  -CBG 225 this morning.  -Increase Semglee to 15 units daily, SSI.   -Diabetes coordinator following.    8.  Hyperlipidemia -Continue to hold statin.   9.  Acute kidney injury -Likely secondary to prerenal azotemia in the setting of ongoing nausea and vomiting with poor oral intake in the setting of ARB. -Renal function improved with hydration. -Renal function improved. -ARB held and will resume.  -CT abdomen and pelvis with no obstructive changes noted, no renal calculi, bladder decompressed. -Saline lock IV fluids. -Repeat labs in the AM.   10.  Morbid obesity -Lifestyle modification -Outpatient follow-up with PCP.   11.  History of depressive disorder -Celexa.   12.  Hypertension -Due to acute kidney injury ARB was held on admission.  -Patient initially was on IV Lopressor and transition to English home regimen ARB.   -Discontinue Norvasc.   -IV hydralazine as needed.       DVT prophylaxis: Lovenox Code Status: Full Family Communication: Updated patient.  Updated sister and brother at bedside.   Disposition: Home once clinically improved, tolerating oral intake.  Status is: Observation The patient remains OBS appropriate and will d/c before 2 midnights.   Consultants:  Gastroenterology: Dr. Michail Sermon 01/06/2022  Procedures:  CT abdomen and pelvis 01/05/2022 Chest x-ray 01/05/2022  Antimicrobials:  None   Subjective: Sitting up in chair.  Overall feeling better.  Sister at  bedside.  Denies any chest pain.  No shortness of breath.  Nausea and vomiting improving.  Epigastric abdominal pain improving.  Tolerated full liquids this morning.    Objective: Vitals:   01/06/22 1242 01/06/22 2120 01/07/22 0457 01/07/22 0500  BP: (!) 154/69 138/66 136/64   Pulse: 88 75 73   Resp: _0 Temp: 97.7 F (36.5 C) 98.4 F (36.9 C) (!) 97.5 F (36.4 C)   TempSrc: Oral Oral Oral   SpO2: 100% 100% 100%   Weight:    116.9 kg  Height:        Intake/Output Summary (Last 24 hours) at 01/07/2022 1032 Last data filed at 01/07/2022 0438 Gross per 24 hour  Intake 3271.29 ml  Output 400 ml  Net 2871.29 ml    Filed Weights   01/04/22 2349 01/06/22 0446 01/07/22 0500  Weight: 117 kg 115 kg 116.9 kg    Examination:  General exam: NAD. Respiratory system: CTA B.  No wheezes, no crackles, no rhonchi.  Fair air movement.  Speaking in full sentences.  Cardiovascular system: RRR no murmurs rubs or gallops.  No JVD.  No lower extremity edema.  Gastrointestinal system: Abdomen is soft, nondistended, decreased tenderness to palpation in the epigastric region.  Positive bowel sounds.  No rebound.  No guarding.  Central nervous system: Alert and oriented. No focal neurological deficits. Extremities: Symmetric 5 x 5 power. Skin: No rashes,  lesions or ulcers Psychiatry: Judgement and insight appear normal. Mood & affect appropriate.     Data Reviewed: I have personally reviewed following labs and imaging studies  CBC: Recent Labs  Lab 01/05/22 0109 01/05/22 0246 01/05/22 1830 01/06/22 0435 01/07/22 0514  WBC 8.9  --  8.7 6.6 5.2  NEUTROABS 6.5  --  5.7 3.5  --   HGB 13.9 15.0 12.4 11.4* 10.9*  HCT 41.6 44.0 38.5 35.4* 33.7*  MCV 86.0  --  88.1 87.8 89.6  PLT 428*  --  371 337 345     Basic Metabolic Panel: Recent Labs  Lab 01/05/22 0109 01/05/22 0246 01/05/22 1830 01/06/22 0435 01/07/22 0514  NA 134* 137 139 141 141  K 4.9 3.7 3.9 3.6 3.4*  CL 99  --   105 111 113*  CO2 19*  --  _0 GLUCOSE 280*  --  184* 138* 173*  BUN 16  --  _1 CREATININE 1.40*  --  0.90 0.82 0.85  CALCIUM 9.7  --  9.2 8.7* 8.9  MG  --   --  1.4* 2.3 1.9  PHOS  --   --  2.9  --   --      GFR: Estimated Creatinine Clearance: 91.4 mL/min (by C-G formula based on SCr of 0.85 mg/dL).  Liver Function Tests: Recent Labs  Lab 01/05/22 0109 01/05/22 0336 01/05/22 1830 01/06/22 0435 01/07/22 0514  AST 147* 133* 102* 94* 76*  ALT 119* 114* 97* 87* 76*  ALKPHOS 155* 146* 125 110 109  BILITOT 1.4* 1.1 1.1 0.9 0.7  PROT 9.6* 9.2* 8.4* 7.5 7.8  ALBUMIN 3.8 3.4* 3.5 3.1* 3.2*     CBG: Recent Labs  Lab 01/06/22 0727 01/06/22 1130 01/06/22 1744 01/06/22 2122 01/07/22 0733  GLUCAP 138* 155* 155* 169* 225*      Recent Results (from the past 240 hour(s))  Culture, blood (Routine x 2)     Status: None (Preliminary result)   Collection Time: 01/05/22  1:00 AM   Specimen: BLOOD  Result Value Ref Range Status   Specimen Description   Final    BLOOD LEFT ANTECUBITAL Performed at [facility], [address] [place], [place] [address]    Special Requests   Final    BOTTLES DRAWN AEROBIC AND ANAEROBIC Blood Culture adequate volume Performed at [facility], [address] [place], [place] [address]    Culture   Final    NO GROWTH 2 DAYS Performed at [facility], [address], [place], [place] [address]    Report Status PENDING  Incomplete  Culture, blood (single)     Status: None (Preliminary result)   Collection Time: 01/05/22  9:34 AM   Specimen: BLOOD RIGHT HAND  Result Value Ref Range Status   Specimen Description   Final    BLOOD RIGHT HAND Performed at [facility], [address] [place], [place] [address]    Special Requests   Final    BOTTLES DRAWN AEROBIC AND ANAEROBIC Blood Culture adequate volume Performed at [facility], [address] [place], [place] [address]     Culture   Final    NO GROWTH 2 DAYS Performed at [facility], [place] [address], [place], [place] [address]    Report Status PENDING  Incomplete  SARS Coronavirus 2 by RT PCR (hospital order, performed in [facility] hospital  lab) *cepheid single result test* Anterior Nasal Swab     Status: None   Collection Time: 01/05/22  6:36 PM   Specimen: Anterior Nasal Swab  Result Value Ref Range Status   SARS Coronavirus 2 by RT PCR NEGATIVE NEGATIVE Final    Comment: (NOTE) SARS-CoV-2 target nucleic acids are NOT DETECTED.  The SARS-CoV-2 RNA is generally detectable in upper and lower respiratory specimens during the acute phase of infection. The lowest concentration of SARS-CoV-2 viral copies this assay can detect is 250 copies / mL. A negative result does not preclude SARS-CoV-2 infection and should not be used as the sole basis for treatment or other patient management decisions.  A negative result may occur with improper specimen collection / handling, submission of specimen other than nasopharyngeal swab, presence of viral mutation(s) within the areas targeted by this assay, and inadequate number of viral copies (<250 copies / mL). A negative result must be combined with clinical observations, patient history, and epidemiological information.  Fact Sheet for Patients:   https://www.patel.info/  Fact Sheet for Healthcare Providers: https://hall.com/  This test is not yet approved or  cleared by the [place] FDA and has been authorized for detection and/or diagnosis of SARS-CoV-2 by FDA under an Emergency Use Authorization (EUA).  This EUA will remain in effect (meaning this test can be used) for the duration of the COVID-19 declaration under Section 564(b)(1) of the Act, 21 U.S.C. section 360bbb-3(b)(1), unless the authorization is terminated or revoked sooner.  Performed at [facility], [employer] [address] [place], [place] [address]          Radiology Studies: ECHOCARDIOGRAM COMPLETE  Result Date: 01/06/2022    ECHOCARDIOGRAM REPORT   Patient Name:   ALABAMA DOIG Date of Exam: 01/06/2022 Medical Rec #:  [id number]         Height:       66.0 in Accession #:    [id number]        Weight:       253.5 lb Date of Birth:  [birth year]         BSA:          2.212 m Patient Age:    20 years          BP:           158/62 mmHg Patient Gender: F                 HR:           96 bpm. Exam Location:  Inpatient Procedure: 2D Echo and Intracardiac Opacification Agent Indications:    elevated troponin  History:        Patient has prior history of Echocardiogram examinations, most                 recent 08/01/2019. Risk Factors:Hypertension and Dyslipidemia.  Sonographer:    Harvie Junior Referring Phys: [phone] Johnathon Mittal V Monish Haliburton  Sonographer Comments: Technically difficult study due to poor echo windows and patient is obese. Image acquisition challenging due to patient body habitus. IMPRESSIONS  1. Left ventricular ejection fraction, by estimation, is 60 to 65%. The left ventricle has normal function. The left ventricle has no regional wall motion abnormalities. There is mild concentric left ventricular hypertrophy. Left ventricular diastolic parameters are indeterminate.  2. Right ventricular systolic function is normal. The right ventricular size is normal. There is normal pulmonary artery systolic pressure.  3. The mitral valve is normal in structure. Trivial mitral  valve regurgitation. No evidence of mitral stenosis.  4. Tricuspid aortic valve with focal calcification on the left cusp. Aortic valve regurgitation is not visualized. Aortic valve sclerosis/calcification is present, without any evidence of aortic stenosis.  5. The inferior vena cava is normal in size with greater than 50% respiratory variability, suggesting right atrial pressure of 3 mmHg. FINDINGS  Left Ventricle: Left ventricular ejection fraction, by  estimation, is 60 to 65%. The left ventricle has normal function. The left ventricle has no regional wall motion abnormalities. Definity contrast agent was given IV to delineate the left ventricular  endocardial borders. The left ventricular internal cavity size was normal in size. There is mild concentric left ventricular hypertrophy. Left ventricular diastolic parameters are indeterminate. Right Ventricle: The right ventricular size is normal. No increase in right ventricular wall thickness. Right ventricular systolic function is normal. There is normal pulmonary artery systolic pressure. The tricuspid regurgitant velocity is 2.50 m/s, and  with an assumed right atrial pressure of 3 mmHg, the estimated right ventricular systolic pressure is 72.0 mmHg. Left Atrium: Left atrial size was normal in size. Right Atrium: Right atrial size was normal in size. Pericardium: Trivial pericardial effusion is present. The pericardial effusion is posterior to the left ventricle. Presence of epicardial fat layer. Mitral Valve: The mitral valve is normal in structure. There is mild thickening of the mitral valve leaflet(s). Mild mitral annular calcification. Trivial mitral valve regurgitation. No evidence of mitral valve stenosis. Tricuspid Valve: The tricuspid valve is normal in structure. Tricuspid valve regurgitation is mild . No evidence of tricuspid stenosis. Aortic Valve: Tricuspid aortic valve with focal calcification on the left cusp. Aortic valve regurgitation is not visualized. Aortic valve sclerosis/calcification is present, without any evidence of aortic stenosis. Aortic valve mean gradient measures 10.0 mmHg. Aortic valve peak gradient measures 17.8 mmHg. Aortic valve area, by VTI measures 1.81 cm. Pulmonic Valve: The pulmonic valve was normal in structure. Pulmonic valve regurgitation is trivial. No evidence of pulmonic stenosis. Aorta: The aortic root is normal in size and structure. Venous: The inferior vena cava  is normal in size with greater than 50% respiratory variability, suggesting right atrial pressure of 3 mmHg. IAS/Shunts: No atrial level shunt detected by color flow Doppler.  LEFT VENTRICLE PLAX 2D LVIDd:         3.90 cm     Diastology LVIDs:         2.40 cm     LV e' medial:    5.98 cm/s LV PW:         1.00 cm     LV E/e' medial:  14.3 LV IVS:        1.10 cm     LV e' lateral:   8.05 cm/s LVOT diam:     1.90 cm     LV E/e' lateral: 10.6 LV SV:         67 LV SV Index:   30 LVOT Area:     2.84 cm  LV Volumes (MOD) LV vol d, MOD A2C: 49.7 ml LV vol d, MOD A4C: 81.2 ml LV vol s, MOD A2C: 19.3 ml LV vol s, MOD A4C: 25.7 ml LV SV MOD A2C:     30.4 ml LV SV MOD A4C:     81.2 ml LV SV MOD BP:      46.4 ml RIGHT VENTRICLE RV Basal diam:  2.70 cm RV Mid diam:    2.40 cm TAPSE (M-mode): 1.9 cm LEFT ATRIUM  Index       RIGHT ATRIUM          Index LA diam:        3.40 cm 1.54 cm/m  RA Area:     7.52 cm LA Vol (A2C):   18.8 ml 8.50 ml/m  RA Volume:   12.10 ml 5.47 ml/m LA Vol (A4C):   15.2 ml 6.87 ml/m LA Biplane Vol: 16.9 ml 7.64 ml/m  AORTIC VALVE                     PULMONIC VALVE AV Area (Vmax):    1.77 cm      PV Vmax:       1.54 m/s AV Area (Vmean):   1.59 cm      PV Peak grad:  9.5 mmHg AV Area (VTI):     1.81 cm AV Vmax:           211.00 cm/s AV Vmean:          144.000 cm/s AV VTI:            0.370 m AV Peak Grad:      17.8 mmHg AV Mean Grad:      10.0 mmHg LVOT Vmax:         132.00 cm/s LVOT Vmean:        81.000 cm/s LVOT VTI:          0.236 m LVOT/AV VTI ratio: 0.64  AORTA Ao Root diam: 2.70 cm Ao Asc diam:  2.70 cm MITRAL VALVE                TRICUSPID VALVE MV Area (PHT): 3.99 cm     TR Peak grad:   25.0 mmHg MV Decel Time: 190 msec     TR Vmax:        250.00 cm/s MV E velocity: 85.30 cm/s MV A velocity: 101.00 cm/s  SHUNTS MV E/A ratio:  0.84         Systemic VTI:  0.24 m                             Systemic Diam: 1.90 cm Kardie Tobb DO Electronically signed by Berniece Salines DO Signature  Date/Time: 01/06/2022/11:26:43 AM    Final         Scheduled Meds:  allopurinol  300 mg Oral Daily   bisacodyl  10 mg Rectal Once   carvedilol  25 mg Oral BID   citalopram  40 mg Oral Daily   enoxaparin (LOVENOX) injection  60 mg Subcutaneous Q24H   fluticasone  1 spray Each Nare Daily   hydrOXYzine  25 mg Oral QHS   influenza vac split quadrivalent PF  0.5 mL Intramuscular Tomorrow-1000   insulin aspart  0-15 Units Subcutaneous TID WC   insulin glargine-yfgn  10 Units Subcutaneous QHS   losartan  100 mg Oral Daily   pantoprazole (PROTONIX) IV  40 mg Intravenous Q12H   pneumococcal 20-valent conjugate vaccine  0.5 mL Intramuscular Tomorrow-1000   senna  1 tablet Oral BID   sodium chloride flush  3 mL Intravenous Q12H   sucralfate  1 g Oral TID WC & HS   Continuous Infusions:     LOS: 0 days    Time spent: 40 minutes    Irine Seal, MD [facility]   To contact the attending provider between 7A-7P or the covering provider during after hours 7P-7A,  please log into the web site [email or web address] and access using universal [place] password for that web site. If you do not have the password, please call the hospital operator.  01/07/2022, 10:32 AM

## 2022-01-08 DIAGNOSIS — K5730 Diverticulosis of large intestine without perforation or abscess without bleeding: Secondary | ICD-10-CM | POA: Diagnosis present

## 2022-01-08 DIAGNOSIS — R1013 Epigastric pain: Secondary | ICD-10-CM | POA: Diagnosis present

## 2022-01-08 DIAGNOSIS — F418 Other specified anxiety disorders: Secondary | ICD-10-CM | POA: Diagnosis not present

## 2022-01-08 DIAGNOSIS — F419 Anxiety disorder, unspecified: Secondary | ICD-10-CM | POA: Diagnosis present

## 2022-01-08 DIAGNOSIS — E119 Type 2 diabetes mellitus without complications: Secondary | ICD-10-CM | POA: Diagnosis present

## 2022-01-08 DIAGNOSIS — R7989 Other specified abnormal findings of blood chemistry: Secondary | ICD-10-CM | POA: Diagnosis present

## 2022-01-08 DIAGNOSIS — R81 Glycosuria: Secondary | ICD-10-CM | POA: Diagnosis present

## 2022-01-08 DIAGNOSIS — F329 Major depressive disorder, single episode, unspecified: Secondary | ICD-10-CM | POA: Diagnosis present

## 2022-01-08 DIAGNOSIS — K2900 Acute gastritis without bleeding: Secondary | ICD-10-CM | POA: Diagnosis present

## 2022-01-08 DIAGNOSIS — D649 Anemia, unspecified: Secondary | ICD-10-CM | POA: Diagnosis not present

## 2022-01-08 DIAGNOSIS — Z1152 Encounter for screening for COVID-19: Secondary | ICD-10-CM | POA: Diagnosis not present

## 2022-01-08 DIAGNOSIS — R112 Nausea with vomiting, unspecified: Secondary | ICD-10-CM | POA: Diagnosis not present

## 2022-01-08 DIAGNOSIS — I10 Essential (primary) hypertension: Secondary | ICD-10-CM | POA: Diagnosis present

## 2022-01-08 DIAGNOSIS — Z6841 Body Mass Index (BMI) 40.0 and over, adult: Secondary | ICD-10-CM | POA: Diagnosis not present

## 2022-01-08 DIAGNOSIS — R109 Unspecified abdominal pain: Secondary | ICD-10-CM | POA: Diagnosis present

## 2022-01-08 DIAGNOSIS — R6510 Systemic inflammatory response syndrome (SIRS) of non-infectious origin without acute organ dysfunction: Secondary | ICD-10-CM | POA: Diagnosis present

## 2022-01-08 DIAGNOSIS — E7800 Pure hypercholesterolemia, unspecified: Secondary | ICD-10-CM | POA: Diagnosis present

## 2022-01-08 DIAGNOSIS — Z90710 Acquired absence of both cervix and uterus: Secondary | ICD-10-CM | POA: Diagnosis not present

## 2022-01-08 DIAGNOSIS — E785 Hyperlipidemia, unspecified: Secondary | ICD-10-CM | POA: Diagnosis not present

## 2022-01-08 DIAGNOSIS — K5900 Constipation, unspecified: Secondary | ICD-10-CM | POA: Diagnosis present

## 2022-01-08 DIAGNOSIS — E860 Dehydration: Secondary | ICD-10-CM | POA: Diagnosis present

## 2022-01-08 DIAGNOSIS — Z79899 Other long term (current) drug therapy: Secondary | ICD-10-CM | POA: Diagnosis not present

## 2022-01-08 DIAGNOSIS — Z794 Long term (current) use of insulin: Secondary | ICD-10-CM | POA: Diagnosis not present

## 2022-01-08 DIAGNOSIS — M109 Gout, unspecified: Secondary | ICD-10-CM | POA: Diagnosis present

## 2022-01-08 DIAGNOSIS — E872 Acidosis, unspecified: Secondary | ICD-10-CM | POA: Diagnosis present

## 2022-01-08 DIAGNOSIS — N179 Acute kidney failure, unspecified: Secondary | ICD-10-CM | POA: Diagnosis present

## 2022-01-08 DIAGNOSIS — K219 Gastro-esophageal reflux disease without esophagitis: Secondary | ICD-10-CM | POA: Diagnosis present

## 2022-01-08 DIAGNOSIS — R7401 Elevation of levels of liver transaminase levels: Secondary | ICD-10-CM | POA: Diagnosis present

## 2022-01-08 LAB — CBC
HCT: 34.2 % — ABNORMAL LOW (ref 36.0–46.0)
Hemoglobin: 10.9 g/dL — ABNORMAL LOW (ref 12.0–15.0)
MCH: 28.8 pg (ref 26.0–34.0)
MCHC: 31.9 g/dL (ref 30.0–36.0)
MCV: 90.2 fL (ref 80.0–100.0)
Platelets: 306 10*3/uL (ref 150–400)
RBC: 3.79 MIL/uL — ABNORMAL LOW (ref 3.87–5.11)
RDW: 15.8 % — ABNORMAL HIGH (ref 11.5–15.5)
WBC: 5.7 10*3/uL (ref 4.0–10.5)
nRBC: 0 % (ref 0.0–0.2)

## 2022-01-08 LAB — COMPREHENSIVE METABOLIC PANEL
ALT: 64 U/L — ABNORMAL HIGH (ref 0–44)
AST: 60 U/L — ABNORMAL HIGH (ref 15–41)
Albumin: 3.1 g/dL — ABNORMAL LOW (ref 3.5–5.0)
Alkaline Phosphatase: 110 U/L (ref 38–126)
Anion gap: 6 (ref 5–15)
BUN: 6 mg/dL (ref 6–20)
CO2: 22 mmol/L (ref 22–32)
Calcium: 9.1 mg/dL (ref 8.9–10.3)
Chloride: 113 mmol/L — ABNORMAL HIGH (ref 98–111)
Creatinine, Ser: 1.4 mg/dL — ABNORMAL HIGH (ref 0.44–1.00)
GFR, Estimated: 43 mL/min — ABNORMAL LOW (ref >60)
Glucose, Bld: 176 mg/dL — ABNORMAL HIGH (ref 70–99)
Potassium: 4 mmol/L (ref 3.5–5.1)
Sodium: 141 mmol/L (ref 135–145)
Total Bilirubin: 0.7 mg/dL (ref 0.3–1.2)
Total Protein: 7.3 g/dL (ref 6.5–8.1)

## 2022-01-08 LAB — MAGNESIUM: Magnesium: 1.7 mg/dL (ref 1.7–2.4)

## 2022-01-08 LAB — GLUCOSE, CAPILLARY
Glucose-Capillary: 161 mg/dL — ABNORMAL HIGH (ref 70–99)
Glucose-Capillary: 200 mg/dL — ABNORMAL HIGH (ref 70–99)
Glucose-Capillary: 212 mg/dL — ABNORMAL HIGH (ref 70–99)
Glucose-Capillary: 140 mg/dL — ABNORMAL HIGH (ref 70–99)

## 2022-01-08 MED ORDER — SORBITOL 70 % SOLN
960.0000 mL | TOPICAL_OIL | Freq: Once | ORAL | Status: AC
  Administered 2022-01-08: 960 mL via RECTAL
  Filled 2022-01-08: qty 240

## 2022-01-08 MED ORDER — POLYETHYLENE GLYCOL 3350 17 G PO PACK
17.0000 g | PACK | Freq: Two times a day (BID) | ORAL | Status: DC
  Administered 2022-01-08 – 2022-01-10 (×4): 17 g via ORAL
  Filled 2022-01-08 (×4): qty 1

## 2022-01-08 MED ORDER — SENNOSIDES-DOCUSATE SODIUM 8.6-50 MG PO TABS
1.0000 | ORAL_TABLET | Freq: Two times a day (BID) | ORAL | Status: DC
  Administered 2022-01-08 – 2022-01-10 (×5): 1 via ORAL
  Filled 2022-01-08 (×5): qty 1

## 2022-01-08 MED ORDER — MAGNESIUM SULFATE 2 GM/50ML IV SOLN
2.0000 g | Freq: Once | INTRAVENOUS | Status: AC
  Administered 2022-01-08: 2 g via INTRAVENOUS
  Filled 2022-01-08: qty 50

## 2022-01-08 NOTE — Progress Notes (Signed)
Mobility Specialist - Progress Note   01/08/22 1231  Mobility  Activity Ambulated with assistance in hallway  Level of Assistance Contact guard assist, steadying assist  Assistive Device Other (Comment) (Hallway Rails, HHA)  Distance Ambulated (ft) 120 ft  Range of Motion/Exercises Active  Activity Response Tolerated well  Mobility Referral Yes  $Mobility charge 1 Mobility   Pt was found on recliner chair and agreeable to ambulate. Stated getting fatigued during ambulation and at EOS returned to recliner chair with all necessities in reach. Chair alarm on.  Ferd Hibbs Mobility Specialist

## 2022-01-08 NOTE — Progress Notes (Signed)
[facility] Progress Note  Tracey Castillo 61 y.o. 10/15/1960   Subjective: Lying in bed comfortably in no distress. Reports diffuse abdominal pain is 9 out of 10 in intensity with 10 being the worse pain in her life. Denies N/V. Reports loose stool today. Tolerating soft food.   Objective: Vital signs: Vitals:   01/08/22 1035 01/08/22 1335  BP: (!) 116/55 (!) 112/59  Pulse: 71 69  Resp: 16 14  Temp:  97.6 F (36.4 Castillo)  SpO2: 100% 99%    Physical Exam: Gen: lethargic, elderly, obese, no acute distress  HEENT: anicteric sclera CV: RRR Chest: CTA B Abd: diffuse tenderness with guarding, soft, nondistended, +BS Ext: no edema  Lab Results: Recent Labs    01/05/22 1830 01/06/22 0435 01/07/22 0514 01/08/22 0523  NA 139   < > 141 141  K 3.9   < > 3.4* 4.0  CL 105   < > 113* 113*  CO2 22   < > 22 22  GLUCOSE 184*   < > 173* 176*  BUN 11   < > 6 6  CREATININE 0.90   < > 0.85 1.40*  CALCIUM 9.2   < > 8.9 9.1  MG 1.4*   < > 1.9 1.7  PHOS 2.9  --   --   --    < > = values in this interval not displayed.   Recent Labs    01/07/22 0514 01/08/22 0523  AST 76* 60*  ALT 76* 64*  ALKPHOS 109 110  BILITOT 0.7 0.7  PROT 7.8 7.3  ALBUMIN 3.2* 3.1*   Recent Labs    01/05/22 1830 01/06/22 0435 01/07/22 0514 01/08/22 0523  WBC 8.7 6.6 5.2 5.7  NEUTROABS 5.7 3.5  --   --   HGB 12.4 11.4* 10.9* 10.9*  HCT 38.5 35.4* 33.7* 34.2*  MCV 88.1 87.8 89.6 90.2  PLT 371 337 345 306      Assessment/Plan: Generalized abdominal pain and N/V prior to admit. Her reported pain is not consistent with her demeanor and question if pain seeking but defer to Dr. Thompson. Would stop IV pain meds and minimize oral narcotic pain meds especially with constipation. NPO p MN. EGD tomorrow to evaluate for peptic ulcer disease and will do gastric biopsies for H. Pylori. Supportive care.   Tracey Castillo Tracey Castillo 01/08/2022, 6:02 PM  Questions please call [phone] Patient ID:  Tracey Castillo, female   DOB: 05/12/1960, 61 y.o.   MRN: [id number]  

## 2022-01-08 NOTE — Progress Notes (Addendum)
PROGRESS NOTE    Tracey Castillo  FUX:[id number] DOB: July 08, 1960 DOA: 01/05/2022 PCP: [facility]    Chief Complaint  Patient presents with   Vomiting    Brief Narrative:  Patient 61 year old female history of insulin-dependent type 2 diabetes, hypertension, hyperlipidemia, GERD presented to [place] freestanding ED with a day history of intractable nausea vomiting, epigastric abdominal pain.  Patient noted to have a transaminitis, noted to be dehydrated, noted to have elevated blood pressure on presentation.  Patient admitted for further evaluation and work-up.   Assessment & Plan:   Principal Problem:   Intractable nausea and vomiting Active Problems:   Epigastric abdominal pain   SIRS (systemic inflammatory response syndrome) (HCC)   Transaminitis   Dyspepsia   Obesity, Class III, BMI 40-49.9 (morbid obesity) (HCC)   Dyslipidemia   GERD (gastroesophageal reflux disease)   MDD (major depressive disorder)   AKI (acute kidney injury) ([place])   Dehydration   Sinus tachycardia   Elevated liver function tests   Nausea and vomiting   Hypertension   Hypomagnesemia   Acute kidney injury (nontraumatic) (HCC)   Elevated lactic acid level  #1 intractable nausea vomiting epigastric abdominal pain -Patient presented with nausea vomiting x8 days, epigastric abdominal pain and on examination right upper quadrant pain. -Concern for gastritis versus peptic ulcer disease versus upper GI etiology. -Likely secondary to dyspepsia.  Patient also with complaints of constipation x2 weeks. -Patient noted on labs to have a transaminitis which is trending back down. -Patient with 20-year history of diabetes mellitus, insulin-dependent. -Patient denies any history of peptic ulcer disease. -CT abdomen and pelvis done with diverticulosis without diverticulitis, no acute abnormality noted, no biliary dilatation noted, status postcholecystectomy, no focal liver abnormality seen,  unremarkable pancreas. -Labs on presentation in the ED noted a lactic acidosis which improved with hydration, transaminitis, urinalysis with some glycosuria and ketonuria, initial comprehensive metabolic profile with a slight acidosis with bicarb of 20, anion gap of 16, glucose of 280. -Patient states last episode of emesis was on 01/04/2022.  -Noted to be improving as patient with no further nausea or vomiting and tolerating current full liquid diet. -Patient noted to have required 12 mg of IV morphine since around 2 AM this morning and noted to have required 12 mg of IV morphine yesterday for pain. -Patient started on bowel regimen of morphine twice daily, Senokot-S twice daily and SMOG enema x1 ordered as patient with complaints of constipation which may be contributing to patient's symptoms. -Discontinue IV morphine and monitor on oral pain medications. -Due to patient's ongoing epigastric and right upper quadrant pain and nausea GI consulted for further evaluation and management.  -GI feels could likely be secondary to dyspepsia and as patient is improving clinically we will hold off on upper endoscopy at this time unless patient is unable to tolerate advancement of diet.   -Continue IV PPI every 12 hours.   -Sucralfate slurry 4 times daily added per GI.   -Supportive care.   -GI following and appreciate input and recommendations.     2.  Dehydration -Improved with hydration.   -Saline lock IV fluids.   3.  Transaminitis -?  Etiology. -Likely secondary to dehydration. -CT abdomen and pelvis with no acute abnormalities noted. -Acute hepatitis panel negative.   -LFTs trending down with fluid resuscitation.   -Continue to hold statin. - Saline lock IVF. -Supportive care. -Repeat labs in the AM.   4.  SIRS, ruled out -Patient met criteria of SIRS on  presentation with tachycardia, elevated lactic acid level. -Blood cultures pending with no growth to date. -Chest x-ray with no acute  abnormalities. -CT abdomen and pelvis with diverticulosis without diverticulitis, no acute abnormalities noted. -Urinalysis nitrite negative, leukocytes negative. -Supportive care.   -Hold off on antibiotics at this time.   5.  GERD -Continue IV PPI.     6.  Sinus tachycardia -Likely secondary to dehydration, nausea and vomiting. -Patient also noted to have been on a beta-blocker prior to admission and had run out of medications and needed refills. -Sinus tachycardia improved. -EKG with sinus tachycardia with no ischemic changes noted.  -TSH within normal limits at 0.527.  D-dimer slightly elevated however low probability for PE.   -Troponin noted at 22 >> 20, and flattened.  Patient denies any overt chest pain.   -2D echo with EF of 60 to 65%, NWMA.   -Saline lock IV fluids.   -IV Lopressor has been transition to home regimen Coreg.   -Supportive care.     7.  Diabetes mellitus type 2, insulin-dependent -Hemoglobin A1c at 9.4.  -CBG 161 this morning.  -Continue Semglee 15 units daily.  SSI.   -Diabetes coordinator following.     8.  Hyperlipidemia -Continue to hold statin.    9.  Acute kidney injury -Likely secondary to prerenal azotemia in the setting of ongoing nausea and vomiting with poor oral intake in the setting of ARB. -Renal function improved with hydration. -Renal function improved. -ARB initially held and subsequently resumed.  -CT abdomen and pelvis with no obstructive changes noted, no renal calculi, bladder decompressed. -Saline lock IV fluids. -Repeat labs in the AM.   10.  Morbid obesity -Lifestyle modification -Outpatient follow-up with PCP.   11.  History of depressive disorder -Continue Celexa.   12.  Hypertension -Due to acute kidney injury ARB was held on admission.  -Patient initially was on IV Lopressor and transitioned to [place] home regimen ARB.   -Norvasc discontinued.   -IV hydralazine as needed.  13.  Constipation -Patient  with complaints of constipation, states no bowel movement in 2 weeks.   -Place on MiraLAX twice daily, Senokot-S twice daily.   -SMOG enema x1.      DVT prophylaxis: Lovenox Code Status: Full Family Communication: Updated patient.  No family at bedside.   Disposition: Home once clinically improved, tolerating oral intake.  Status is: Inpatient    Consultants:  Gastroenterology: Dr. Michail Sermon 01/06/2022  Procedures:  CT abdomen and pelvis 01/05/2022 Chest x-ray 01/05/2022  Antimicrobials:  None   Subjective: Sitting up at the side of the bed.  Tolerating full liquid diet.  Denies any nausea or emesis.  Still with complaints of epigastric and right upper quadrant pain.  Noted to have received 12 mg of IV morphine since 2:18 AM this morning.   Noted to have received 12 mg of IV morphine yesterday.   Patient states has not had a bowel movement in over 2 weeks.    Objective: Vitals:   01/07/22 2041 01/08/22 0444 01/08/22 0500 01/08/22 1035  BP: (!) 132/52 (!) 148/76  (!) 116/55  Pulse: 76 75  71  Resp: _0 Temp: 98 F (36.7 C) (!) 97.3 F (36.3 C)    TempSrc:  Oral    SpO2: 99% 100%  100%  Weight:   118 kg   Height:        Intake/Output Summary (Last 24 hours) at 01/08/2022 1202 Last data filed at 01/08/2022  5035 Gross per 24 hour  Intake 360 ml  Output 600 ml  Net -240 ml    Filed Weights   01/06/22 0446 01/07/22 0500 01/08/22 0500  Weight: 115 kg 116.9 kg 118 kg    Examination:  General exam: NAD. Respiratory system: Lungs clear to auscultation bilaterally.  No wheezes, no crackles, no rhonchi.  Fair air movement.  Speaking in full sentences.   Cardiovascular system: Regular rate rhythm no murmurs rubs or gallops.  No JVD.  No lower extremity edema.  Gastrointestinal system: Abdomen is soft, nondistended, some tenderness to palpation in the epigastric and right upper quadrant.  Positive bowel sounds.  No rebound.  No guarding.  Central nervous  system: Alert and oriented. No focal neurological deficits. Extremities: Symmetric 5 x 5 power. Skin: No rashes, lesions or ulcers Psychiatry: Judgement and insight appear normal. Mood & affect appropriate.     Data Reviewed: I have personally reviewed following labs and imaging studies  CBC: Recent Labs  Lab 01/05/22 0109 01/05/22 0246 01/05/22 1830 01/06/22 0435 01/07/22 0514 01/08/22 0523  WBC 8.9  --  8.7 6.6 5.2 5.7  NEUTROABS 6.5  --  5.7 3.5  --   --   HGB 13.9 15.0 12.4 11.4* 10.9* 10.9*  HCT 41.6 44.0 38.5 35.4* 33.7* 34.2*  MCV 86.0  --  88.1 87.8 89.6 90.2  PLT 428*  --  371 337 345 306     Basic Metabolic Panel: Recent Labs  Lab 01/05/22 0109 01/05/22 0246 01/05/22 1830 01/06/22 0435 01/07/22 0514 01/08/22 0523  NA 134* 137 139 141 141 141  K 4.9 3.7 3.9 3.6 3.4* 4.0  CL 99  --  105 111 113* 113*  CO2 19*  --  _0 GLUCOSE 280*  --  184* 138* 173* 176*  BUN 16  --  _1 CREATININE 1.40*  --  0.90 0.82 0.85 1.40*  CALCIUM 9.7  --  9.2 8.7* 8.9 9.1  MG  --   --  1.4* 2.3 1.9 1.7  PHOS  --   --  2.9  --   --   --      GFR: Estimated Creatinine Clearance: 55.9 mL/min (A) (by C-G formula based on SCr of 1.4 mg/dL (H)).  Liver Function Tests: Recent Labs  Lab 01/05/22 0336 01/05/22 1830 01/06/22 0435 01/07/22 0514 01/08/22 0523  AST 133* 102* 94* 76* 60*  ALT 114* 97* 87* 76* 64*  ALKPHOS 146* 125 110 109 110  BILITOT 1.1 1.1 0.9 0.7 0.7  PROT 9.2* 8.4* 7.5 7.8 7.3  ALBUMIN 3.4* 3.5 3.1* 3.2* 3.1*     CBG: Recent Labs  Lab 01/07/22 0733 01/07/22 1205 01/07/22 1726 01/07/22 2039 01/08/22 0741  GLUCAP 225* 137* 229* 136* 161*      Recent Results (from the past 240 hour(s))  Culture, blood (Routine x 2)     Status: None (Preliminary result)   Collection Time: 01/05/22  1:00 AM   Specimen: BLOOD  Result Value Ref Range Status   Specimen Description   Final    BLOOD LEFT ANTECUBITAL Performed at [facility], [address] [place], [place] [address]    Special Requests   Final    BOTTLES DRAWN AEROBIC AND ANAEROBIC Blood Culture adequate volume Performed at [facility], [address] [place], [place] [address]    Culture   Final    NO GROWTH 3 DAYS  Performed at [facility], [place] [address], [place], [place] [address]    Report Status PENDING  Incomplete  Culture, blood (single)     Status: None (Preliminary result)   Collection Time: 01/05/22  9:34 AM   Specimen: BLOOD RIGHT HAND  Result Value Ref Range Status   Specimen Description   Final    BLOOD RIGHT HAND Performed at [facility], [address] [place], [place] [address]    Special Requests   Final    BOTTLES DRAWN AEROBIC AND ANAEROBIC Blood Culture adequate volume Performed at [facility], [address] [place], [place] [address]    Culture   Final    NO GROWTH 3 DAYS Performed at [facility], [place] [address], [place], [place] [address]    Report Status PENDING  Incomplete  SARS Coronavirus 2 by RT PCR (hospital order, performed in [facility] hospital lab) *cepheid single result test* Anterior Nasal Swab     Status: None   Collection Time: 01/05/22  6:36 PM   Specimen: Anterior Nasal Swab  Result Value Ref Range Status   SARS Coronavirus 2 by RT PCR NEGATIVE NEGATIVE Final    Comment: (NOTE) SARS-CoV-2 target nucleic acids are NOT DETECTED.  The SARS-CoV-2 RNA is generally detectable in upper and lower respiratory specimens during the acute phase of infection. The lowest concentration of SARS-CoV-2 viral copies this assay can detect is 250 copies / mL. A negative result does not preclude SARS-CoV-2 infection and should not be used as the sole basis for treatment or other patient management decisions.  A negative result may occur with improper specimen collection / handling, submission of specimen other than nasopharyngeal swab, presence of viral mutation(s)  within the areas targeted by this assay, and inadequate number of viral copies (<250 copies / mL). A negative result must be combined with clinical observations, patient history, and epidemiological information.  Fact Sheet for Patients:   https://www.patel.info/  Fact Sheet for Healthcare Providers: https://hall.com/  This test is not yet approved or  cleared by the [place] FDA and has been authorized for detection and/or diagnosis of SARS-CoV-2 by FDA under an Emergency Use Authorization (EUA).  This EUA will remain in effect (meaning this test can be used) for the duration of the COVID-19 declaration under Section 564(b)(1) of the Act, 21 U.S.C. section 360bbb-3(b)(1), unless the authorization is terminated or revoked sooner.  Performed at [facility], [place] [address], [place], [place] [address]          Radiology Studies: No results found.      Scheduled Meds:  allopurinol  300 mg Oral Daily   bisacodyl  10 mg Rectal Once   carvedilol  25 mg Oral BID   citalopram  40 mg Oral Daily   enoxaparin (LOVENOX) injection  60 mg Subcutaneous Q24H   fluticasone  1 spray Each Nare Daily   hydrOXYzine  25 mg Oral QHS   influenza vac split quadrivalent PF  0.5 mL Intramuscular Tomorrow-1000   insulin aspart  0-15 Units Subcutaneous TID WC   insulin glargine-yfgn  15 Units Subcutaneous QHS   losartan  100 mg Oral Daily   pantoprazole (PROTONIX) IV  40 mg Intravenous Q12H   pneumococcal 20-valent conjugate vaccine  0.5 mL Intramuscular Tomorrow-1000   senna  1 tablet Oral BID   sodium chloride flush  3 mL Intravenous Q12H   sucralfate  1 g Oral TID WC &  HS   Continuous Infusions:     LOS: 0 days    Time spent: 40 minutes    Irine Seal, MD [facility]   To contact the attending provider between 7A-7P or the covering provider during after hours 7P-7A, please log into the web site  [email or web address] and access using universal [place] password for that web site. If you do not have the password, please call the hospital operator.  01/08/2022, 12:02 PM

## 2022-01-08 NOTE — H&P (View-Only) (Signed)
[facility] Gastroenterology Progress Note  Malcolm Hetz 61 y.o. 1960-12-15   Subjective: Lying in bed comfortably in no distress. Reports diffuse abdominal pain is 9 out of 10 in intensity with 10 being the worse pain in her life. Denies N/V. Reports loose stool today. Tolerating soft food.   Objective: Vital signs: Vitals:   01/08/22 1035 01/08/22 1335  BP: (!) 116/55 (!) 112/59  Pulse: 71 69  Resp: 16 14  Temp:  97.6 F (36.4 C)  SpO2: 100% 99%    Physical Exam: Gen: lethargic, elderly, obese, no acute distress  HEENT: anicteric sclera CV: RRR Chest: CTA B Abd: diffuse tenderness with guarding, soft, nondistended, +BS Ext: no edema  Lab Results: Recent Labs    01/05/22 1830 01/06/22 0435 01/07/22 0514 01/08/22 0523  NA 139   < > 141 141  K 3.9   < > 3.4* 4.0  CL 105   < > 113* 113*  CO2 22   < > 22 22  GLUCOSE 184*   < > 173* 176*  BUN 11   < > 6 6  CREATININE 0.90   < > 0.85 1.40*  CALCIUM 9.2   < > 8.9 9.1  MG 1.4*   < > 1.9 1.7  PHOS 2.9  --   --   --    < > = values in this interval not displayed.   Recent Labs    01/07/22 0514 01/08/22 0523  AST 76* 60*  ALT 76* 64*  ALKPHOS 109 110  BILITOT 0.7 0.7  PROT 7.8 7.3  ALBUMIN 3.2* 3.1*   Recent Labs    01/05/22 1830 01/06/22 0435 01/07/22 0514 01/08/22 0523  WBC 8.7 6.6 5.2 5.7  NEUTROABS 5.7 3.5  --   --   HGB 12.4 11.4* 10.9* 10.9*  HCT 38.5 35.4* 33.7* 34.2*  MCV 88.1 87.8 89.6 90.2  PLT 371 337 345 306      Assessment/Plan: Generalized abdominal pain and N/V prior to admit. Her reported pain is not consistent with her demeanor and question if pain seeking but defer to Dr. Grandville Silos. Would stop IV pain meds and minimize oral narcotic pain meds especially with constipation. NPO p MN. EGD tomorrow to evaluate for peptic ulcer disease and will do gastric biopsies for H. Pylori. Supportive care.   [name] 01/08/2022, 6:02 PM  Questions please call [phone] Patient ID:  Majesti Gambrell, female   DOB: August 14, 1960, 61 y.o.   MRN: [id number]

## 2022-01-08 NOTE — Progress Notes (Signed)
  Transition of Care [facility]) Screening Note   Patient Details  Name: Tracey Castillo Date of Birth: April 05, 1960   Transition of Care [facility]) CM/SW Contact:    Kimber Relic, LCSW Phone Number: 01/08/2022, 11:12 AM    Transition of Care Department [facility]) has reviewed patient and no TOC needs have been identified at this time. We will continue to monitor patient advancement through interdisciplinary progression rounds. If new patient transition needs arise, please place a TOC consult.

## 2022-01-09 ENCOUNTER — Inpatient Hospital Stay (HOSPITAL_COMMUNITY): Payer: Medicaid Other | Admitting: Anesthesiology

## 2022-01-09 ENCOUNTER — Encounter (HOSPITAL_COMMUNITY)
Admission: EM | Disposition: A | Discharge status: Home / Self Care | Payer: Self-pay | Source: Home / Self Care | Attending: Internal Medicine

## 2022-01-09 ENCOUNTER — Encounter (HOSPITAL_COMMUNITY): Payer: Self-pay | Admitting: Internal Medicine

## 2022-01-09 DIAGNOSIS — N179 Acute kidney failure, unspecified: Secondary | ICD-10-CM | POA: Diagnosis not present

## 2022-01-09 DIAGNOSIS — I10 Essential (primary) hypertension: Secondary | ICD-10-CM

## 2022-01-09 DIAGNOSIS — E785 Hyperlipidemia, unspecified: Secondary | ICD-10-CM | POA: Diagnosis not present

## 2022-01-09 DIAGNOSIS — E860 Dehydration: Secondary | ICD-10-CM | POA: Diagnosis not present

## 2022-01-09 DIAGNOSIS — D649 Anemia, unspecified: Secondary | ICD-10-CM

## 2022-01-09 DIAGNOSIS — F418 Other specified anxiety disorders: Secondary | ICD-10-CM

## 2022-01-09 DIAGNOSIS — R112 Nausea with vomiting, unspecified: Secondary | ICD-10-CM | POA: Diagnosis not present

## 2022-01-09 DIAGNOSIS — K2900 Acute gastritis without bleeding: Secondary | ICD-10-CM

## 2022-01-09 HISTORY — PX: ESOPHAGOGASTRODUODENOSCOPY (EGD) WITH PROPOFOL: SHX5813

## 2022-01-09 HISTORY — PX: BIOPSY: SHX5522

## 2022-01-09 LAB — CBC WITH DIFFERENTIAL/PLATELET
Abs Immature Granulocytes: 0.01 10*3/uL (ref 0.00–0.07)
Basophils Absolute: 0 10*3/uL (ref 0.0–0.1)
Basophils Relative: 0 %
Eosinophils Absolute: 0.1 10*3/uL (ref 0.0–0.5)
Eosinophils Relative: 3 %
HCT: 34.4 % — ABNORMAL LOW (ref 36.0–46.0)
Hemoglobin: 10.8 g/dL — ABNORMAL LOW (ref 12.0–15.0)
Immature Granulocytes: 0 %
Lymphocytes Relative: 40 %
Lymphs Abs: 2.2 10*3/uL (ref 0.7–4.0)
MCH: 28.6 pg (ref 26.0–34.0)
MCHC: 31.4 g/dL (ref 30.0–36.0)
MCV: 91.2 fL (ref 80.0–100.0)
Monocytes Absolute: 0.8 10*3/uL (ref 0.1–1.0)
Monocytes Relative: 14 %
Neutro Abs: 2.3 10*3/uL (ref 1.7–7.7)
Neutrophils Relative %: 43 %
Platelets: 324 10*3/uL (ref 150–400)
RBC: 3.77 MIL/uL — ABNORMAL LOW (ref 3.87–5.11)
RDW: 16.1 % — ABNORMAL HIGH (ref 11.5–15.5)
WBC: 5.4 10*3/uL (ref 4.0–10.5)
nRBC: 0 % (ref 0.0–0.2)

## 2022-01-09 LAB — COMPREHENSIVE METABOLIC PANEL
ALT: 58 U/L — ABNORMAL HIGH (ref 0–44)
AST: 55 U/L — ABNORMAL HIGH (ref 15–41)
Albumin: 2.8 g/dL — ABNORMAL LOW (ref 3.5–5.0)
Alkaline Phosphatase: 109 U/L (ref 38–126)
Anion gap: 4 — ABNORMAL LOW (ref 5–15)
BUN: 6 mg/dL (ref 6–20)
CO2: 21 mmol/L — ABNORMAL LOW (ref 22–32)
Calcium: 8.9 mg/dL (ref 8.9–10.3)
Chloride: 113 mmol/L — ABNORMAL HIGH (ref 98–111)
Creatinine, Ser: 2.02 mg/dL — ABNORMAL HIGH (ref 0.44–1.00)
GFR, Estimated: 28 mL/min — ABNORMAL LOW (ref >60)
Glucose, Bld: 140 mg/dL — ABNORMAL HIGH (ref 70–99)
Potassium: 4 mmol/L (ref 3.5–5.1)
Sodium: 138 mmol/L (ref 135–145)
Total Bilirubin: 0.6 mg/dL (ref 0.3–1.2)
Total Protein: 6.8 g/dL (ref 6.5–8.1)

## 2022-01-09 LAB — GLUCOSE, CAPILLARY
Glucose-Capillary: 140 mg/dL — ABNORMAL HIGH (ref 70–99)
Glucose-Capillary: 155 mg/dL — ABNORMAL HIGH (ref 70–99)
Glucose-Capillary: 171 mg/dL — ABNORMAL HIGH (ref 70–99)
Glucose-Capillary: 174 mg/dL — ABNORMAL HIGH (ref 70–99)

## 2022-01-09 LAB — CREATININE, URINE, RANDOM: Creatinine, Urine: 106 mg/dL

## 2022-01-09 LAB — MAGNESIUM: Magnesium: 1.9 mg/dL (ref 1.7–2.4)

## 2022-01-09 LAB — SODIUM, URINE, RANDOM: Sodium, Ur: 41 mmol/L

## 2022-01-09 SURGERY — ESOPHAGOGASTRODUODENOSCOPY (EGD) WITH PROPOFOL
Anesthesia: Monitor Anesthesia Care

## 2022-01-09 MED ORDER — SODIUM CHLORIDE 0.9 % IV SOLN: INTRAVENOUS | Status: DC

## 2022-01-09 MED ORDER — PROPOFOL 10 MG/ML IV BOLUS
INTRAVENOUS | Status: DC | PRN
  Administered 2022-01-09: 20 mg via INTRAVENOUS

## 2022-01-09 MED ORDER — PHENYLEPHRINE 80 MCG/ML (10ML) SYRINGE FOR IV PUSH (FOR BLOOD PRESSURE SUPPORT)
PREFILLED_SYRINGE | INTRAVENOUS | Status: DC | PRN
  Administered 2022-01-09 (×3): 160 ug via INTRAVENOUS

## 2022-01-09 MED ORDER — PROPOFOL 500 MG/50ML IV EMUL
INTRAVENOUS | Status: AC
  Filled 2022-01-09: qty 50

## 2022-01-09 MED ORDER — PROPOFOL 500 MG/50ML IV EMUL
INTRAVENOUS | Status: DC | PRN
  Administered 2022-01-09: 150 ug/kg/min via INTRAVENOUS

## 2022-01-09 MED ORDER — LIDOCAINE 2% (20 MG/ML) 5 ML SYRINGE
INTRAMUSCULAR | Status: DC | PRN
  Administered 2022-01-09: 60 mg via INTRAVENOUS

## 2022-01-09 SURGICAL SUPPLY — 15 items

## 2022-01-09 NOTE — Anesthesia Postprocedure Evaluation (Signed)
Anesthesia Post Note  Patient: Tracey Castillo  Procedure(s) Performed: ESOPHAGOGASTRODUODENOSCOPY (EGD) WITH PROPOFOL BIOPSY     Patient location during evaluation: PACU Anesthesia Type: MAC Level of consciousness: awake Pain management: pain level controlled Vital Signs Assessment: post-procedure vital signs reviewed and stable Respiratory status: spontaneous breathing, nonlabored ventilation, respiratory function stable and patient connected to nasal cannula oxygen Cardiovascular status: stable and blood pressure returned to baseline Postop Assessment: no apparent nausea or vomiting Anesthetic complications: no   No notable events documented.  Last Vitals:  Vitals:   01/09/22 1155 01/09/22 1203  BP: 123/65 120/82  Pulse: 69 70  Resp: 17 18  Temp:  36.4 C  SpO2: 94% 98%    Last Pain:  Vitals:   01/09/22 1203  TempSrc: Oral  PainSc:                  Ridhi Hoffert P Sherea Liptak

## 2022-01-09 NOTE — Interval H&P Note (Signed)
History and Physical Interval Note:  01/09/2022 11:00 AM  Tracey Castillo  has presented today for surgery, with the diagnosis of epigastric pain, nausea, vomiting.  The various methods of treatment have been discussed with the patient and family. After consideration of risks, benefits and other options for treatment, the patient has consented to  Procedure(s): ESOPHAGOGASTRODUODENOSCOPY (EGD) WITH PROPOFOL (N/A) as a surgical intervention.  The patient's history has been reviewed, patient examined, no change in status, stable for surgery.  I have reviewed the patient's chart and labs.  Questions were answered to the patient's satisfaction.     [name]

## 2022-01-09 NOTE — Anesthesia Preprocedure Evaluation (Addendum)
Anesthesia Evaluation  Patient identified by MRN, date of birth, ID band Patient awake    Reviewed: Allergy & Precautions, NPO status , Patient's Chart, lab work & pertinent test results  Airway Mallampati: III  TM Distance: >3 FB Neck ROM: Full    Dental  (+) Missing   Pulmonary neg pulmonary ROS   Pulmonary exam normal        Cardiovascular hypertension, Pt. on medications Normal cardiovascular exam     Neuro/Psych  Headaches PSYCHIATRIC DISORDERS Anxiety Depression       GI/Hepatic Neg liver ROS,GERD  Medicated and Controlled,,  Endo/Other  diabetes, Insulin Dependent  Morbid obesity  Renal/GU Renal disease     Musculoskeletal negative musculoskeletal ROS (+)    Abdominal  (+) + obese  Peds  Hematology  (+) Blood dyscrasia, anemia   Anesthesia Other Findings Epigastric pain, nausea, vomiting  Reproductive/Obstetrics                             Anesthesia Physical Anesthesia Plan  ASA: 3  Anesthesia Plan: MAC   Post-op Pain Management:    Induction: Intravenous  PONV Risk Score and Plan: 2 and Propofol infusion and Treatment may vary due to age or medical condition  Airway Management Planned: Simple Face Mask  Additional Equipment:   Intra-op Plan:   Post-operative Plan:   Informed Consent: I have reviewed the patients History and Physical, chart, labs and discussed the procedure including the risks, benefits and alternatives for the proposed anesthesia with the patient or authorized representative who has indicated his/her understanding and acceptance.     Dental advisory given  Plan Discussed with: CRNA  Anesthesia Plan Comments:         Anesthesia Quick Evaluation

## 2022-01-09 NOTE — Progress Notes (Addendum)
PROGRESS NOTE    Tracey Castillo  YQI:[id number] DOB: [birth year] DOA: 01/05/2022 PCP: [facility]    Chief Complaint  Patient presents with   Vomiting    Brief Narrative:  Patient 61 year old female history of insulin-dependent type 2 diabetes, hypertension, hyperlipidemia, GERD presented to [place] freestanding ED with a day history of intractable nausea vomiting, epigastric abdominal pain.  Patient noted to have a transaminitis, noted to be dehydrated, noted to have elevated blood pressure on presentation.  Patient admitted for further evaluation and work-up.   Assessment & Plan:   Principal Problem:   Intractable nausea and vomiting Active Problems:   Epigastric abdominal pain   SIRS (systemic inflammatory response syndrome) (HCC)   Transaminitis   Dyspepsia   Obesity, Class III, BMI 40-49.9 (morbid obesity) (HCC)   Dyslipidemia   GERD (gastroesophageal reflux disease)   MDD (major depressive disorder)   AKI (acute kidney injury) ([place])   Dehydration   Sinus tachycardia   Elevated liver function tests   Nausea and vomiting   Hypertension   Hypomagnesemia   Acute kidney injury (nontraumatic) (HCC)   Elevated lactic acid level  #1 intractable nausea vomiting epigastric abdominal pain -Patient presented with nausea vomiting x8 days, epigastric abdominal pain and on examination right upper quadrant pain. -Concern for gastritis versus peptic ulcer disease versus upper GI etiology. -Likely secondary to dyspepsia.  Patient also with complaints of constipation x2 weeks. -Patient noted on labs to have a transaminitis which is trending back down. -Patient with 20-year history of diabetes mellitus, insulin-dependent. -Patient denies any history of peptic ulcer disease. -CT abdomen and pelvis done with diverticulosis without diverticulitis, no acute abnormality noted, no biliary dilatation noted, status postcholecystectomy, no focal liver abnormality seen,  unremarkable pancreas. -Labs on presentation in the ED noted a lactic acidosis which improved with hydration, transaminitis, urinalysis with some glycosuria and ketonuria, initial comprehensive metabolic profile with a slight acidosis with bicarb of 20, anion gap of 16, glucose of 280. -Patient states last episode of emesis was on 01/04/2022.  -Noted to be improving as patient with no further nausea or vomiting and tolerating current full liquid diet. -Patient noted to have required 12 mg of IV morphine since around 2 AM the morning of 01/08/2022, and noted to have required 12 mg of IV morphine on 01/07/2022 for pain. -Patient started on bowel regimen of morphine twice daily, Senokot-S twice daily and SMOG enema x1 ordered as patient with complaints of constipation which may be contributing to patient's symptoms. -Discontinued IV morphine and monitor on oral pain medications. -Due to patient's ongoing epigastric and right upper quadrant pain and nausea GI consulted for further evaluation and management.  -GI feels could likely be secondary to dyspepsia and as patient is improving clinically initially was felt endoscopy was not needed however due to patient's ongoing epigastric abdominal pain including increased IV pain medications, patient for upper endoscopy today for further evaluation and management.  -Continue IV PPI every 12 hours.   -Sucralfate slurry 4 times daily added per GI.   -Supportive care.   -GI following and appreciate input and recommendations.     2.  Dehydration -Improved with hydration.     3.  Transaminitis -?  Etiology. -Likely secondary to dehydration. -CT abdomen and pelvis with no acute abnormalities noted. -Acute hepatitis panel negative.   -LFTs trending down with fluid resuscitation.   -Continue to hold statin. -Supportive care. -Repeat labs in the AM.   4.  SIRS, ruled out -Patient  met criteria of SIRS on presentation with tachycardia, elevated lactic acid  level. -Blood cultures pending with no growth to date x 3 days. -Chest x-ray with no acute abnormalities. -CT abdomen and pelvis with diverticulosis without diverticulitis, no acute abnormalities noted. -Urinalysis nitrite negative, leukocytes negative. -Supportive care.   -No need for antibiotics at this time.    5.  GERD -IV PPI.    6.  Sinus tachycardia -Likely secondary to dehydration, nausea and vomiting. -Patient also noted to have been on a beta-blocker prior to admission and had run out of medications and needed refills. -Sinus tachycardia improved. -EKG with sinus tachycardia with no ischemic changes noted.  -TSH within normal limits at 0.527.  D-dimer slightly elevated however low probability for PE.   -Troponin noted at 22 >> 20, and flattened.  Patient denies any overt chest pain.   -2D echo with EF of 60 to 65%, NWMA.   -IV Lopressor has been transitioned to home regimen Coreg.   -Supportive care.     7.  Diabetes mellitus type 2, insulin-dependent -Hemoglobin A1c at 9.4.  -CBG 140 this morning.  -Continue Semglee 15 units daily.  SSI.   -Diabetes coordinator following.     8.  Hyperlipidemia -Continue to hold statin.    9.  Acute kidney injury -Likely secondary to prerenal azotemia in the setting of ongoing nausea and vomiting with poor oral intake in the setting of ARB. -Renal function initially improved with hydration.   -ARB held initially subsequently resumed but bump in creatinine up to 2.02 this morning and as such we will discontinue ARB.  -CT abdomen and pelvis with no obstructive changes noted, no renal calculi, bladder decompressed. -Check urine sodium, urine creatinine. -Gentle hydration for the next 24 hours. -Repeat labs in the AM.   10.  Morbid obesity -Lifestyle modification -Outpatient follow-up with PCP.   11.  History of depressive disorder -Celexa.   12.  Hypertension -Due to acute kidney injury ARB was held on admission.  -Patient  initially was on IV Lopressor and transitioned to Coreg.  -ARB resumed however due to bump in creatinine this morning we will discontinue ARB.   -Place back on Norvasc 5 mg daily.   -IV hydralazine as needed.   13.  Constipation -Patient with complaints of constipation, states no bowel movement in 2 weeks prior to admission. -Patient placed on MiraLAX twice daily, Senokot-S twice daily and received a smog enema 01/08/2022 and states had a bowel movement. -Continue current bowel regimen.      DVT prophylaxis: Lovenox Code Status: Full Family Communication: Updated patient.  Updated sister at bedside. Disposition: Home once clinically improved, tolerating oral intake and cleared by GI.  Status is: Inpatient    Consultants:  Gastroenterology: Dr. Michail Sermon 01/06/2022  Procedures:  CT abdomen and pelvis 01/05/2022 Chest x-ray 01/05/2022  Antimicrobials:  None   Subjective: Patient sitting up in chair.  Still with complaints of epigastric abdominal pain.  Denies any nausea or vomiting.  Asking when she can eat.  Sister at bedside.  Awaiting EGD today for further evaluation.  States had decent sized bowel movement yesterday.  .    Objective: Vitals:   01/08/22 2007 01/09/22 0500 01/09/22 0525 01/09/22 0712  BP: (!) 142/60  (!) 147/65   Pulse: 68  73   Resp: 17  15   Temp: 98.1 F (36.7 C)  97.7 F (36.5 C)   TempSrc: Oral  Oral   SpO2: 100%  99%   Weight:  119 kg  119.1 kg  Height:        Intake/Output Summary (Last 24 hours) at 01/09/2022 0931 Last data filed at 01/08/2022 2000 Gross per 24 hour  Intake 240 ml  Output --  Net 240 ml    Filed Weights   01/08/22 0500 01/09/22 0500 01/09/22 0712  Weight: 118 kg 119 kg 119.1 kg    Examination:  General exam: NAD Respiratory system: CTA B.  No wheezes, no crackles, no rhonchi.  Fair air movement.  Speaking in full sentences.  Cardiovascular system: RRR no murmurs rubs or gallops.  No JVD.  No lower extremity  edema.  Gastrointestinal system: Abdomen is soft, nondistended, tender to palpation in the epigastric region and right upper quadrant.  Positive bowel sounds.  No rebound.  No guarding.  Central nervous system: Alert and oriented. No focal neurological deficits. Extremities: Symmetric 5 x 5 power. Skin: No rashes, lesions or ulcers Psychiatry: Judgement and insight appear normal. Mood & affect appropriate.     Data Reviewed: I have personally reviewed following labs and imaging studies  CBC: Recent Labs  Lab 01/05/22 0109 01/05/22 0246 01/05/22 1830 01/06/22 0435 01/07/22 0514 01/08/22 0523 01/09/22 0513  WBC 8.9  --  8.7 6.6 5.2 5.7 5.4  NEUTROABS 6.5  --  5.7 3.5  --   --  2.3  HGB 13.9   < > 12.4 11.4* 10.9* 10.9* 10.8*  HCT 41.6   < > 38.5 35.4* 33.7* 34.2* 34.4*  MCV 86.0  --  88.1 87.8 89.6 90.2 91.2  PLT 428*  --  371 337 345 306 324   < > = values in this interval not displayed.     Basic Metabolic Panel: Recent Labs  Lab 01/05/22 1830 01/06/22 0435 01/07/22 0514 01/08/22 0523 01/09/22 0513  NA 139 141 141 141 138  K 3.9 3.6 3.4* 4.0 4.0  CL 105 111 113* 113* 113*  CO2 _0 21*  GLUCOSE 184* 138* 173* 176* 140*  BUN _1 CREATININE 0.90 0.82 0.85 1.40* 2.02*  CALCIUM 9.2 8.7* 8.9 9.1 8.9  MG 1.4* 2.3 1.9 1.7 1.9  PHOS 2.9  --   --   --   --      GFR: Estimated Creatinine Clearance: 38.9 mL/min (A) (by C-G formula based on SCr of 2.02 mg/dL (H)).  Liver Function Tests: Recent Labs  Lab 01/05/22 1830 01/06/22 0435 01/07/22 0514 01/08/22 0523 01/09/22 0513  AST 102* 94* 76* 60* 55*  ALT 97* 87* 76* 64* 58*  ALKPHOS 125 110 109 110 109  BILITOT 1.1 0.9 0.7 0.7 0.6  PROT 8.4* 7.5 7.8 7.3 6.8  ALBUMIN 3.5 3.1* 3.2* 3.1* 2.8*     CBG: Recent Labs  Lab 01/08/22 0741 01/08/22 1209 01/08/22 1720 01/08/22 2012 01/09/22 0743  GLUCAP 161* 200* 212* 140* 140*      Recent Results (from the past 240 hour(s))  Culture, blood  (Routine x 2)     Status: None (Preliminary result)   Collection Time: 01/05/22  1:00 AM   Specimen: BLOOD  Result Value Ref Range Status   Specimen Description   Final    BLOOD LEFT ANTECUBITAL Performed at [facility], [address] [place], [place] [address]    Special Requests   Final    BOTTLES DRAWN AEROBIC AND ANAEROBIC Blood Culture adequate volume Performed at [facility], [address] [place], [place]  [address]    Culture   Final    NO GROWTH 3 DAYS Performed at [facility], [place] [address], [place], [place] [address]    Report Status PENDING  Incomplete  Culture, blood (single)     Status: None (Preliminary result)   Collection Time: 01/05/22  9:34 AM   Specimen: BLOOD RIGHT HAND  Result Value Ref Range Status   Specimen Description   Final    BLOOD RIGHT HAND Performed at [facility], [address] [place], [place] [address]    Special Requests   Final    BOTTLES DRAWN AEROBIC AND ANAEROBIC Blood Culture adequate volume Performed at [facility], [address] [place], [place] [address]    Culture   Final    NO GROWTH 3 DAYS Performed at [facility], [place] [address], [place], [place] [address]    Report Status PENDING  Incomplete  SARS Coronavirus 2 by RT PCR (hospital order, performed in [facility] hospital lab) *cepheid single result test* Anterior Nasal Swab     Status: None   Collection Time: 01/05/22  6:36 PM   Specimen: Anterior Nasal Swab  Result Value Ref Range Status   SARS Coronavirus 2 by RT PCR NEGATIVE NEGATIVE Final    Comment: (NOTE) SARS-CoV-2 target nucleic acids are NOT DETECTED.  The SARS-CoV-2 RNA is generally detectable in upper and lower respiratory specimens during the acute phase of infection. The lowest concentration of SARS-CoV-2 viral copies this assay can detect is 250 copies / mL. A negative result does not preclude SARS-CoV-2 infection and should not be used as  the sole basis for treatment or other patient management decisions.  A negative result may occur with improper specimen collection / handling, submission of specimen other than nasopharyngeal swab, presence of viral mutation(s) within the areas targeted by this assay, and inadequate number of viral copies (<250 copies / mL). A negative result must be combined with clinical observations, patient history, and epidemiological information.  Fact Sheet for Patients:   https://www.patel.info/  Fact Sheet for Healthcare Providers: https://hall.com/  This test is not yet approved or  cleared by the [place] FDA and has been authorized for detection and/or diagnosis of SARS-CoV-2 by FDA under an Emergency Use Authorization (EUA).  This EUA will remain in effect (meaning this test can be used) for the duration of the COVID-19 declaration under Section 564(b)(1) of the Act, 21 U.S.C. section 360bbb-3(b)(1), unless the authorization is terminated or revoked sooner.  Performed at [facility], [place] [address] [place], [place] [address]          Radiology Studies: No results found.      Scheduled Meds:  allopurinol  300 mg Oral Daily   bisacodyl  10 mg Rectal Once   carvedilol  25 mg Oral BID   citalopram  40 mg Oral Daily   enoxaparin (LOVENOX) injection  60 mg Subcutaneous Q24H   fluticasone  1 spray Each Nare Daily   hydrOXYzine  25 mg Oral QHS   influenza vac split quadrivalent PF  0.5 mL Intramuscular Tomorrow-1000   insulin aspart  0-15 Units Subcutaneous TID WC   insulin glargine-yfgn  15 Units Subcutaneous QHS   pantoprazole (PROTONIX) IV  40 mg Intravenous Q12H   pneumococcal 20-valent conjugate vaccine  0.5 mL Intramuscular Tomorrow-1000   polyethylene glycol  17 g Oral BID   senna-docusate  1 tablet Oral BID   sodium  chloride flush  3 mL Intravenous Q12H   sucralfate  1 g Oral TID WC & HS    Continuous Infusions:  sodium chloride 100 mL/hr at 01/09/22 0907      LOS: 1 day    Time spent: 40 minutes    Irine Seal, MD [facility]   To contact the attending provider between 7A-7P or the covering provider during after hours 7P-7A, please log into the web site [email or web address] and access using universal [place] password for that web site. If you do not have the password, please call the hospital operator.  01/09/2022, 9:31 AM

## 2022-01-09 NOTE — Op Note (Signed)
[facility] Patient Name: Tracey Castillo Procedure Date: 01/09/2022 MRN: [id number] Attending MD: [name] , MD, [id number] Date of Birth: [birth year] CSN: [id number] Age: 61 Admit Type: Inpatient Procedure:                Upper GI endoscopy Indications:              Epigastric abdominal pain, Nausea with vomiting Providers:                [name], MD, Carlyn Reichert, RN,                            William Dalton, Technician Referring MD:             hospital team Medicines:                Propofol per Anesthesia, Monitored Anesthesia Care Complications:            No immediate complications. Estimated Blood Loss:     Estimated blood loss was minimal. Procedure:                Pre-Anesthesia Assessment:                           - Prior to the procedure, a History and Physical                            was performed, and patient medications and                            allergies were reviewed. The patient's tolerance of                            previous anesthesia was also reviewed. The risks                            and benefits of the procedure and the sedation                            options and risks were discussed with the patient.                            All questions were answered, and informed consent                            was obtained. Prior Anticoagulants: The patient has                            taken no anticoagulant or antiplatelet agents. ASA                            Grade Assessment: III - A patient with severe                            systemic disease. After reviewing the risks and  benefits, the patient was deemed in satisfactory                            condition to undergo the procedure.                           After obtaining informed consent, the endoscope was                            passed under direct vision. Throughout the                            procedure, the  patient's blood pressure, pulse, and                            oxygen saturations were monitored continuously. The                            [id number]-H190 ([id number]) Olympus endoscope was introduced                            through the mouth, and advanced to the second part                            of duodenum. The upper GI endoscopy was                            accomplished without difficulty. The patient                            tolerated the procedure well. Scope In: Scope Out: Findings:      The examined esophagus was normal.      The Z-line was regular and was found 36 cm from the incisors.      Segmental mild inflammation characterized by congestion (edema) and       erythema was found in the gastric antrum. Biopsies were taken with a       cold forceps for histology. Estimated blood loss was minimal.      There is no endoscopic evidence of ulceration in the stomach.      The cardia and gastric fundus were normal on retroflexion.      The examined duodenum was normal.      Segmental mild inflammation characterized by congestion (edema) and       erythema was found in the gastric fundus. Impression:               - Normal esophagus.                           - Z-line regular, 36 cm from the incisors.                           - Acute gastritis. Biopsied.                           - Normal examined duodenum.                           -  Gastritis. Moderate Sedation:      N/A - MAC procedure Recommendation:           - Soft diet.                           - Await pathology results. Procedure Code(s):        --- Professional ---                           [phone], Esophagogastroduodenoscopy, flexible,                            transoral; with biopsy, single or multiple Diagnosis Code(s):        --- Professional ---                           R10.13, Epigastric pain                           K29.70, Gastritis, unspecified, without bleeding                           K29.00, Acute  gastritis without bleeding                           R11.2, Nausea with vomiting, unspecified CPT copyright 2022 American Medical Association. All rights reserved. The codes documented in this report are preliminary and upon coder review may  be revised to meet current compliance requirements. [name], MD 01/09/2022 11:27:43 AM This report has been signed electronically. Number of Addenda: 0

## 2022-01-09 NOTE — Transfer of Care (Signed)
Immediate Anesthesia Transfer of Care Note  Patient: Tracey Castillo  Procedure(s) Performed: ESOPHAGOGASTRODUODENOSCOPY (EGD) WITH PROPOFOL BIOPSY  Patient Location: PACU  Anesthesia Type:MAC  Level of Consciousness: awake, alert  and oriented  Airway & Oxygen Therapy: Patient Spontanous Breathing and Patient connected to face mask oxygen  Post-op Assessment: Report given to RN and Post -op Vital signs reviewed and stable  Post vital signs: Reviewed and stable  Last Vitals:  Vitals Value Taken Time  BP    Temp    Pulse    Resp    SpO2      Last Pain:  Vitals:   01/09/22 1040  TempSrc: Temporal  PainSc: 4       Patients Stated Pain Goal: 4 (25/85/27 7824)  Complications: No notable events documented.

## 2022-01-09 NOTE — Brief Op Note (Signed)
Mild gastritis otherwise normal EGD. No ulcers seen. Biopsies pending for H. Pylori. No source of abdominal pain seen on EGD. Soft diet. Will sign off. Call if questions.

## 2022-01-10 ENCOUNTER — Other Ambulatory Visit (HOSPITAL_COMMUNITY): Payer: Self-pay

## 2022-01-10 ENCOUNTER — Inpatient Hospital Stay (HOSPITAL_COMMUNITY): Payer: Medicaid Other

## 2022-01-10 DIAGNOSIS — E860 Dehydration: Secondary | ICD-10-CM | POA: Diagnosis not present

## 2022-01-10 DIAGNOSIS — Z794 Long term (current) use of insulin: Secondary | ICD-10-CM

## 2022-01-10 DIAGNOSIS — K2900 Acute gastritis without bleeding: Secondary | ICD-10-CM | POA: Diagnosis not present

## 2022-01-10 DIAGNOSIS — E119 Type 2 diabetes mellitus without complications: Secondary | ICD-10-CM

## 2022-01-10 DIAGNOSIS — N179 Acute kidney failure, unspecified: Secondary | ICD-10-CM | POA: Diagnosis not present

## 2022-01-10 DIAGNOSIS — R112 Nausea with vomiting, unspecified: Secondary | ICD-10-CM | POA: Diagnosis not present

## 2022-01-10 LAB — CULTURE, BLOOD (ROUTINE X 2)
Culture: NO GROWTH
Special Requests: ADEQUATE

## 2022-01-10 LAB — CBC
HCT: 32.3 % — ABNORMAL LOW (ref 36.0–46.0)
Hemoglobin: 10.1 g/dL — ABNORMAL LOW (ref 12.0–15.0)
MCH: 28.5 pg (ref 26.0–34.0)
MCHC: 31.3 g/dL (ref 30.0–36.0)
MCV: 91.2 fL (ref 80.0–100.0)
Platelets: 328 10*3/uL (ref 150–400)
RBC: 3.54 MIL/uL — ABNORMAL LOW (ref 3.87–5.11)
RDW: 15.9 % — ABNORMAL HIGH (ref 11.5–15.5)
WBC: 5.7 10*3/uL (ref 4.0–10.5)
nRBC: 0 % (ref 0.0–0.2)

## 2022-01-10 LAB — COMPREHENSIVE METABOLIC PANEL
ALT: 52 U/L — ABNORMAL HIGH (ref 0–44)
AST: 51 U/L — ABNORMAL HIGH (ref 15–41)
Albumin: 2.6 g/dL — ABNORMAL LOW (ref 3.5–5.0)
Alkaline Phosphatase: 112 U/L (ref 38–126)
Anion gap: 3 — ABNORMAL LOW (ref 5–15)
BUN: 9 mg/dL (ref 6–20)
CO2: 22 mmol/L (ref 22–32)
Calcium: 8.7 mg/dL — ABNORMAL LOW (ref 8.9–10.3)
Chloride: 114 mmol/L — ABNORMAL HIGH (ref 98–111)
Creatinine, Ser: 2.08 mg/dL — ABNORMAL HIGH (ref 0.44–1.00)
GFR, Estimated: 27 mL/min — ABNORMAL LOW (ref >60)
Glucose, Bld: 165 mg/dL — ABNORMAL HIGH (ref 70–99)
Potassium: 3.9 mmol/L (ref 3.5–5.1)
Sodium: 139 mmol/L (ref 135–145)
Total Bilirubin: 0.3 mg/dL (ref 0.3–1.2)
Total Protein: 6.4 g/dL — ABNORMAL LOW (ref 6.5–8.1)

## 2022-01-10 LAB — CULTURE, BLOOD (SINGLE)
Culture: NO GROWTH
Special Requests: ADEQUATE

## 2022-01-10 LAB — MAGNESIUM: Magnesium: 1.7 mg/dL (ref 1.7–2.4)

## 2022-01-10 LAB — GLUCOSE, CAPILLARY
Glucose-Capillary: 150 mg/dL — ABNORMAL HIGH (ref 70–99)
Glucose-Capillary: 202 mg/dL — ABNORMAL HIGH (ref 70–99)

## 2022-01-10 MED ORDER — MAGNESIUM SULFATE 2 GM/50ML IV SOLN
2.0000 g | Freq: Once | INTRAVENOUS | Status: AC
  Administered 2022-01-10: 2 g via INTRAVENOUS
  Filled 2022-01-10: qty 50

## 2022-01-10 MED ORDER — ALLOPURINOL 300 MG PO TABS
300.0000 mg | ORAL_TABLET | Freq: Every day | ORAL | 0 refills | Status: AC
  Filled 2022-01-10: qty 30, 30d supply, fill #0

## 2022-01-10 MED ORDER — AMLODIPINE BESYLATE 5 MG PO TABS
5.0000 mg | ORAL_TABLET | Freq: Every day | ORAL | 1 refills | Status: AC
  Filled 2022-01-10: qty 30, 30d supply, fill #0

## 2022-01-10 MED ORDER — PANTOPRAZOLE SODIUM 40 MG PO TBEC
DELAYED_RELEASE_TABLET | ORAL | 1 refills | Status: AC
  Filled 2022-01-10: qty 90, 60d supply, fill #0

## 2022-01-10 MED ORDER — CARVEDILOL 25 MG PO TABS
25.0000 mg | ORAL_TABLET | Freq: Two times a day (BID) | ORAL | 1 refills | Status: AC
  Filled 2022-01-10: qty 60, 30d supply, fill #0

## 2022-01-10 MED ORDER — SENNOSIDES-DOCUSATE SODIUM 8.6-50 MG PO TABS: 1.0000 | ORAL_TABLET | Freq: Two times a day (BID) | ORAL | Status: AC

## 2022-01-10 MED ORDER — SUCRALFATE 1 GM/10ML PO SUSP
1.0000 g | Freq: Three times a day (TID) | ORAL | 0 refills | Status: AC
  Filled 2022-01-10: qty 420, 11d supply, fill #0

## 2022-01-10 MED ORDER — FOLIC ACID 1 MG PO TABS: 1.0000 mg | ORAL_TABLET | Freq: Every day | ORAL | Status: AC

## 2022-01-10 MED ORDER — CITALOPRAM HYDROBROMIDE 40 MG PO TABS
40.0000 mg | ORAL_TABLET | Freq: Every day | ORAL | 1 refills | Status: AC
  Filled 2022-01-10 (×2): qty 30, 30d supply, fill #0

## 2022-01-10 MED ORDER — POLYETHYLENE GLYCOL 3350 17 G PO PACK
17.0000 g | PACK | Freq: Two times a day (BID) | ORAL | 0 refills | Status: AC
  Filled 2022-01-10: qty 72, 36d supply, fill #0

## 2022-01-10 MED ORDER — AMLODIPINE BESYLATE 5 MG PO TABS
5.0000 mg | ORAL_TABLET | Freq: Every day | ORAL | Status: DC
  Administered 2022-01-10: 5 mg via ORAL
  Filled 2022-01-10: qty 1

## 2022-01-10 NOTE — Plan of Care (Signed)
  Problem: Education: Goal: Knowledge of General Education information will improve Description: Including pain rating scale, medication(s)/side effects and non-pharmacologic comfort measures Outcome: Progressing   Problem: Health Behavior/Discharge Planning: Goal: Ability to manage health-related needs will improve Outcome: Progressing   Problem: Clinical Measurements: Goal: Ability to maintain clinical measurements within normal limits will improve Outcome: Progressing   Problem: Activity: Goal: Risk for activity intolerance will decrease Outcome: Progressing   Problem: Nutrition: Goal: Adequate nutrition will be maintained Outcome: Progressing   Problem: Coping: Goal: Level of anxiety will decrease Outcome: Progressing   Problem: Elimination: Goal: Will not experience complications related to bowel motility Outcome: Progressing   Problem: Pain Managment: Goal: General experience of comfort will improve Outcome: Progressing   Problem: Safety: Goal: Ability to remain free from injury will improve Outcome: Progressing   Problem: Skin Integrity: Goal: Risk for impaired skin integrity will decrease Outcome: Progressing   Problem: Fluid Volume: Goal: Ability to maintain a balanced intake and output will improve Outcome: Progressing   Problem: Nutritional: Goal: Maintenance of adequate nutrition will improve Outcome: Progressing

## 2022-01-10 NOTE — Progress Notes (Signed)
Mobility Specialist - Progress Note   01/10/22 0952  Mobility  Activity Ambulated with assistance in hallway  Level of Assistance Contact guard assist, steadying assist  Assistive Device Other (Comment) (Hallway Rails)  Distance Ambulated (ft) 240 ft  Range of Motion/Exercises Active  Activity Response Tolerated well  Mobility Referral Yes  $Mobility charge 1 Mobility   Pt was found in bed and agreeable to ambulate. Stated feeling a little nauseous before ambulating and during ambulation became fatigued. At EOS returned to bed with all necessities in reach.  Ferd Hibbs Mobility Specialist

## 2022-01-10 NOTE — Progress Notes (Signed)
Mobility Specialist - Progress Note   01/10/22 1520  Mobility  Activity Ambulated with assistance in hallway  Level of Assistance Standby assist, set-up cues, supervision of patient - no hands on  Assistive Device Other (Comment) (Hallway Rails)  Distance Ambulated (ft) 220 ft  Range of Motion/Exercises Active  Activity Response Tolerated well  Mobility Referral Yes  $Mobility charge 1 Mobility   Pt was found in bed and agreeable to ambulate. Had no complaints and at EOS returned to recliner chair with all necessities in reach.  Ferd Hibbs Mobility Specialist

## 2022-01-10 NOTE — Discharge Summary (Signed)
Physician Discharge Summary  Tracey Castillo OTR:[id number] DOB: Dec 01, 1960 DOA: 01/05/2022  PCP: [facility]  Admit date: 01/05/2022 Discharge date: 01/10/2022  Time spent: 55 minutes  Recommendations for Outpatient Follow-up:  Follow-up with [facility] in 2 weeks.  On follow-up patient need a comprehensive metabolic profile done to follow-up on electrolytes and renal function.  Patient need a CBC done to follow-up on H&H.  Patient's diabetes will need to be reassessed and managed on follow-up as well as blood pressure.  Patient's ARB discontinued due to AKI during the hospitalization and discharged home on Norvasc as well as home regimen Coreg.   Discharge Diagnoses:  Principal Problem:   Intractable nausea and vomiting Active Problems:   Epigastric abdominal pain   SIRS (systemic inflammatory response syndrome) (HCC)   Transaminitis   Dyspepsia   Acute gastritis   Obesity, Class III, BMI 40-49.9 (morbid obesity) (HCC)   Dyslipidemia   GERD (gastroesophageal reflux disease)   MDD (major depressive disorder)   AKI (acute kidney injury) ([place])   Dehydration   Sinus tachycardia   Elevated liver function tests   Nausea and vomiting   Hypertension   Hypomagnesemia   Acute kidney injury (nontraumatic) (HCC)   Elevated lactic acid level   Type 2 diabetes mellitus without complication, with long-term current use of insulin ([place])   Discharge Condition: Stable and improved  Diet recommendation: Carb modified diet  Filed Weights   01/09/22 0712 01/09/22 1040 01/10/22 0526  Weight: 119.1 kg 119.1 kg 120.3 kg    History of present illness:   Tracey Castillo is a 61 y.o. female with medical history significant of insulin-dependent type 2 diabetes, hypertension, hyperlipidemia, GERD who presented to [facility] ED with a 8-day history of intractable nausea and emesis describing the emesis as brownish-reddish in nature, subjective fevers, nausea,  abdominal pain, intermittent chills, lightheadedness and dizziness.  Patient denies any chest pain, no shortness of breath, no diarrhea, no constipation, no melena, no syncopal episodes.  Patient denies any recent sick contacts.  Patient denies any history of peptic ulcer disease.   ED Course: Patient seen in the ED noted initially to be tachycardic on presentation with heart rates in the 140s, noted to have a significantly elevated blood pressure of 204/69 which had improved, CBC done within normal limits.  Comprehensive metabolic profile done with a sodium of 134, bicarb of 19, glucose of 280, creatinine of 1.4, alk phosphatase of 155, AST of 147, ALT of 119, total protein of 9.6, bilirubin of 1.4.  Lactic acid initially elevated at 4.6 with trended down to 3.4 and subsequently 2.1.  Urine pregnancy negative.  Urinalysis cloudy, small bilirubin, 100 of glucose, ketones> 80, nitrite negative, leukocytes negative.  Blood cultures x2 ordered in the ED. patient hydrated with IV fluids with some improvement with tachycardic with heart rates now in the 120s.  Hospital Course:  #1 intractable nausea vomiting epigastric abdominal pain -Patient presented with nausea vomiting x8 days, epigastric abdominal pain and on examination right upper quadrant pain. -Concern for gastritis versus peptic ulcer disease versus upper GI etiology. -Likely secondary to dyspepsia.  Patient also with complaints of constipation x2 weeks. -Patient noted on labs to have a transaminitis which is trending back down. -Patient with 20-year history of diabetes mellitus, insulin-dependent. -Patient denies any history of peptic ulcer disease. -CT abdomen and pelvis done with diverticulosis without diverticulitis, no acute abnormality noted, no biliary dilatation noted, status postcholecystectomy, no focal liver abnormality seen, unremarkable pancreas. -  Labs on presentation in the ED noted a lactic acidosis which improved with hydration,  transaminitis, urinalysis with some glycosuria and ketonuria, initial comprehensive metabolic profile with a slight acidosis with bicarb of 20, anion gap of 16, glucose of 280. -Patient stated last episode of emesis was on 01/04/2022.  -Noted to be improving as patient with no further nausea or vomiting and tolerating diet during the hospitalization.  -Patient noted to have required 12 mg of IV morphine since around 2 AM the morning of 01/08/2022, and noted to have required 12 mg of IV morphine on 01/07/2022 for pain. -Patient started on bowel regimen of morphine twice daily, Senokot-S twice daily and SMOG enema x1 ordered as patient with complaints of constipation which may be contributing to patient's symptoms. -Discontinued IV morphine and monitored on oral pain medications. -Due to patient's ongoing epigastric and right upper quadrant pain and nausea GI consulted for further evaluation and management.  -GI feels could likely be secondary to dyspepsia and as patient is improving clinically initially was felt endoscopy was not needed however due to patient's ongoing epigastric abdominal pain including increased IV pain medications, patient subsequently underwent upper endoscopy 01/09/2022 which was consistent with an acute gastritis.   -Patient maintained on IV PPI twice daily during the hospitalization will be transition to oral PPI twice daily x1 month, then daily thereafter.   -Patient also started on sucralfate slurry 4 times daily per GI which patient will be discharged home on for 10-11 more days.   -Patient improved clinically and will be discharged in stable and improved condition with outpatient follow-up with PCP.     2.  Dehydration -Improved with hydration.     3.  Transaminitis -?  Etiology. -Likely secondary to dehydration. -CT abdomen and pelvis with no acute abnormalities noted. -Acute hepatitis panel negative.   -LFTs trended down with fluid resuscitation.   -Statin held during  the hospitalization will be resumed on discharge. -Outpatient follow-up with PCP.   4.  SIRS, ruled out -Patient met criteria of SIRS on presentation with tachycardia, elevated lactic acid level. -Blood cultures done were negative x5 days.  -Chest x-ray with no acute abnormalities. -CT abdomen and pelvis with diverticulosis without diverticulitis, no acute abnormalities noted. -Urinalysis nitrite negative, leukocytes negative. -No further work-up needed.   5.  GERD -Patient maintained on IV PPI twice daily and started on sucralfate.   -Upper endoscopy done due to problem #1 showed an acute gastritis.  Patient be discharged home on PPI twice daily x1 month, and then subsequently daily thereafter in addition to sucralfate x10 to 11 days.   -Outpatient follow-up with PCP.    6.  Sinus tachycardia -Likely secondary to dehydration, nausea and vomiting. -Patient also noted to have been on a beta-blocker prior to admission and had run out of medications and needed refills. -Sinus tachycardia improved with resumption of beta-blocker and hydration. -EKG with sinus tachycardia with no ischemic changes noted.  -TSH within normal limits at 0.527.  D-dimer slightly elevated however low probability for PE.   -Troponin noted at 22 >> 20, and flattened.  Patient denied any overt chest pain.   -2D echo with EF of 60 to 65%, NWMA.   -Patient initially was on IV Lopressor and subsequently transition back to home regimen Coreg.   -Sinus tachycardia had resolved by day of discharge.     7.  Diabetes mellitus type 2, insulin-dependent -Hemoglobin A1c at 9.4.  -Patient maintained on Semglee, SSI during the hospitalization.  -  Patient will be resumed back on home regimen.   -Outpatient follow-up.     8.  Hyperlipidemia -Patient statin was held during the hospitalization will be resumed on discharge.    9.  Acute kidney injury -Likely secondary to prerenal azotemia in the setting of ongoing nausea and  vomiting with poor oral intake in the setting of ARB. -Renal function initially improved with hydration.   -ARB held initially subsequently resumed but bump in creatinine up to 2.08 on day of discharge.  -ARB subsequently discontinued.   -CT abdomen and pelvis with no obstructive changes noted, no renal calculi, bladder decompressed. -Patient gently hydrated with IV fluids.   -Case discussed with nephrology who recommended discontinuation of ARB, no further work-up needed inpatient with outpatient follow-up with PCP.     10.  Morbid obesity -Lifestyle modification -Outpatient follow-up with PCP.   11.  History of depressive disorder -Patient placed back on home regimen Celexa.   12.  Hypertension -Due to acute kidney injury ARB was held on admission.  -Patient initially was on IV Lopressor and transitioned to Coreg.  -ARB resumed however due to bump in creatinine ARB subsequently discontinued and will not be resumed on discharge.  -Patient started on Norvasc 5 mg daily in addition to home regimen of Coreg for blood pressure control.   -Outpatient follow-up with PCP.   13.  Constipation -Patient with complaints of constipation, states no bowel movement in 2 weeks prior to admission. -Patient placed on MiraLAX twice daily, Senokot-S twice daily and received a smog enema 01/08/2022 and noted to have bowel movement.   -Patient be discharged home on bowel regimen of MiraLAX twice daily, Senokot-S twice daily.   -Outpatient follow-up with PCP.       Procedures:  CT abdomen and pelvis 01/05/2022 Chest x-ray 01/05/2022 Upper endoscopy: 01/09/2022 per Dr. Michail Sermon  Consultations: Gastroenterology: Dr. Michail Sermon 01/06/2022  Discharge Exam: Vitals:   01/10/22 0840 01/10/22 1318  BP: (!) 154/95 (!) 137/57  Pulse: 78 74  Resp: 18 14  Temp: 98.1 F (36.7 C) 97.8 F (36.6 C)  SpO2: 100% 99%    General: NAD Cardiovascular: RRR no murmurs rubs or gallops.  No JVD.  No lower extremity  edema. Respiratory: Clear to auscultation bilaterally.  No wheezes, no crackles, no rhonchi.  Fair air movement.  Speaking in full sentences.  Discharge Instructions   Discharge Instructions     Diet Carb Modified   Complete by: As directed    Increase activity slowly   Complete by: As directed       Allergies as of 01/10/2022       Reactions   Peanut Oil Itching        Medication List     STOP taking these medications    HYDROcodone-acetaminophen 10-325 MG tablet Commonly known as: NORCO   irbesartan 150 MG tablet Commonly known as: AVAPRO   losartan 100 MG tablet Commonly known as: COZAAR   omeprazole 40 MG capsule Commonly known as: PRILOSEC   tizanidine 6 MG capsule Commonly known as: ZANAFLEX       TAKE these medications    Accu-Chek Aviva Plus test strip Generic drug: glucose blood 2 (two) times daily. test blood sugar   Accu-Chek Softclix Lancets lancets 4 (four) times daily.   allopurinol 300 MG tablet Commonly known as: ZYLOPRIM Take 1 tablet (300 mg total) by mouth daily.   amLODipine 5 MG tablet Commonly known as: NORVASC Take 1 tablet (5 mg total) by mouth  daily. Start taking on: January 11, 2022   B-D ULTRAFINE III SHORT PEN 31G X 8 MM Misc Generic drug: Insulin Pen Needle Inject into the skin 4 (four) times daily.   blood glucose meter kit and supplies Dispense based on patient and insurance preference. Use up to four times daily as directed. (FOR ICD-10 E10.9, E11.9).   carvedilol 25 MG tablet Commonly known as: COREG Take 1 tablet (25 mg total) by mouth 2 (two) times daily with a meal.   citalopram 40 MG tablet Commonly known as: CELEXA Take 1 tablet (40 mg total) by mouth daily.   folic acid 1 MG tablet Commonly known as: FOLVITE Take 1 tablet (1 mg total) by mouth daily.   hydrOXYzine 25 MG capsule Commonly known as: VISTARIL Take 25 mg by mouth at bedtime.   Lantus SoloStar 100 UNIT/ML Solostar Pen Generic  drug: insulin glargine Inject 20 Units into the skin at bedtime.   NovoLOG FlexPen 100 UNIT/ML FlexPen Generic drug: insulin aspart Inject 5 Units into the skin 3 (three) times daily with meals.   pantoprazole 40 MG tablet Commonly known as: PROTONIX Take 1 tablet (40 mg total) by mouth 2 (two) times daily for 30 days, THEN 1 tablet (40 mg total) daily. Start taking on: January 10, 2022 What changed: See the new instructions.   Percocet 10-325 MG tablet Generic drug: oxyCODONE-acetaminophen Take 1 tablet by mouth every 4 (four) hours as needed for pain.   polyethylene glycol 17 g packet Commonly known as: MIRALAX / GLYCOLAX Take 17 g by mouth 2 (two) times daily.   senna-docusate 8.6-50 MG tablet Commonly known as: Senokot-S Take 1 tablet by mouth 2 (two) times daily.   sucralfate 1 GM/10ML suspension Commonly known as: CARAFATE Take 10 mLs (1 g total) by mouth 4 (four) times daily -  with meals and at bedtime.   Trulicity 4.5 AC/1.6SA Sopn Generic drug: Dulaglutide SMARTSIG:4.5 Milligram(s) SUB-Q Once a Week   Ubrelvy 100 MG Tabs Generic drug: Ubrogepant Take by mouth.       Allergies  Allergen Reactions   Peanut Oil Itching    Follow-up [facility]. Schedule an appointment as soon as possible for a visit in 2 week(s).   Contact information: [place] [address] [phone]                  The results of significant diagnostics from this hospitalization (including imaging, microbiology, ancillary and laboratory) are listed below for reference.    Significant Diagnostic Studies: US RENAL  Result Date: 01/10/2022 CLINICAL DATA:  Acute renal dysfunction EXAM: RENAL / URINARY TRACT ULTRASOUND COMPLETE COMPARISON:  CT done on 01/05/2022 FINDINGS: Right Kidney: Renal measurements: 12.2 x 5.2 x 6.3 cm = volume: 206.9 mL. There is no hydronephrosis. Cortical echogenicity is unremarkable. Left Kidney: Renal  measurements: 10.4 x 4.4 x 4.8 cm = volume: 115.2 mL. There is no hydronephrosis. Cortical echogenicity is unremarkable. Cyst seen in the left kidney in the previous CT is not visualized in the submitted images. Bladder: Appears normal for degree of bladder distention. Other: None. IMPRESSION: There is no hydronephrosis.  Cortical echogenicity is unremarkable. Electronically Signed   By: Elmer Picker M.D.   On: 01/10/2022 09:46   ECHOCARDIOGRAM COMPLETE  Result Date: 01/06/2022    ECHOCARDIOGRAM REPORT   Patient Name:   Tracey Castillo Date of Exam: 01/06/2022 Medical Rec #:  [id number]  Height:       66.0 in Accession #:    [id number]        Weight:       253.5 lb Date of Birth:  Oct 14, 1960         BSA:          2.212 m Patient Age:    41 years          BP:           158/62 mmHg Patient Gender: F                 HR:           96 bpm. Exam Location:  Inpatient Procedure: 2D Echo and Intracardiac Opacification Agent Indications:    elevated troponin  History:        Patient has prior history of Echocardiogram examinations, most                 recent 08/01/2019. Risk Factors:Hypertension and Dyslipidemia.  Sonographer:    Harvie Junior Referring Phys: [phone] Jarmal Lewelling V Keyontay Stolz  Sonographer Comments: Technically difficult study due to poor echo windows and patient is obese. Image acquisition challenging due to patient body habitus. IMPRESSIONS  1. Left ventricular ejection fraction, by estimation, is 60 to 65%. The left ventricle has normal function. The left ventricle has no regional wall motion abnormalities. There is mild concentric left ventricular hypertrophy. Left ventricular diastolic parameters are indeterminate.  2. Right ventricular systolic function is normal. The right ventricular size is normal. There is normal pulmonary artery systolic pressure.  3. The mitral valve is normal in structure. Trivial mitral valve regurgitation. No evidence of mitral stenosis.  4. Tricuspid aortic valve with  focal calcification on the left cusp. Aortic valve regurgitation is not visualized. Aortic valve sclerosis/calcification is present, without any evidence of aortic stenosis.  5. The inferior vena cava is normal in size with greater than 50% respiratory variability, suggesting right atrial pressure of 3 mmHg. FINDINGS  Left Ventricle: Left ventricular ejection fraction, by estimation, is 60 to 65%. The left ventricle has normal function. The left ventricle has no regional wall motion abnormalities. Definity contrast agent was given IV to delineate the left ventricular  endocardial borders. The left ventricular internal cavity size was normal in size. There is mild concentric left ventricular hypertrophy. Left ventricular diastolic parameters are indeterminate. Right Ventricle: The right ventricular size is normal. No increase in right ventricular wall thickness. Right ventricular systolic function is normal. There is normal pulmonary artery systolic pressure. The tricuspid regurgitant velocity is 2.50 m/s, and  with an assumed right atrial pressure of 3 mmHg, the estimated right ventricular systolic pressure is 71.0 mmHg. Left Atrium: Left atrial size was normal in size. Right Atrium: Right atrial size was normal in size. Pericardium: Trivial pericardial effusion is present. The pericardial effusion is posterior to the left ventricle. Presence of epicardial fat layer. Mitral Valve: The mitral valve is normal in structure. There is mild thickening of the mitral valve leaflet(s). Mild mitral annular calcification. Trivial mitral valve regurgitation. No evidence of mitral valve stenosis. Tricuspid Valve: The tricuspid valve is normal in structure. Tricuspid valve regurgitation is mild . No evidence of tricuspid stenosis. Aortic Valve: Tricuspid aortic valve with focal calcification on the left cusp. Aortic valve regurgitation is not visualized. Aortic valve sclerosis/calcification is present, without any evidence of  aortic stenosis. Aortic valve mean gradient measures 10.0 mmHg. Aortic valve peak gradient measures 17.8 mmHg. Aortic valve area,  by VTI measures 1.81 cm. Pulmonic Valve: The pulmonic valve was normal in structure. Pulmonic valve regurgitation is trivial. No evidence of pulmonic stenosis. Aorta: The aortic root is normal in size and structure. Venous: The inferior vena cava is normal in size with greater than 50% respiratory variability, suggesting right atrial pressure of 3 mmHg. IAS/Shunts: No atrial level shunt detected by color flow Doppler.  LEFT VENTRICLE PLAX 2D LVIDd:         3.90 cm     Diastology LVIDs:         2.40 cm     LV e' medial:    5.98 cm/s LV PW:         1.00 cm     LV E/e' medial:  14.3 LV IVS:        1.10 cm     LV e' lateral:   8.05 cm/s LVOT diam:     1.90 cm     LV E/e' lateral: 10.6 LV SV:         67 LV SV Index:   30 LVOT Area:     2.84 cm  LV Volumes (MOD) LV vol d, MOD A2C: 49.7 ml LV vol d, MOD A4C: 81.2 ml LV vol s, MOD A2C: 19.3 ml LV vol s, MOD A4C: 25.7 ml LV SV MOD A2C:     30.4 ml LV SV MOD A4C:     81.2 ml LV SV MOD BP:      46.4 ml RIGHT VENTRICLE RV Basal diam:  2.70 cm RV Mid diam:    2.40 cm TAPSE (M-mode): 1.9 cm LEFT ATRIUM             Index       RIGHT ATRIUM          Index LA diam:        3.40 cm 1.54 cm/m  RA Area:     7.52 cm LA Vol (A2C):   18.8 ml 8.50 ml/m  RA Volume:   12.10 ml 5.47 ml/m LA Vol (A4C):   15.2 ml 6.87 ml/m LA Biplane Vol: 16.9 ml 7.64 ml/m  AORTIC VALVE                     PULMONIC VALVE AV Area (Vmax):    1.77 cm      PV Vmax:       1.54 m/s AV Area (Vmean):   1.59 cm      PV Peak grad:  9.5 mmHg AV Area (VTI):     1.81 cm AV Vmax:           211.00 cm/s AV Vmean:          144.000 cm/s AV VTI:            0.370 m AV Peak Grad:      17.8 mmHg AV Mean Grad:      10.0 mmHg LVOT Vmax:         132.00 cm/s LVOT Vmean:        81.000 cm/s LVOT VTI:          0.236 m LVOT/AV VTI ratio: 0.64  AORTA Ao Root diam: 2.70 cm Ao Asc diam:  2.70 cm MITRAL  VALVE                TRICUSPID VALVE MV Area (PHT): 3.99 cm     TR Peak grad:   25.0 mmHg MV Decel Time: 190 msec  TR Vmax:        250.00 cm/s MV E velocity: 85.30 cm/s MV A velocity: 101.00 cm/s  SHUNTS MV E/A ratio:  0.84         Systemic VTI:  0.24 m                             Systemic Diam: 1.90 cm Kardie Tobb DO Electronically signed by Berniece Salines DO Signature Date/Time: 01/06/2022/11:26:43 AM    Final    CT ABDOMEN PELVIS WO CONTRAST  Result Date: 01/05/2022 CLINICAL DATA:  Vomiting and epigastric pain for 1 week, initial encounter EXAM: CT ABDOMEN AND PELVIS WITHOUT CONTRAST TECHNIQUE: Multidetector CT imaging of the abdomen and pelvis was performed following the standard protocol without IV contrast. RADIATION DOSE REDUCTION: This exam was performed according to the departmental dose-optimization program which includes automated exposure control, adjustment of the mA and/or kV according to patient size and/or use of iterative reconstruction technique. COMPARISON:  07/30/2019 FINDINGS: Lower chest: No acute abnormality. Hepatobiliary: No focal liver abnormality is seen. Status post cholecystectomy. No biliary dilatation. Pancreas: Unremarkable. No pancreatic ductal dilatation or surrounding inflammatory changes. Spleen: Normal in size without focal abnormality. Adrenals/Urinary Tract: Adrenal glands are within normal limits. Kidneys show no renal calculi or obstructive changes. Small left renal cyst is noted stable from the prior exam. No further follow-up is recommended. No obstructive changes are seen. The bladder is decompressed. Stomach/Bowel: The appendix has been surgically removed. No obstructive or inflammatory changes of the colon are noted. Scattered diverticular changes noted. Small bowel and stomach are within normal limits with the exception of a small hiatal hernia. Vascular/Lymphatic: Aortic atherosclerosis. No enlarged abdominal or pelvic lymph nodes. Reproductive: Status post  hysterectomy. No adnexal masses. Other: No abdominal wall hernia or abnormality. No abdominopelvic ascites. Musculoskeletal: Degenerative changes of lumbar spine are noted. Stable scarring in the anterior abdominal wall on the left is noted. IMPRESSION: Diverticulosis without diverticulitis. No acute abnormality noted. Electronically Signed   By: Inez Catalina M.D.   On: 01/05/2022 02:20   DG Chest Port 1 View  Result Date: 01/05/2022 CLINICAL DATA:  Vomiting for 1 week, epigastric pain, and increasing shortness of breath. EXAM: PORTABLE CHEST 1 VIEW COMPARISON:  07/30/2019. FINDINGS: The heart size and mediastinal contours are within normal limits. There is mild atherosclerotic calcification of the aorta. Elevation of the right diaphragm is noted. No consolidation, effusion, or pneumothorax. Degenerative changes are present in the thoracic spine. No acute osseous abnormality. IMPRESSION: No active disease. Electronically Signed   By: Brett Fairy M.D.   On: 01/05/2022 01:38    Microbiology: Recent Results (from the past 240 hour(s))  Culture, blood (Routine x 2)     Status: None   Collection Time: 01/05/22  1:00 AM   Specimen: BLOOD  Result Value Ref Range Status   Specimen Description   Final    BLOOD LEFT ANTECUBITAL Performed at [facility], [address] [place], [place] [address]    Special Requests   Final    BOTTLES DRAWN AEROBIC AND ANAEROBIC Blood Culture adequate volume Performed at [facility], [address], [place], [place] [address]    Culture   Final    NO GROWTH 5 DAYS Performed at [facility], [place] [address] [place], [place] [address]    Report Status 01/10/2022 FINAL  Final  Culture, blood (single)  Status: None   Collection Time: 01/05/22  9:34 AM   Specimen: BLOOD RIGHT HAND  Result Value Ref Range Status   Specimen Description   Final    BLOOD RIGHT HAND Performed at [facility], [address] [place],  [place] [address]    Special Requests   Final    BOTTLES DRAWN AEROBIC AND ANAEROBIC Blood Culture adequate volume Performed at [facility], [address] [place], [place] [address]    Culture   Final    NO GROWTH 5 DAYS Performed at [facility], [place] [address], [place], [place] [address]    Report Status 01/10/2022 FINAL  Final  SARS Coronavirus 2 by RT PCR (hospital order, performed in [facility] hospital lab) *cepheid single result test* Anterior Nasal Swab     Status: None   Collection Time: 01/05/22  6:36 PM   Specimen: Anterior Nasal Swab  Result Value Ref Range Status   SARS Coronavirus 2 by RT PCR NEGATIVE NEGATIVE Final    Comment: (NOTE) SARS-CoV-2 target nucleic acids are NOT DETECTED.  The SARS-CoV-2 RNA is generally detectable in upper and lower respiratory specimens during the acute phase of infection. The lowest concentration of SARS-CoV-2 viral copies this assay can detect is 250 copies / mL. A negative result does not preclude SARS-CoV-2 infection and should not be used as the sole basis for treatment or other patient management decisions.  A negative result may occur with improper specimen collection / handling, submission of specimen other than nasopharyngeal swab, presence of viral mutation(s) within the areas targeted by this assay, and inadequate number of viral copies (<250 copies / mL). A negative result must be combined with clinical observations, patient history, and epidemiological information.  Fact Sheet for Patients:   https://www.patel.info/  Fact Sheet for Healthcare Providers: https://hall.com/  This test is not yet approved or  cleared by the [place] FDA and has been authorized for detection and/or diagnosis of SARS-CoV-2 by FDA under an Emergency Use Authorization (EUA).  This EUA will remain in effect (meaning this test can be used) for the duration of the COVID-19 declaration  under Section 564(b)(1) of the Act, 21 U.S.C. section 360bbb-3(b)(1), unless the authorization is terminated or revoked sooner.  Performed at [facility], [place] [address], [place], [place] [address]      Labs: Basic Metabolic Panel: Recent Labs  Lab 01/05/22 1830 01/06/22 0435 01/07/22 0514 01/08/22 0523 01/09/22 0513 01/10/22 0453  NA 139 141 141 141 138 139  K 3.9 3.6 3.4* 4.0 4.0 3.9  CL 105 111 113* 113* 113* 114*  CO2 _0 21* 22  GLUCOSE 184* 138* 173* 176* 140* 165*  BUN _1 CREATININE 0.90 0.82 0.85 1.40* 2.02* 2.08*  CALCIUM 9.2 8.7* 8.9 9.1 8.9 8.7*  MG 1.4* 2.3 1.9 1.7 1.9 1.7  PHOS 2.9  --   --   --   --   --    Liver Function Tests: Recent Labs  Lab 01/06/22 0435 01/07/22 0514 01/08/22 0523 01/09/22 0513 01/10/22 0453  AST 94* 76* 60* 55* 51*  ALT 87* 76* 64* 58* 52*  ALKPHOS 110 109 110 109 112  BILITOT 0.9 0.7 0.7 0.6 0.3  PROT 7.5 7.8 7.3 6.8 6.4*  ALBUMIN 3.1* 3.2* 3.1* 2.8* 2.6*   Recent Labs  Lab 01/05/22 1830  LIPASE 33   No results for input(s): "AMMONIA" in  the last 168 hours. CBC: Recent Labs  Lab 01/05/22 0109 01/05/22 0246 01/05/22 1830 01/06/22 0435 01/07/22 0514 01/08/22 0523 01/09/22 0513 01/10/22 0453  WBC 8.9  --  8.7 6.6 5.2 5.7 5.4 5.7  NEUTROABS 6.5  --  5.7 3.5  --   --  2.3  --   HGB 13.9   < > 12.4 11.4* 10.9* 10.9* 10.8* 10.1*  HCT 41.6   < > 38.5 35.4* 33.7* 34.2* 34.4* 32.3*  MCV 86.0  --  88.1 87.8 89.6 90.2 91.2 91.2  PLT 428*  --  371 337 345 306 324 328   < > = values in this interval not displayed.   Cardiac Enzymes: No results for input(s): "CKTOTAL", "CKMB", "CKMBINDEX", "TROPONINI" in the last 168 hours. BNP: BNP (last 3 results) No results for input(s): "BNP" in the last 8760 hours.  ProBNP (last 3 results) No results for input(s): "PROBNP" in the last 8760 hours.  CBG: Recent Labs  Lab 01/09/22 1201 01/09/22 1637 01/09/22 1943 01/10/22 0821  01/10/22 1221  GLUCAP 155* 171* 174* 150* 202*       Signed:  Irine Seal MD.  [facility] 01/10/2022, 4:19 PM

## 2022-01-12 LAB — SURGICAL PATHOLOGY

## 2022-01-13 ENCOUNTER — Other Ambulatory Visit (HOSPITAL_COMMUNITY): Payer: Self-pay

## 2022-02-01 ENCOUNTER — Other Ambulatory Visit (HOSPITAL_COMMUNITY): Payer: Self-pay

## 2022-09-22 ENCOUNTER — Other Ambulatory Visit: Payer: Self-pay

## 2022-09-22 ENCOUNTER — Emergency Department (HOSPITAL_COMMUNITY)
Admission: EM | Admit: 2022-09-22 | Discharge: 2022-09-22 | Disposition: A | Discharge status: Home / Self Care | Payer: Medicaid Other | Attending: Emergency Medicine | Admitting: Emergency Medicine

## 2022-09-22 ENCOUNTER — Emergency Department (HOSPITAL_COMMUNITY): Payer: Medicaid Other

## 2022-09-22 ENCOUNTER — Encounter (HOSPITAL_COMMUNITY): Payer: Self-pay

## 2022-09-22 DIAGNOSIS — I10 Essential (primary) hypertension: Secondary | ICD-10-CM | POA: Diagnosis not present

## 2022-09-22 DIAGNOSIS — R42 Dizziness and giddiness: Secondary | ICD-10-CM | POA: Diagnosis not present

## 2022-09-22 DIAGNOSIS — E119 Type 2 diabetes mellitus without complications: Secondary | ICD-10-CM | POA: Insufficient documentation

## 2022-09-22 DIAGNOSIS — R000 Tachycardia, unspecified: Secondary | ICD-10-CM | POA: Diagnosis not present

## 2022-09-22 DIAGNOSIS — Z7984 Long term (current) use of oral hypoglycemic drugs: Secondary | ICD-10-CM | POA: Insufficient documentation

## 2022-09-22 DIAGNOSIS — Z794 Long term (current) use of insulin: Secondary | ICD-10-CM | POA: Diagnosis not present

## 2022-09-22 DIAGNOSIS — Z79899 Other long term (current) drug therapy: Secondary | ICD-10-CM | POA: Diagnosis not present

## 2022-09-22 DIAGNOSIS — Z91010 Allergy to peanuts: Secondary | ICD-10-CM | POA: Insufficient documentation

## 2022-09-22 DIAGNOSIS — R197 Diarrhea, unspecified: Secondary | ICD-10-CM | POA: Diagnosis present

## 2022-09-22 LAB — COMPREHENSIVE METABOLIC PANEL
ALT: 15 U/L (ref 0–44)
AST: 20 U/L (ref 15–41)
Albumin: 3.7 g/dL (ref 3.5–5.0)
Alkaline Phosphatase: 133 U/L — ABNORMAL HIGH (ref 38–126)
Anion gap: 9 (ref 5–15)
BUN: 15 mg/dL (ref 6–20)
CO2: 21 mmol/L — ABNORMAL LOW (ref 22–32)
Calcium: 8.8 mg/dL — ABNORMAL LOW (ref 8.9–10.3)
Chloride: 104 mmol/L (ref 98–111)
Creatinine, Ser: 0.97 mg/dL (ref 0.44–1.00)
GFR, Estimated: >60 mL/min (ref >60)
Glucose, Bld: 194 mg/dL — ABNORMAL HIGH (ref 70–99)
Potassium: 3.4 mmol/L — ABNORMAL LOW (ref 3.5–5.1)
Sodium: 134 mmol/L — ABNORMAL LOW (ref 135–145)
Total Bilirubin: 0.2 mg/dL — ABNORMAL LOW (ref 0.3–1.2)
Total Protein: 9.3 g/dL — ABNORMAL HIGH (ref 6.5–8.1)

## 2022-09-22 LAB — CBC
HCT: 43.1 % (ref 36.0–46.0)
Hemoglobin: 13.7 g/dL (ref 12.0–15.0)
MCH: 28.2 pg (ref 26.0–34.0)
MCHC: 31.8 g/dL (ref 30.0–36.0)
MCV: 88.7 fL (ref 80.0–100.0)
Platelets: 502 10*3/uL — ABNORMAL HIGH (ref 150–400)
RBC: 4.86 MIL/uL (ref 3.87–5.11)
RDW: 13.6 % (ref 11.5–15.5)
WBC: 6.7 10*3/uL (ref 4.0–10.5)
nRBC: 0 % (ref 0.0–0.2)

## 2022-09-22 LAB — LACTIC ACID, PLASMA: Lactic Acid, Venous: 1.9 mmol/L (ref 0.5–1.9)

## 2022-09-22 LAB — LIPASE, BLOOD: Lipase: 31 U/L (ref 11–51)

## 2022-09-22 MED ORDER — DIAZEPAM 2 MG PO TABS
2.0000 mg | ORAL_TABLET | Freq: Once | ORAL | Status: AC
  Administered 2022-09-22: 2 mg via ORAL
  Filled 2022-09-22: qty 1

## 2022-09-22 MED ORDER — CARMEX CLASSIC LIP BALM EX OINT
TOPICAL_OINTMENT | Freq: Once | CUTANEOUS | Status: AC
  Filled 2022-09-22: qty 10

## 2022-09-22 MED ORDER — CARVEDILOL 12.5 MG PO TABS
25.0000 mg | ORAL_TABLET | ORAL | Status: AC
  Administered 2022-09-22: 25 mg via ORAL
  Filled 2022-09-22: qty 2

## 2022-09-22 MED ORDER — HYDROMORPHONE HCL 1 MG/ML IJ SOLN
0.5000 mg | Freq: Once | INTRAMUSCULAR | Status: AC
  Administered 2022-09-22: 0.5 mg via INTRAVENOUS
  Filled 2022-09-22: qty 1

## 2022-09-22 MED ORDER — IOHEXOL 350 MG/ML SOLN
80.0000 mL | Freq: Once | INTRAVENOUS | Status: AC | PRN
  Administered 2022-09-22: 80 mL via INTRAVENOUS

## 2022-09-22 MED ORDER — SODIUM CHLORIDE 0.9 % IV SOLN: INTRAVENOUS | Status: DC

## 2022-09-22 MED ORDER — SODIUM CHLORIDE 0.9 % IV BOLUS
1000.0000 mL | Freq: Once | INTRAVENOUS | Status: AC
  Administered 2022-09-22: 1000 mL via INTRAVENOUS

## 2022-09-22 MED ORDER — ONDANSETRON HCL 4 MG/2ML IJ SOLN
4.0000 mg | Freq: Once | INTRAMUSCULAR | Status: AC
  Administered 2022-09-22: 4 mg via INTRAVENOUS
  Filled 2022-09-22: qty 2

## 2022-09-22 MED ORDER — ONDANSETRON HCL 8 MG PO TABS: 8.0000 mg | ORAL_TABLET | Freq: Three times a day (TID) | ORAL | 0 refills | Status: AC | PRN

## 2022-09-22 NOTE — Discharge Instructions (Addendum)
The test today in the ED were reassuring.  Return to the emergency room if you have increasing pain, fevers or other concerning symptoms.  The medications for nausea as needed.  Make sure to drink plenty of fluids

## 2022-09-22 NOTE — ED Provider Notes (Signed)
[facility] AT [facility] Provider Note   CSN: [id number] Arrival date & time: 09/22/22  1537     History  Chief Complaint  Patient presents with   Dizziness   Diarrhea    [facility] Castillo is a 62 y.o. female.   Dizziness Associated symptoms: diarrhea   Diarrhea    Patient has a history of hypercholesterolemia diabetes hypertension gout, migraines, reflux, anxiety.  Patient has history of prior abdominal hysterectomy and cholecystectomy.  She presents to the ED with complaints of abdominal pain vomiting diarrhea malaise ongoing for about a week or so.  Patient states she has been having few episodes of vomiting and diarrhea daily for about the last week.  She has not been eating or drinking as much.  Her appetite has been decreased.  She also has been limited somewhat lightheaded and dizzy.  Patient states she went to her doctor's office today for an appointment.  She mentioned the symptoms and was instructed to come to the ED.  At the doctor's office they noted her blood pressure was elevated.  She was also tachycardic and having shortness of breath.  Patient has not noticed any blood in the stool.  She denies any fevers.  Home Medications Prior to Admission medications   Medication Sig Start Date End Date Taking? Authorizing Provider  ondansetron (ZOFRAN) 8 MG tablet Take 1 tablet (8 mg total) by mouth every 8 (eight) hours as needed for nausea or vomiting. 09/22/22  Yes Linwood Dibbles, MD  ACCU-CHEK AVIVA PLUS test strip 2 (two) times daily. test blood sugar 07/08/19   [provider]  Accu-Chek Softclix Lancets lancets 4 (four) times daily. 10/30/21   [provider]  allopurinol (ZYLOPRIM) 300 MG tablet Take 1 tablet (300 mg total) by mouth daily. 01/10/22   Rodolph Bong, MD  amLODipine (NORVASC) 5 MG tablet Take 1 tablet (5 mg total) by mouth daily. 01/11/22   Rodolph Bong, MD  B-D ULTRAFINE III SHORT PEN 31G X 8 MM MISC  Inject into the skin 4 (four) times daily. 09/27/21   [provider]  blood glucose meter kit and supplies Dispense based on patient and insurance preference. Use up to four times daily as directed. (FOR ICD-10 E10.9, E11.9). 08/04/19   Lanae Boast, MD  carvedilol (COREG) 25 MG tablet Take 1 tablet (25 mg total) by mouth 2 (two) times daily with a meal. 01/10/22   Rodolph Bong, MD  citalopram (CELEXA) 40 MG tablet Take 1 tablet (40 mg total) by mouth daily. 01/10/22   Rodolph Bong, MD  folic acid (FOLVITE) 1 MG tablet Take 1 tablet (1 mg total) by mouth daily. 01/10/22   Rodolph Bong, MD  hydrOXYzine (VISTARIL) 25 MG capsule Take 25 mg by mouth at bedtime. 07/30/21   [provider]  insulin aspart (NOVOLOG FLEXPEN) 100 UNIT/ML FlexPen Inject 5 Units into the skin 3 (three) times daily with meals. 08/04/19 01/05/22  Lanae Boast, MD  insulin glargine (LANTUS SOLOSTAR) 100 UNIT/ML Solostar Pen Inject 20 Units into the skin at bedtime. 08/04/19   Lanae Boast, MD  oxyCODONE-acetaminophen (PERCOCET) 10-325 MG tablet Take 1 tablet by mouth every 4 (four) hours as needed for pain.    [provider]  pantoprazole (PROTONIX) 40 MG tablet Take 1 tablet (40 mg total) by mouth 2 (two) times daily for 30 days, THEN 1 tablet (40 mg total) daily. 01/10/22 05/10/22  Rodolph Bong, MD  polyethylene glycol (  MIRALAX / GLYCOLAX) 17 g packet Take 17 g by mouth 2 (two) times daily. 01/10/22   Rodolph Bong, MD  senna-docusate (SENOKOT-S) 8.6-50 MG tablet Take 1 tablet by mouth 2 (two) times daily. 01/10/22   Rodolph Bong, MD  sucralfate (CARAFATE) 1 GM/10ML suspension Take 10 mLs (1 g total) by mouth 4 (four) times daily -  with meals and at bedtime. 01/10/22   Rodolph Bong, MD  TRULICITY 4.5 MG/0.5ML SOPN SMARTSIG:4.5 Milligram(s) SUB-Q Once a Week 11/19/21   [provider]  UBRELVY 100 MG TABS Take by mouth. 12/28/21   [provider]       Allergies    Peanut oil    Review of Systems   Review of Systems  Gastrointestinal:  Positive for diarrhea.  Neurological:  Positive for dizziness.    Physical Exam Updated Vital Signs BP (!) 147/77   Pulse (!) 103   Temp 98.1 F (36.7 C)   Resp (!) 23   Ht 1.676 m (5\' 6" )   Wt 110.7 kg   SpO2 100%   BMI 39.38 kg/m  Physical Exam Vitals and nursing note reviewed.  Constitutional:      Appearance: She is well-developed. She is not diaphoretic.  HENT:     Head: Normocephalic and atraumatic.     Right Ear: External ear normal.     Left Ear: External ear normal.  Eyes:     General: No scleral icterus.       Right eye: No discharge.        Left eye: No discharge.     Conjunctiva/sclera: Conjunctivae normal.  Neck:     Trachea: No tracheal deviation.  Cardiovascular:     Rate and Rhythm: Regular rhythm. Tachycardia present.  Pulmonary:     Effort: Pulmonary effort is normal. No respiratory distress.     Breath sounds: Normal breath sounds. No stridor. No wheezing or rales.  Abdominal:     General: Bowel sounds are normal. There is no distension.     Palpations: Abdomen is soft.     Tenderness: There is no abdominal tenderness. There is no guarding or rebound.  Musculoskeletal:        General: No tenderness or deformity.     Cervical back: Neck supple.  Skin:    General: Skin is warm and dry.     Findings: No rash.  Neurological:     General: No focal deficit present.     Mental Status: She is alert.     Cranial Nerves: No cranial nerve deficit, dysarthria or facial asymmetry.     Sensory: No sensory deficit.     Motor: No abnormal muscle tone or seizure activity.     Coordination: Coordination normal.  Psychiatric:        Mood and Affect: Mood normal.     ED Results / Procedures / Treatments   Labs (all labs ordered are listed, but only abnormal results are displayed) Labs Reviewed  CBC - Abnormal; Notable for the following components:      Result  Value   Platelets 502 (*)    All other components within normal limits  COMPREHENSIVE METABOLIC PANEL - Abnormal; Notable for the following components:   Sodium 134 (*)    Potassium 3.4 (*)    CO2 21 (*)    Glucose, Bld 194 (*)    Calcium 8.8 (*)    Total Protein 9.3 (*)    Alkaline Phosphatase 133 (*)  Total Bilirubin 0.2 (*)    All other components within normal limits  LIPASE, BLOOD  LACTIC ACID, PLASMA    EKG EKG Interpretation Date/Time:  Thursday September 22 2022 15:46:40 EDT Ventricular Rate:  126 PR Interval:  139 QRS Duration:  75 QT Interval:  298 QTC Calculation: 432 R Axis:   66  Text Interpretation: Sinus tachycardia No significant change since last tracing Confirmed by Linwood Dibbles [phone]) on 09/22/2022 4:31:49 PM  Radiology CT Angio Chest PE W and/or Wo Contrast  Result Date: 09/22/2022 CLINICAL DATA:  Pulmonary embolism (PE) suspected, high prob Shortness of breath with exertion. EXAM: CT ANGIOGRAPHY CHEST WITH CONTRAST TECHNIQUE: Multidetector CT imaging of the chest was performed using the standard protocol during bolus administration of intravenous contrast. Multiplanar CT image reconstructions and MIPs were obtained to evaluate the vascular anatomy. RADIATION DOSE REDUCTION: This exam was performed according to the departmental dose-optimization program which includes automated exposure control, adjustment of the mA and/or kV according to patient size and/or use of iterative reconstruction technique. CONTRAST:  80mL OMNIPAQUE IOHEXOL 350 MG/ML SOLN COMPARISON:  Radiograph earlier today, cardiac CT 08/04/2019 FINDINGS: Cardiovascular: There are no filling defects within the pulmonary arteries to suggest pulmonary embolus. The thoracic aorta is normal in caliber, mild atherosclerosis. The heart is normal in size. Minimal coronary artery calcifications. No pericardial effusion. Mediastinum/Nodes: No suspicious adenopathy. No esophageal wall thickening. Lungs/Pleura: No  acute airspace disease. No pleural effusion. No features of pulmonary edema. No suspicious pulmonary nodule. Upper Abdomen: Cholecystectomy.  No acute upper abdominal findings. Musculoskeletal: Thoracic spondylosis with spurring. Incidental bone island within T3. There are no acute or suspicious osseous abnormalities. Review of the MIP images confirms the above findings. IMPRESSION: 1. No pulmonary embolus or acute intrathoracic abnormality. 2. Aortic Atherosclerosis (ICD10-I70.0). Electronically Signed   By: Narda Rutherford M.D.   On: 09/22/2022 18:50   DG Chest 2 View  Result Date: 09/22/2022 CLINICAL DATA:  Dizziness, shortness of breath EXAM: CHEST - 2 VIEW COMPARISON:  01/05/2022 FINDINGS: The heart size and mediastinal contours are within normal limits. Both lungs are clear. The visualized skeletal structures are unremarkable. IMPRESSION: No active cardiopulmonary disease. Electronically Signed   By: Ernie Avena M.D.   On: 09/22/2022 17:13    Procedures Procedures    Medications Ordered in ED Medications  sodium chloride 0.9 % bolus 1,000 mL (0 mLs Intravenous Stopped 09/22/22 1833)    And  0.9 %  sodium chloride infusion ( Intravenous New Bag/Given 09/22/22 1848)  HYDROmorphone (DILAUDID) injection 0.5 mg (0.5 mg Intravenous Given 09/22/22 1645)  ondansetron (ZOFRAN) injection 4 mg (4 mg Intravenous Given 09/22/22 1646)  iohexol (OMNIPAQUE) 350 MG/ML injection 80 mL (80 mLs Intravenous Contrast Given 09/22/22 1837)  lip balm (CARMEX) ointment ( Topical Given 09/22/22 1851)  carvedilol (COREG) tablet 25 mg (25 mg Oral Given 09/22/22 1901)  diazepam (VALIUM) tablet 2 mg (2 mg Oral Given 09/22/22 1901)    ED Course/ Medical Decision Making/ A&P Clinical Course as of 09/22/22 1916  Thu Sep 22, 2022  1821 CBC normal.  Metabolic panel normal.  Lactic acid level normal.  Lipase normal.  Metabolic panel without significant electrolyte abnormalities. [JK]  1821 Chest x-ray without acute  abnormalities [JK]  1853 CT angio chest negative for PE or other acute abnormality [JK]    Clinical Course User Index [JK] Linwood Dibbles, MD  Medical Decision Making Amount and/or Complexity of Data Reviewed Labs: ordered. Radiology: ordered.  Risk OTC drugs. Prescription drug management.   Patient presented to the ED for evaluation of tachycardia and dyspnea on exertion.  Patient was also having some intermittent episodes of diarrhea over the last week some.  Mild abdominal discomfort.  Patient's ED workup is reassuring.  She does not have any leukocytosis.  Her abdominal exam is benign.  No guarding.  She does not have a lactic acidosis or leukocytosis.  Was concerned about her complaints of dyspnea and her tachycardia.  CT angio was performed and there is no evidence of pulmonary embolism.  Patient's vital signs have improved with IV fluids and antiemetics.  Patient was given a dose of her antihypertensive agents and her blood pressure is improved.  Patient has been able to eat a sandwich and drink fluids without difficulty.  Will have her continue her home medications.  Return to the ED for worsening symptoms including abdominal pain.          Final Clinical Impression(s) / ED Diagnoses Final diagnoses:  Hypertension, unspecified type  Diarrhea, unspecified type    Rx / DC Orders ED Discharge Orders          Ordered    ondansetron (ZOFRAN) 8 MG tablet  Every 8 hours PRN        09/22/22 1915              Linwood Dibbles, MD 09/22/22 1919

## 2022-09-22 NOTE — ED Triage Notes (Signed)
Patient BIB EMS for dizziness, diarrhea, and SOB with exertion x 1 week. She reports not drinking much in the last week. Patient is tachy on arrival at 125 and BP 164/107. Hx high blood pressure.
